# Patient Record
Sex: Female | Born: 1945 | Race: White | Hispanic: No | Smoking: Former smoker
Health system: Southern US, Community
[De-identification: ages and names within clinical notes are randomized; demographics above are authoritative.]

## PROBLEM LIST (undated history)

## (undated) DIAGNOSIS — G4730 Sleep apnea, unspecified: Secondary | ICD-10-CM

## (undated) DIAGNOSIS — M8580 Other specified disorders of bone density and structure, unspecified site: Secondary | ICD-10-CM

## (undated) DIAGNOSIS — K219 Gastro-esophageal reflux disease without esophagitis: Secondary | ICD-10-CM

## (undated) DIAGNOSIS — E785 Hyperlipidemia, unspecified: Secondary | ICD-10-CM

## (undated) DIAGNOSIS — C3491 Malignant neoplasm of unspecified part of right bronchus or lung: Secondary | ICD-10-CM

## (undated) DIAGNOSIS — R7301 Impaired fasting glucose: Secondary | ICD-10-CM

## (undated) DIAGNOSIS — R599 Enlarged lymph nodes, unspecified: Secondary | ICD-10-CM

## (undated) DIAGNOSIS — F339 Major depressive disorder, recurrent, unspecified: Secondary | ICD-10-CM

## (undated) DIAGNOSIS — Z8719 Personal history of other diseases of the digestive system: Secondary | ICD-10-CM

## (undated) DIAGNOSIS — J309 Allergic rhinitis, unspecified: Secondary | ICD-10-CM

## (undated) DIAGNOSIS — F419 Anxiety disorder, unspecified: Secondary | ICD-10-CM

## (undated) DIAGNOSIS — F17200 Nicotine dependence, unspecified, uncomplicated: Secondary | ICD-10-CM

## (undated) DIAGNOSIS — R7989 Other specified abnormal findings of blood chemistry: Secondary | ICD-10-CM

## (undated) DIAGNOSIS — Z7189 Other specified counseling: Secondary | ICD-10-CM

## (undated) DIAGNOSIS — E559 Vitamin D deficiency, unspecified: Secondary | ICD-10-CM

## (undated) DIAGNOSIS — J439 Emphysema, unspecified: Secondary | ICD-10-CM

## (undated) DIAGNOSIS — Z923 Personal history of irradiation: Secondary | ICD-10-CM

## (undated) DIAGNOSIS — I8393 Asymptomatic varicose veins of bilateral lower extremities: Secondary | ICD-10-CM

## (undated) HISTORY — DX: Nicotine dependence, unspecified, uncomplicated: F17.200

## (undated) HISTORY — PX: ABDOMINAL HYSTERECTOMY: SHX81

## (undated) HISTORY — DX: Allergic rhinitis, unspecified: J30.9

## (undated) HISTORY — PX: INCONTINENCE SURGERY: SHX676

## (undated) HISTORY — DX: Impaired fasting glucose: R73.01

## (undated) HISTORY — DX: Other specified counseling: Z71.89

## (undated) HISTORY — DX: Major depressive disorder, recurrent, unspecified: F33.9

## (undated) HISTORY — DX: Emphysema, unspecified: J43.9

## (undated) HISTORY — DX: Hyperlipidemia, unspecified: E78.5

## (undated) HISTORY — DX: Other specified disorders of bone density and structure, unspecified site: M85.80

## (undated) HISTORY — DX: Asymptomatic varicose veins of bilateral lower extremities: I83.93

## (undated) HISTORY — DX: Gastro-esophageal reflux disease without esophagitis: K21.9

## (undated) HISTORY — DX: Vitamin D deficiency, unspecified: E55.9

## (undated) HISTORY — DX: Other specified abnormal findings of blood chemistry: R79.89

---

## 2001-03-20 ENCOUNTER — Ambulatory Visit (HOSPITAL_COMMUNITY): Admission: RE | Admit: 2001-03-20 | Discharge: 2001-03-20 | Payer: Self-pay | Admitting: Internal Medicine

## 2001-12-02 ENCOUNTER — Ambulatory Visit (HOSPITAL_COMMUNITY): Admission: RE | Admit: 2001-12-02 | Discharge: 2001-12-02 | Payer: Self-pay | Admitting: Gastroenterology

## 2016-01-15 DIAGNOSIS — C3491 Malignant neoplasm of unspecified part of right bronchus or lung: Secondary | ICD-10-CM

## 2016-01-15 HISTORY — DX: Malignant neoplasm of unspecified part of right bronchus or lung: C34.91

## 2016-06-26 HISTORY — PX: OTHER SURGICAL HISTORY: SHX169

## 2016-08-20 ENCOUNTER — Encounter: Payer: Self-pay | Admitting: *Deleted

## 2016-08-20 ENCOUNTER — Ambulatory Visit (INDEPENDENT_AMBULATORY_CARE_PROVIDER_SITE_OTHER): Payer: Medicare Other | Admitting: Emergency Medicine

## 2016-08-20 ENCOUNTER — Ambulatory Visit (INDEPENDENT_AMBULATORY_CARE_PROVIDER_SITE_OTHER)
Admission: RE | Admit: 2016-08-20 | Discharge: 2016-08-20 | Disposition: A | Discharge status: Home / Self Care | Payer: Medicare Other | Source: Ambulatory Visit | Attending: Emergency Medicine | Admitting: Emergency Medicine

## 2016-08-20 ENCOUNTER — Other Ambulatory Visit: Payer: Self-pay | Admitting: *Deleted

## 2016-08-20 VITALS — BP 120/72 | HR 88 | Ht 65.0 in | Wt 131.0 lb

## 2016-08-20 DIAGNOSIS — C801 Malignant (primary) neoplasm, unspecified: Secondary | ICD-10-CM

## 2016-08-20 DIAGNOSIS — J80 Acute respiratory distress syndrome: Secondary | ICD-10-CM

## 2016-08-20 DIAGNOSIS — J439 Emphysema, unspecified: Secondary | ICD-10-CM | POA: Diagnosis not present

## 2016-08-20 DIAGNOSIS — J441 Chronic obstructive pulmonary disease with (acute) exacerbation: Secondary | ICD-10-CM | POA: Insufficient documentation

## 2016-08-20 DIAGNOSIS — C3411 Malignant neoplasm of upper lobe, right bronchus or lung: Secondary | ICD-10-CM | POA: Insufficient documentation

## 2016-08-20 DIAGNOSIS — J449 Chronic obstructive pulmonary disease, unspecified: Secondary | ICD-10-CM | POA: Insufficient documentation

## 2016-08-20 NOTE — Patient Instructions (Addendum)
We will perform a CXR today We will plan to repeat your CT scan of the chest in December 2018 We will refer you for pulmonary rehab  We will perform a walking oximetry today on RA Please continue your Spiriva daily Take albuterol 2 puffs up to every 4 hours if needed for shortness of breath.  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  Follow with Dr Lamonte Sakai in December after your CT chest to review together.

## 2016-08-20 NOTE — Assessment & Plan Note (Signed)
Diagnosis made with a 1.2 cm isolated right middle lobe nodule. The pathology clearly states that this was small cell, negative nodes and negative margins without any evidence of microscopic lymphatic or vascular invasion. Her notes from [facility] state that she had non-small cell lung cancer. I believe we need to have the slides sent for over read at [facility] to ensure that I am clear about the true diagnosis. I will refer her to see Dr. [name] in oncology given the results above I believe she likely only need surveillance but I would like his opinion regarding any role for adjuvant chemotherapy for small cell. I have ordered a repeat CT scan of the chest for December.

## 2016-08-20 NOTE — Addendum Note (Signed)
Addended by: Desmond Dike C on: 08/20/2016 02:21 PM   Modules accepted: Orders

## 2016-08-20 NOTE — Progress Notes (Signed)
Subjective:    Patient ID: Robyn Barton, female    DOB: March 28, 1945, 71 y.o.   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 She underwent a right middle lobectomy on 06/26/16 by Dr. Lianne Moris at [facility]. Negative station 7 and 11 R nodes, negative surgical margin. Also with a history of mild obstructive sleep apnea not on CPAP. Post-op she had to be admitted to [facility] for progressive dyspnea, treated for possible HCAP vs ARDS, mechanically ventilated, admitted for several weeks.   She is currently managed on Spiriva. Uses albuterol prn >> about 2x a day. She has had more dyspnea since the surgery and the rehospitailzation.  She was d/c on O2 1L/min, has not been using it.   FEV1 from a review of her notes for/20/18 FVC 2.35 L (80% predicted) FEV1 1.77 L (80% predicted), FEV1 to FVC ratio 76%  She is transferring her care to [facility].    Review of Systems  Constitutional: Negative for fever and unexpected weight change.  HENT: Negative for congestion, dental problem, ear pain, nosebleeds, postnasal drip, rhinorrhea, sinus pressure, sneezing, sore throat and trouble swallowing.   Eyes: Negative for redness and itching.  Respiratory: Positive for shortness of breath. Negative for cough, chest tightness and wheezing.   Cardiovascular: Negative for palpitations and leg swelling.  Gastrointestinal: Negative for nausea and vomiting.  Genitourinary: Negative for dysuria.  Musculoskeletal: Negative for joint swelling.  Skin: Negative for rash.  Neurological: Negative for headaches.  Hematological: Does not bruise/bleed easily.  Psychiatric/Behavioral: Negative for dysphoric mood. The patient is not nervous/anxious.     Past Medical History:  Diagnosis Date  . Allergic rhinitis   . Chest pain   . Elevated TSH   . Emphysema lung ([place])   . GERD  (gastroesophageal reflux disease)   . Hyperlipidemia   . Hypoxemia   . IFG (impaired fasting glucose)   . Non-small cell cancer of right lung ([place])   . OSA (obstructive sleep apnea)   . Osteopenia   . Pulmonary nodule   . Recurrent major depressive disorder ([place])   . Tobacco dependence   . Varicose veins of both lower extremities   . Vitamin D deficiency      No family history on file.   Social History   Social History  . Marital status: Divorced    Spouse name: N/A  . Number of children: N/A  . Years of education: N/A   Occupational History  . Not on file.   Social History Main Topics  . Smoking status: Former Smoker    Packs/day: 1.50    Years: 50.00    Types: Cigarettes    Quit date: 06/14/2016  . Smokeless tobacco: Never Used  . Alcohol use Not on file  . Drug use: Unknown  . Sexual activity: Not on file   Other Topics Concern  . Not on file   Social History Narrative  . No narrative on file     No Known Allergies   Outpatient Medications Prior to Visit  Medication Sig Dispense Refill  . albuterol (PROVENTIL HFA;VENTOLIN HFA) 108 (90 Base) MCG/ACT inhaler Inhale 2 puffs into the lungs every 6 (six) hours as needed for wheezing or shortness of breath.    Marland Kitchen albuterol (PROVENTIL) (2.5 MG/3ML) 0.083% nebulizer solution Inhale 2.5 mg into the lungs every 6 (six) hours  as needed.  11  . alendronate (FOSAMAX) 70 MG tablet Take 70 mg by mouth once a week. Take with a full glass of water on an empty stomach.    . Cholecalciferol (VITAMIN D3) 2000 units TABS Take 1 tablet by mouth daily.    . furosemide (LASIX) 20 MG tablet Take 20 mg by mouth daily.    Marland Kitchen omeprazole (PRILOSEC) 40 MG capsule Take 1 capsule by mouth daily.  3  . sertraline (ZOLOFT) 50 MG tablet Take 50 mg by mouth daily.    Marland Kitchen tiotropium (SPIRIVA) 18 MCG inhalation capsule Place 18 mcg into inhaler and inhale daily.     No facility-administered medications prior to visit.         Objective:    Physical Exam Vitals:   08/20/16 1337  BP: 120/72  Pulse: 88  SpO2: 97%  Weight: 131 lb (59.4 kg)  Height: 5\' 5"  (1.651 m)   Gen: Pleasant, well-nourished, in no distress,  normal affect  ENT: No lesions,  mouth clear,  oropharynx clear, no postnasal drip  Neck: No JVD, no stridor  Lungs: No use of accessory muscles, clear without rales or rhonchi  Cardiovascular: RRR, heart sounds normal, no murmur or gallops, no peripheral edema  Musculoskeletal: No deformities, no cyanosis or clubbing  Neuro: alert, non focal  Skin: Warm, no lesions or rashes       Assessment & Plan:  Small cell lung cancer in adult [facility]) Diagnosis made with a 1.2 cm isolated right middle lobe nodule. The pathology clearly states that this was small cell, negative nodes and negative margins without any evidence of microscopic lymphatic or vascular invasion. Her notes from [facility] state that she had non-small cell lung cancer. I believe we need to have the slides sent for over read at [facility] to ensure that I am clear about the true diagnosis. I will refer her to see Dr. [name] in oncology given the results above I believe she likely only need surveillance but I would like his opinion regarding any role for adjuvant chemotherapy for small cell. I have ordered a repeat CT scan of the chest for December.  COPD (chronic obstructive pulmonary disease) (HCC) FEV1 from a review of her notes for/20/18 FVC 2.35 L (80% predicted) FEV1 1.77 L (80% predicted), FEV1 to FVC ratio 76%. We will continue Spiriva and albuterol at this time. Walking oximetry today to confirm that she does not still need submental oxygen. I believe she would benefit from a referral for pulmonary rehabilitation  ARDS (adult respiratory distress syndrome) ([place]) She was admitted to [facility] for proximally one month after her hospitalization for right middle lobeectomy. I don't have these records available at this time but  it sounds like this was for ARDS. Certainly if that is the case then it's not surprising that she hasn't returned to her previous baseline. I will check a chest x-ray today. Get the records from [facility]. Refer her for pulmonary rehabilitation  Baltazar Apo, MD, PhD 08/20/2016, 2:18 PM [place] [facility] [phone] or if no answer [phone]

## 2016-08-20 NOTE — Assessment & Plan Note (Signed)
She was admitted to [facility] for proximally one month after her hospitalization for right middle lobeectomy. I don't have these records available at this time but it sounds like this was for ARDS. Certainly if that is the case then it's not surprising that she hasn't returned to her previous baseline. I will check a chest x-ray today. Get the records from [facility]. Refer her for pulmonary rehabilitation

## 2016-08-20 NOTE — Assessment & Plan Note (Signed)
FEV1 from a review of her notes for/20/18 FVC 2.35 L (80% predicted) FEV1 1.77 L (80% predicted), FEV1 to FVC ratio 76%. We will continue Spiriva and albuterol at this time. Walking oximetry today to confirm that she does not still need submental oxygen. I believe she would benefit from a referral for pulmonary rehabilitation

## 2016-08-22 ENCOUNTER — Telehealth: Payer: Self-pay | Admitting: Emergency Medicine

## 2016-08-22 DIAGNOSIS — J449 Chronic obstructive pulmonary disease, unspecified: Secondary | ICD-10-CM

## 2016-08-22 NOTE — Telephone Encounter (Signed)
Pt is requesting order for O2 to be signed ASAP- her billing cycle starts again on 8/13 (monday) and needs this order signed today so her O2 can be picked up tomorrow.  RB is out of town so he cannot sign his order.   Sending to [place] please advise if we can place order under your name to sign asap.  O2 was d/c'ed in yesterday's office visit.  Thanks!

## 2016-08-22 NOTE — Telephone Encounter (Signed)
Per CY- ok to order under his name. Order placed with urgent status to expedite this process for pt.  Routing back to CY to sign order.

## 2016-08-22 NOTE — Telephone Encounter (Signed)
done

## 2016-08-22 NOTE — Telephone Encounter (Signed)
Called office at number provided and was told no [place] works at there office. Verified with the front staff that this was the name given and they stated that this was correct. Called office back and they do not have any phone records of anyone requesting records or notes that someone was trying to call us. Will close this message as nothing further can be done

## 2016-08-23 ENCOUNTER — Telehealth: Payer: Self-pay | Admitting: *Deleted

## 2016-08-23 ENCOUNTER — Encounter: Payer: Self-pay | Admitting: Internal Medicine

## 2016-08-30 ENCOUNTER — Other Ambulatory Visit: Payer: Self-pay | Admitting: *Deleted

## 2016-08-30 DIAGNOSIS — C3490 Malignant neoplasm of unspecified part of unspecified bronchus or lung: Secondary | ICD-10-CM

## 2016-09-02 ENCOUNTER — Encounter: Payer: Self-pay | Admitting: Internal Medicine

## 2016-09-02 ENCOUNTER — Other Ambulatory Visit (HOSPITAL_BASED_OUTPATIENT_CLINIC_OR_DEPARTMENT_OTHER): Payer: Medicare Other

## 2016-09-02 ENCOUNTER — Telehealth: Payer: Self-pay | Admitting: Internal Medicine

## 2016-09-02 ENCOUNTER — Ambulatory Visit (HOSPITAL_BASED_OUTPATIENT_CLINIC_OR_DEPARTMENT_OTHER): Payer: Medicare Other | Admitting: Internal Medicine

## 2016-09-02 VITALS — BP 159/74 | HR 85 | Temp 97.7°F | Resp 19 | Ht 65.0 in | Wt 134.8 lb

## 2016-09-02 DIAGNOSIS — C3411 Malignant neoplasm of upper lobe, right bronchus or lung: Secondary | ICD-10-CM

## 2016-09-02 DIAGNOSIS — C3490 Malignant neoplasm of unspecified part of unspecified bronchus or lung: Secondary | ICD-10-CM

## 2016-09-02 DIAGNOSIS — J449 Chronic obstructive pulmonary disease, unspecified: Secondary | ICD-10-CM

## 2016-09-02 LAB — COMPREHENSIVE METABOLIC PANEL
ALBUMIN: 3.6 g/dL (ref 3.5–5.0)
ALK PHOS: 70 U/L (ref 40–150)
ALT: 15 U/L (ref 0–55)
ANION GAP: 5 meq/L (ref 3–11)
AST: 15 U/L (ref 5–34)
BILIRUBIN TOTAL: 0.27 mg/dL (ref 0.20–1.20)
BUN: 16.8 mg/dL (ref 7.0–26.0)
CO2: 27 meq/L (ref 22–29)
Calcium: 9.8 mg/dL (ref 8.4–10.4)
Chloride: 104 mEq/L (ref 98–109)
Creatinine: 0.9 mg/dL (ref 0.6–1.1)
EGFR: 60 mL/min/{1.73_m2} — AB (ref >90)
GLUCOSE: 98 mg/dL (ref 70–140)
POTASSIUM: 4.5 meq/L (ref 3.5–5.1)
SODIUM: 137 meq/L (ref 136–145)
Total Protein: 7.4 g/dL (ref 6.4–8.3)

## 2016-09-02 LAB — CBC WITH DIFFERENTIAL/PLATELET
BASO%: 0.1 % (ref 0.0–2.0)
BASOS ABS: 0 10*3/uL (ref 0.0–0.1)
EOS%: 1.9 % (ref 0.0–7.0)
Eosinophils Absolute: 0.1 10*3/uL (ref 0.0–0.5)
HCT: 37.5 % (ref 34.8–46.6)
HGB: 12.3 g/dL (ref 11.6–15.9)
LYMPH%: 30.4 % (ref 14.0–49.7)
MCH: 29.6 pg (ref 25.1–34.0)
MCHC: 32.8 g/dL (ref 31.5–36.0)
MCV: 90.1 fL (ref 79.5–101.0)
MONO#: 0.8 10*3/uL (ref 0.1–0.9)
MONO%: 10.9 % (ref 0.0–14.0)
NEUT#: 4.2 10*3/uL (ref 1.5–6.5)
NEUT%: 56.7 % (ref 38.4–76.8)
PLATELETS: 249 10*3/uL (ref 145–400)
RBC: 4.16 10*6/uL (ref 3.70–5.45)
RDW: 14.6 % — ABNORMAL HIGH (ref 11.2–14.5)
WBC: 7.4 10*3/uL (ref 3.9–10.3)
lymph#: 2.3 10*3/uL (ref 0.9–3.3)

## 2016-09-02 NOTE — Telephone Encounter (Signed)
Gave pt avs and calendar with upcoming appts.

## 2016-09-02 NOTE — Progress Notes (Signed)
[place] [phone]   [phone]  CONSULT NOTE  REFERRING PHYSICIAN: Dr. Baltazar Apo  REASON FOR CONSULTATION:  71 years old white female recently diagnosed with lung cancer.  HPI Robyn Barton is a 71 y.o. female was past medical history significant for COPD, depression, dyslipidemia, bladder surgery as well as recent history of lung cancer status post right upper lobectomy. The patient mentioned that she had a pulmonary nodule in the right lung that has been followed by CT scan at annual basis. She had repeat CT scan of the chest on 04/05/2016 and it showed increase in the size of the right middle lobe pulmonary nodule which measured 1.2 cm at that time suspicious for primary bronchogenic carcinoma. Her initial evaluation was at the [facility]. She had a PET scan on 05/14/2016 and it showed hypermetabolic right middle lobe nodule consistent with primary lung neoplasm and measuring 1.3 cm. The patient was referred to Dr. Janey Genta at [facility] and she underwent right upper lobectomy on 06/26/2016 and the final pathology was consistent with small cell lung cancer measuring 1.6 cm in size. There was evidence for visceral pleural invasion but the dissected lymph nodes were negative for malignancy.  According to the patient she was not seen by medical oncology to discuss her future treatment options and management.  She was seen recently by her pulmonologist Dr. Lamonte Sakai and he kindly referred the patient to me today for reevaluation and discussion of her management for the recently diagnosed small cell lung cancer.  When seen today the patient is feeling fine except for shortness breath with exertion as well as intermittent sharp pain on the right side of the chest close to the breast area. This is started 2 weeks ago. She denied having any cough or hemoptysis. She denied having any weight loss or night sweats. She has no nausea,  vomiting, diarrhea or constipation. She denied having any headache or visual changes.  Family history significant for a father died from old age at age 26, mother died at age 45.  The patient is divorced and has 2 children. 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 She has a history of smoking 1.5 pack per day for around 50 years and quit in May 2018. She drinks alcohol occasionally and no history of drug abuse.  HPI  Past Medical History:  Diagnosis Date  . Allergic rhinitis   . Chest pain   . Elevated TSH   . Emphysema lung ([place])   . GERD (gastroesophageal reflux disease)   . Hyperlipidemia   . Hypoxemia   . IFG (impaired fasting glucose)   . Non-small cell cancer of right lung ([place])   . OSA (obstructive sleep apnea)   . Osteopenia   . Pulmonary nodule   . Recurrent major depressive disorder ([place])   . Tobacco dependence   . Varicose veins of both lower extremities   . Vitamin D deficiency     History reviewed. No pertinent surgical history.  History reviewed. No pertinent family history.  Social History Social History  Substance Use Topics  . Smoking status: Former Smoker    Packs/day: 1.50    Years: 50.00    Types: Cigarettes    Quit date: 06/14/2016  . Smokeless tobacco: Never Used  . Alcohol use Not on file    No Known Allergies  Current Outpatient Prescriptions  Medication Sig Dispense Refill  . albuterol (PROVENTIL HFA;VENTOLIN  HFA) 108 (90 Base) MCG/ACT inhaler Inhale 2 puffs into the lungs every 6 (six) hours as needed for wheezing or shortness of breath.    Marland Kitchen alendronate (FOSAMAX) 70 MG tablet Take 70 mg by mouth once a week. Take with a full glass of water on an empty stomach.    Marland Kitchen atorvastatin (LIPITOR) 40 MG tablet Take 40 mg by mouth daily.    . Cholecalciferol (VITAMIN D) 2000 units tablet Take 2,000 Units by mouth daily.    . furosemide (LASIX) 20 MG tablet Take 20 mg by mouth daily.    Marland Kitchen omeprazole (PRILOSEC) 40 MG capsule Take  1 capsule by mouth daily.  3  . sertraline (ZOLOFT) 50 MG tablet Take 50 mg by mouth daily.    Marland Kitchen tiotropium (SPIRIVA) 18 MCG inhalation capsule Place 18 mcg into inhaler and inhale daily.    Marland Kitchen albuterol (PROVENTIL) (2.5 MG/3ML) 0.083% nebulizer solution Inhale 2.5 mg into the lungs every 6 (six) hours as needed.  11   No current facility-administered medications for this visit.     Review of Systems  Constitutional: negative Eyes: negative Ears, nose, mouth, throat, and face: negative Respiratory: positive for dyspnea on exertion and pleurisy/chest pain Cardiovascular: negative Gastrointestinal: negative Genitourinary:negative Integument/breast: negative Hematologic/lymphatic: negative Musculoskeletal:negative Neurological: negative Behavioral/Psych: negative Endocrine: negative Allergic/Immunologic: negative  Physical Exam  GYI:RSWNI, healthy, no distress, well nourished, well developed and anxious SKIN: skin color, texture, turgor are normal, no rashes or significant lesions HEAD: Normocephalic, No masses, lesions, tenderness or abnormalities EYES: normal, PERRLA, Conjunctiva are pink and non-injected EARS: External ears normal, Canals clear OROPHARYNX:no exudate, no erythema and lips, buccal mucosa, and tongue normal  NECK: supple, no adenopathy, no JVD LYMPH:  no palpable lymphadenopathy, no hepatosplenomegaly BREAST:not examined LUNGS: clear to auscultation , and palpation HEART: regular rate & rhythm, no murmurs and no gallops ABDOMEN:abdomen soft, non-tender, normal bowel sounds and no masses or organomegaly BACK: Back symmetric, no curvature., No CVA tenderness EXTREMITIES:no joint deformities, effusion, or inflammation, no edema, no skin discoloration  NEURO: alert & oriented x 3 with fluent speech, no focal motor/sensory deficits  PERFORMANCE STATUS: ECOG 1  LABORATORY DATA: Lab Results  Component Value Date   WBC 7.4 09/02/2016   HGB 12.3 09/02/2016   HCT  37.5 09/02/2016   MCV 90.1 09/02/2016   PLT 249 09/02/2016      Chemistry      Component Value Date/Time   NA 137 09/02/2016 1327   K 4.5 09/02/2016 1327   CO2 27 09/02/2016 1327   BUN 16.8 09/02/2016 1327   CREATININE 0.9 09/02/2016 1327      Component Value Date/Time   CALCIUM 9.8 09/02/2016 1327   ALKPHOS 70 09/02/2016 1327   AST 15 09/02/2016 1327   ALT 15 09/02/2016 1327   BILITOT 0.27 09/02/2016 1327       RADIOGRAPHIC STUDIES: Dg Chest 2 View  Result Date: 08/20/2016 CLINICAL DATA:  Shortness of breath EXAM: CHEST  2 VIEW COMPARISON:  Chest radiograph 07/18/2016 FINDINGS: Postsurgical changes of right middle lobectomy. No focal airspace disease. Normal cardiomediastinal contours. Aeration is greatly improved compared to 07/18/2016. IMPRESSION: No active cardiopulmonary disease. Findings of prior right middle lobectomy. Electronically Signed   By: Ulyses Jarred M.D.   On: 08/20/2016 18:07    ASSESSMENT: This is a very pleasant 72 years old white female recently diagnosed with a stage IB (T2, N0, M0) small cell lung cancer presented with right upper lobe pulmonary nodule is status post  right upper lobectomy with lymph node dissection and the tumor measured 1.6 cm but it involved the visceral pleura. This was diagnosed in June 2018.   PLAN: I had a lengthy discussion with the patient today about her current disease stage, prognosis and treatment options. I explained to the patient that she has very aggressive disease compared to the other types of lung cancer. Also explained to the patient that the risk of recurrence is high with a small cell lung cancer. She received a very appropriate treatment with surgical resection. This was performed more than 2 months ago. I'm not sure if the patient would benefit from adjuvant systemic chemotherapy at this point taken into consideration the small size of her tumor and also the long period of time since her surgical resection. The patient  may not also handle aggressive systemic chemotherapy well with her current age and comorbidities. I recommended for her to continue on observation for now with close monitoring. I will arrange for the patient to have repeat CT scan of the chest as well as MRI of the brain performed next week to rule out any disease recurrence or metastatic disease to the brain since she did not have any imaging studies of the brain before her surgery. If her scans are negative, I would see her back for follow-up visit in December after repeating CT scan of the chest which was already scheduled by Dr. Lamonte Sakai. The patient agreed to the current plan. I strongly encouraged the patient to continue quitting smoking at this point. She was advised to call immediately if she has any concerning symptoms in the interval. The patient voices understanding of current disease status and treatment options and is in agreement with the current care plan.  All questions were answered. The patient knows to call the clinic with any problems, questions or concerns. We can certainly see the patient much sooner if necessary.  Thank you so much for allowing me to participate in the care of Robyn Barton. I will continue to follow up the patient with you and assist in her care.  I spent 40 minutes counseling the patient face to face. The total time spent in the appointment was 60 minutes.  Disclaimer: This note was dictated with voice recognition software. Similar sounding words can inadvertently be transcribed and may not be corrected upon review.   Robyn Barton September 02, 2016, 2:50 PM

## 2016-09-09 ENCOUNTER — Other Ambulatory Visit: Payer: Self-pay | Admitting: Internal Medicine

## 2016-09-09 DIAGNOSIS — C3490 Malignant neoplasm of unspecified part of unspecified bronchus or lung: Secondary | ICD-10-CM

## 2016-09-09 DIAGNOSIS — J449 Chronic obstructive pulmonary disease, unspecified: Secondary | ICD-10-CM

## 2016-09-12 ENCOUNTER — Ambulatory Visit (HOSPITAL_COMMUNITY): Payer: Medicare Other

## 2016-09-14 ENCOUNTER — Ambulatory Visit (HOSPITAL_BASED_OUTPATIENT_CLINIC_OR_DEPARTMENT_OTHER)
Admission: RE | Admit: 2016-09-14 | Discharge: 2016-09-14 | Disposition: A | Discharge status: Home / Self Care | Payer: Medicare Other | Source: Ambulatory Visit | Attending: Internal Medicine | Admitting: Internal Medicine

## 2016-09-14 ENCOUNTER — Encounter (HOSPITAL_BASED_OUTPATIENT_CLINIC_OR_DEPARTMENT_OTHER): Payer: Self-pay

## 2016-09-14 DIAGNOSIS — G319 Degenerative disease of nervous system, unspecified: Secondary | ICD-10-CM | POA: Diagnosis not present

## 2016-09-14 DIAGNOSIS — C3490 Malignant neoplasm of unspecified part of unspecified bronchus or lung: Secondary | ICD-10-CM

## 2016-09-14 DIAGNOSIS — R911 Solitary pulmonary nodule: Secondary | ICD-10-CM | POA: Insufficient documentation

## 2016-09-14 DIAGNOSIS — R590 Localized enlarged lymph nodes: Secondary | ICD-10-CM | POA: Insufficient documentation

## 2016-09-14 DIAGNOSIS — R9082 White matter disease, unspecified: Secondary | ICD-10-CM | POA: Insufficient documentation

## 2016-09-14 DIAGNOSIS — J449 Chronic obstructive pulmonary disease, unspecified: Secondary | ICD-10-CM | POA: Diagnosis not present

## 2016-09-14 DIAGNOSIS — I2510 Atherosclerotic heart disease of native coronary artery without angina pectoris: Secondary | ICD-10-CM | POA: Insufficient documentation

## 2016-09-14 DIAGNOSIS — I700 Atherosclerosis of aorta: Secondary | ICD-10-CM | POA: Insufficient documentation

## 2016-09-14 MED ORDER — GADOBENATE DIMEGLUMINE 529 MG/ML IV SOLN
10.0000 mL | Freq: Once | INTRAVENOUS | Status: AC | PRN
  Administered 2016-09-14: 10 mL via INTRAVENOUS

## 2016-09-14 MED ORDER — IOPAMIDOL (ISOVUE-300) INJECTION 61%
100.0000 mL | Freq: Once | INTRAVENOUS | Status: AC | PRN
Start: 2016-09-14 — End: 2016-09-14
  Administered 2016-09-14: 80 mL via INTRAVENOUS

## 2016-09-15 ENCOUNTER — Other Ambulatory Visit: Payer: Self-pay | Admitting: Internal Medicine

## 2016-09-15 DIAGNOSIS — C3490 Malignant neoplasm of unspecified part of unspecified bronchus or lung: Secondary | ICD-10-CM

## 2016-09-17 ENCOUNTER — Telehealth: Payer: Self-pay | Admitting: Internal Medicine

## 2016-09-17 NOTE — Telephone Encounter (Signed)
Scheduled appt per 9/2 sch message - left message with appt date and time and sent reminder letter in the mail [facility] to contact patient with PET schedule.

## 2016-09-19 ENCOUNTER — Telehealth: Payer: Self-pay | Admitting: Medical Oncology

## 2016-09-19 NOTE — Telephone Encounter (Signed)
Per [name] I told pt that the reason for the PET scan is  "New subcarinal mediastinal lymphadenopathy, worrisome for metastatic nodal recurrence. ".

## 2016-09-26 ENCOUNTER — Ambulatory Visit (HOSPITAL_COMMUNITY)
Admission: RE | Admit: 2016-09-26 | Discharge: 2016-09-26 | Disposition: A | Discharge status: Home / Self Care | Payer: Medicare Other | Source: Ambulatory Visit | Attending: Internal Medicine | Admitting: Internal Medicine

## 2016-09-26 ENCOUNTER — Encounter (HOSPITAL_COMMUNITY): Payer: Self-pay | Admitting: Radiology

## 2016-09-26 DIAGNOSIS — I700 Atherosclerosis of aorta: Secondary | ICD-10-CM | POA: Diagnosis not present

## 2016-09-26 DIAGNOSIS — R590 Localized enlarged lymph nodes: Secondary | ICD-10-CM | POA: Insufficient documentation

## 2016-09-26 DIAGNOSIS — C3490 Malignant neoplasm of unspecified part of unspecified bronchus or lung: Secondary | ICD-10-CM | POA: Diagnosis not present

## 2016-09-26 LAB — GLUCOSE, CAPILLARY: GLUCOSE-CAPILLARY: 132 mg/dL — AB (ref 65–99)

## 2016-09-26 MED ORDER — FLUDEOXYGLUCOSE F - 18 (FDG) INJECTION
7.7000 | Freq: Once | INTRAVENOUS | Status: AC | PRN
  Administered 2016-09-26: 7.7 via INTRAVENOUS

## 2016-10-01 ENCOUNTER — Encounter: Payer: Self-pay | Admitting: Internal Medicine

## 2016-10-01 ENCOUNTER — Telehealth: Payer: Self-pay | Admitting: Internal Medicine

## 2016-10-01 ENCOUNTER — Other Ambulatory Visit: Payer: Self-pay | Admitting: Medical Oncology

## 2016-10-01 ENCOUNTER — Ambulatory Visit (HOSPITAL_BASED_OUTPATIENT_CLINIC_OR_DEPARTMENT_OTHER): Payer: Medicare Other | Admitting: Internal Medicine

## 2016-10-01 VITALS — BP 161/73 | HR 83 | Temp 98.2°F | Resp 18 | Ht 65.0 in | Wt 142.8 lb

## 2016-10-01 DIAGNOSIS — C3490 Malignant neoplasm of unspecified part of unspecified bronchus or lung: Secondary | ICD-10-CM

## 2016-10-01 DIAGNOSIS — C3411 Malignant neoplasm of upper lobe, right bronchus or lung: Secondary | ICD-10-CM

## 2016-10-01 DIAGNOSIS — J449 Chronic obstructive pulmonary disease, unspecified: Secondary | ICD-10-CM

## 2016-10-01 NOTE — Progress Notes (Signed)
[place] [phone]   [phone]  OFFICE PROGRESS NOTE  [name], MD [address] 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  PRIOR THERAPY: right upper lobectomy on 06/26/2016 and the final pathology was consistent with small cell lung cancer measuring 1.6 cm in size.  CURRENT THERAPY: Observation.  INTERVAL HISTORY: Robyn Barton 71 y.o. female returns to the clinic today for follow-up visit. The patient is feeling fine today. She was complaining of increasing fatigue and weakness recently secondary to urinary tract infection. She is finishing a course of antibiotics for her infection. She denied having any chest pain, shortness of breath, cough or hemoptysis. She denied having any fever or chills. She has no nausea, vomiting, diarrhea or constipation. The patient denied having any significant weight loss or night sweats. She had repeat imaging studies recently including CT scan of the chest that showed questionable disease recurrence in the subcarinal area. I ordered a PET scan which was performed recently and the patient is here today for evaluation and discussion of her PET scan results and treatment options.  MEDICAL HISTORY: Past Medical History:  Diagnosis Date  . Allergic rhinitis   . Chest pain   . Elevated TSH   . Emphysema lung ([place])   . GERD (gastroesophageal reflux disease)   . Hyperlipidemia   . Hypoxemia   . IFG (impaired fasting glucose)   . Non-small cell cancer of right lung ([place])   . OSA (obstructive sleep apnea)   . Osteopenia   . Pulmonary nodule   . Recurrent major depressive disorder ([place])   . Tobacco dependence   . Varicose veins of both lower extremities   . Vitamin D deficiency     ALLERGIES:  has No Known Allergies.  MEDICATIONS:  Current Outpatient  Prescriptions  Medication Sig Dispense Refill  . albuterol (PROVENTIL HFA;VENTOLIN HFA) 108 (90 Base) MCG/ACT inhaler Inhale 2 puffs into the lungs every 6 (six) hours as needed for wheezing or shortness of breath.    Marland Kitchen albuterol (PROVENTIL) (2.5 MG/3ML) 0.083% nebulizer solution Inhale 2.5 mg into the lungs every 6 (six) hours as needed.  11  . alendronate (FOSAMAX) 70 MG tablet Take 70 mg by mouth once a week. Take with a full glass of water on an empty stomach.    Marland Kitchen atorvastatin (LIPITOR) 40 MG tablet Take 40 mg by mouth daily.    . cefUROXime (CEFTIN) 250 MG tablet Take 250 mg by mouth.    . Cholecalciferol (VITAMIN D) 2000 units tablet Take 2,000 Units by mouth daily.    Marland Kitchen omeprazole (PRILOSEC) 40 MG capsule Take 1 capsule by mouth daily.  3  . sertraline (ZOLOFT) 50 MG tablet Take 100 mg by mouth daily.     Marland Kitchen tiotropium (SPIRIVA) 18 MCG inhalation capsule Place 18 mcg into inhaler and inhale daily.     No current facility-administered medications for this visit.     SURGICAL HISTORY: History reviewed. No pertinent surgical history.  REVIEW OF SYSTEMS:  Constitutional: negative Eyes: negative Ears, nose, mouth, throat, and face: negative Respiratory: negative Cardiovascular: negative Gastrointestinal: negative Genitourinary:negative Integument/breast: negative Hematologic/lymphatic: negative Musculoskeletal:negative Neurological: negative Behavioral/Psych: negative Endocrine: negative Allergic/Immunologic: negative   PHYSICAL EXAMINATION: General appearance: alert, cooperative, fatigued and no distress Head: Normocephalic, without obvious abnormality, atraumatic Neck: no adenopathy, no JVD, supple, symmetrical,  trachea midline and thyroid not enlarged, symmetric, no tenderness/mass/nodules Lymph nodes: Cervical, supraclavicular, and axillary nodes normal. Resp: clear to auscultation bilaterally Back: symmetric, no curvature. ROM normal. No CVA tenderness. Cardio: regular  rate and rhythm, S1, S2 normal, no murmur, click, rub or gallop GI: soft, non-tender; bowel sounds normal; no masses,  no organomegaly Extremities: extremities normal, atraumatic, no cyanosis or edema Neurologic: Alert and oriented X 3, normal strength and tone. Normal symmetric reflexes. Normal coordination and gait  ECOG PERFORMANCE STATUS: 1 - Symptomatic but completely ambulatory  Blood pressure (!) 161/73, pulse 83, temperature 98.2 F (36.8 C), temperature source Oral, resp. rate 18, height 5\' 5"  (1.651 m), weight 142 lb 12.8 oz (64.8 kg), SpO2 98 %.  LABORATORY DATA: Lab Results  Component Value Date   WBC 7.4 09/02/2016   HGB 12.3 09/02/2016   HCT 37.5 09/02/2016   MCV 90.1 09/02/2016   PLT 249 09/02/2016      Chemistry      Component Value Date/Time   NA 137 09/02/2016 1327   K 4.5 09/02/2016 1327   CO2 27 09/02/2016 1327   BUN 16.8 09/02/2016 1327   CREATININE 0.9 09/02/2016 1327      Component Value Date/Time   CALCIUM 9.8 09/02/2016 1327   ALKPHOS 70 09/02/2016 1327   AST 15 09/02/2016 1327   ALT 15 09/02/2016 1327   BILITOT 0.27 09/02/2016 1327       RADIOGRAPHIC STUDIES: Ct Chest W Contrast  Result Date: 09/14/2016 CLINICAL DATA:  Status post right middle lobectomy 06/26/2016 for stage IB small cell lung carcinoma. Restaging. EXAM: CT CHEST WITH CONTRAST TECHNIQUE: Multidetector CT imaging of the chest was performed during intravenous contrast administration. CONTRAST:  50mL ISOVUE-300 IOPAMIDOL (ISOVUE-300) INJECTION 61% COMPARISON:  07/05/2016 chest CT.  08/20/2016 chest radiograph. FINDINGS: Cardiovascular: Normal heart size. No significant pericardial fluid/thickening. Left anterior descending coronary atherosclerosis. Atherosclerotic nonaneurysmal thoracic aorta. Normal caliber pulmonary arteries. No central pulmonary emboli. Mediastinum/Nodes: No discrete thyroid nodules. Unremarkable esophagus. No axillary adenopathy. New enlarged 1.9 cm subcarinal node  (series 2/ image 66). Mildly enlarged 1.3 cm AP window node (series 2/ image 52), stable. No additional pathologically enlarged mediastinal or hilar nodes. Lungs/Pleura: Status post right middle lobectomy. No pneumothorax. No pleural effusion. Mild centrilobular and paraseptal emphysema with mild diffuse bronchial wall thickening. Posterior right upper lobe sub solid subpleural 1.1 x 0.4 cm pulmonary nodule (series 3/ image 52), obscured by consolidation on 07/05/2016 chest CT. Relatively symmetric subpleural biapical reticulonodular opacities most compatible with pleural-parenchymal scarring. No acute consolidative airspace disease, lung masses or additional significant pulmonary nodules. Previously visualized extensive consolidation, ground-glass attenuation and interlobular septal thickening on 07/05/2016 chest CT has resolved. Upper abdomen: No acute abnormality.  No discrete adrenal nodules. Musculoskeletal: No aggressive appearing focal osseous lesions. Moderate thoracic spondylosis. IMPRESSION: 1. New subcarinal mediastinal lymphadenopathy, worrisome for metastatic nodal recurrence. Consider further characterization with PET-CT and/or tissue sampling. 2. Right middle lobectomy. Subsolid subpleural posterior right upper lobe 1.1 x 0.4 cm pulmonary nodule, obscured by consolidation on prior chest CT, recurrent malignancy not excluded. Attention recommended on short-term follow-up chest CT in 3 months for this finding. 3. One vessel coronary atherosclerosis. Aortic Atherosclerosis (ICD10-I70.0) and Emphysema (ICD10-J43.9). Electronically Signed   By: Ilona Sorrel M.D.   On: 09/14/2016 14:50   Mr Jeri Cos OZ Contrast  Result Date: 09/14/2016 CLINICAL DATA:  Small cell lung cancer diagnosed 6 months ago. COPD. EXAM: MRI HEAD WITHOUT AND WITH CONTRAST TECHNIQUE: Multiplanar, multiecho pulse sequences  of the brain and surrounding structures were obtained without and with intravenous contrast. CONTRAST:  38mL  MULTIHANCE GADOBENATE DIMEGLUMINE 529 MG/ML IV SOLN COMPARISON:  None. FINDINGS: Brain: The diffusion-weighted images demonstrate no acute or subacute infarction. Periventricular and subcortical T2 changes bilaterally are mildly advanced for age. Mild generalized atrophy is present. The postcontrast images demonstrate no pathologic enhancement to suggest metastatic disease of the brain or spine. Vascular: Flow is present in the major intracranial arteries. Skull and upper cervical spine: The skullbase is within normal limits. The craniocervical junction is normal. Midline sagittal structures are unremarkable. Marrow signal is normal. Sinuses/Orbits: A polyp or mucous retention cyst is present along the inferior aspect of the right maxillary sinus. The paranasal sinuses and mastoid air cells are otherwise clear. Bilateral lens replacements are noted. The globes and orbits are otherwise within normal limits. IMPRESSION: 1. No evidence for metastatic disease to the brain. 2. Atrophy and white matter changes are mildly advanced for age. This likely reflects the sequela of chronic microvascular ischemia. Electronically Signed   By: San Morelle M.D.   On: 09/14/2016 15:14   Nm Pet Image Restag (ps) Skull Base To Thigh  Result Date: 09/26/2016 CLINICAL DATA:  Subsequent treatment strategy for small cell lung cancer relapse. Assess treatment response. EXAM: NUCLEAR MEDICINE PET SKULL BASE TO THIGH TECHNIQUE: 7.7 mCi F-18 FDG was injected intravenously. Full-ring PET imaging was performed from the skull base to thigh after the radiotracer. CT data was obtained and used for attenuation correction and anatomic localization. FASTING BLOOD GLUCOSE:  Value: 132 mg/dl COMPARISON:  Chest CT 09/14/2016. PET-CT 05/14/2016. Multiple other priors. FINDINGS: NECK: No hypermetabolic lymph nodes in the neck. CHEST: Previously noted enlarged subcarinal lymph nodes remain enlarged (20 mm in short axis) and are hypermetabolic  (SUVmax = 4.9). No other mediastinal or hilar lymphadenopathy is noted. No suspicious appearing pulmonary nodules or masses are identified. Postoperative changes of right middle lobectomy and wedge resection in the inferior aspect of the right upper lobe are again noted. No acute consolidative airspace disease. No pleural effusions. Atherosclerotic calcifications in the thoracic aorta. No definite coronary artery calcifications. Small hiatal hernia. ABDOMEN/PELVIS: No abnormal hypermetabolic activity within the liver, pancreas, adrenal glands, or spleen. No hypermetabolic lymph nodes in the abdomen or pelvis. Aortic atherosclerosis. SKELETON: No focal hypermetabolic activity to suggest skeletal metastasis. IMPRESSION: 1. Hypermetabolic subcarinal lymphadenopathy concerning for recurrent disease in this patient with history of small cell carcinoma of the lung. 2. No other sites of metastatic disease identified elsewhere in the neck, chest, abdomen or pelvis. 3. Aortic atherosclerosis. 4. Additional incidental findings, as above. Aortic Atherosclerosis (ICD10-I70.0). Electronically Signed   By: Vinnie Langton M.D.   On: 09/26/2016 15:38    ASSESSMENT AND PLAN: This is a very pleasant 71 years old white female with history of limited stage small cell lung cancer status post left upper lobectomy in June 2018. The patient had recent imaging studies including CT scan of the chest as well as a PET scan that showed concerning findings for disease recurrence with hypermetabolic subcarinal lymphadenopathy. I personally and independently reviewed the scan images and discuss the results with the patient today. I recommended for the patient to see Dr. Lamonte Sakai for reevaluation and consideration of bronchoscopy with endobronchial ultrasound and biopsy if feasible. If the subcarinal lymph node cannot be approached with this procedure, I would consider referring the patient to Dr. Ardis Hughs with gastroenterology for consideration  of endoscopic ultrasound and biopsy. I will arrange for the  patient to come back for follow-up visit in 3 weeks for evaluation and discussion of her treatment options based on the final biopsy results. For hypertension, the patient will continue with her current blood pressure medication and they recommended for her to monitor her blood pressure closely at home. The patient was advised to call immediately if she has any concerning symptoms in the interval. The patient voices understanding of current disease status and treatment options and is in agreement with the current care plan.  All questions were answered. The patient knows to call the clinic with any problems, questions or concerns. We can certainly see the patient much sooner if necessary.  Disclaimer: This note was dictated with voice recognition software. Similar sounding words can inadvertently be transcribed and may not be corrected upon review.

## 2016-10-01 NOTE — Telephone Encounter (Signed)
Scheduled appt per 9/18 los - Gave patient AVS and calender per los.

## 2016-10-02 ENCOUNTER — Telehealth: Payer: Self-pay | Admitting: Emergency Medicine

## 2016-10-02 DIAGNOSIS — R911 Solitary pulmonary nodule: Secondary | ICD-10-CM

## 2016-10-02 NOTE — Telephone Encounter (Signed)
Spoke with pt. She is aware of RB's response. Order has been placed for EBUS. Nothing further was needed.

## 2016-10-02 NOTE — Telephone Encounter (Signed)
Please let her know that I have reviewed the CT scan and the PET scan, and that I agree that she should undergo a bronchoscopy with EBUS to biopsy her enlarged lymph node.   Need to place order / arrange through [facility] for an EBUS. I could do this Monday 10/14/16 at [place] if they can arrange. Because I have a meeting, 8:30 am would be best time for me, but I could also do 7:30 if necessary.

## 2016-10-02 NOTE — Telephone Encounter (Signed)
RB please advise if we need to schedule the pt for a broch---the referral is from Dr. Mckinley Jewel and it states for recurrent small cell lung cancer for repeat biopsy.  Please advise so we can schedule if needed.

## 2016-10-03 ENCOUNTER — Telehealth: Payer: Self-pay | Admitting: Emergency Medicine

## 2016-10-03 NOTE — Telephone Encounter (Signed)
It typically takes a couple of weeks to get EBUS results back Called spoke with patient and informed her of this.  She is scheduled to see Dr [name] on 10.8.18 but he told her that if her results came back earlier than that, he would see her sooner.  She was calling to see if this would be possible.  Pt is aware that we share the same computer system with Dr [name] so when the results are back, he will have full access to those records.  Pt is also aware that she is welcome to call the office to check to see if her results are back early prior to her appt (but there is only a 1 week difference).  Pt voiced her understanding.  Nothing further needed; will sign off.

## 2016-10-08 ENCOUNTER — Ambulatory Visit: Payer: Medicare Other | Admitting: Internal Medicine

## 2016-10-14 ENCOUNTER — Encounter (HOSPITAL_COMMUNITY): Payer: Medicare Other

## 2016-10-14 ENCOUNTER — Telehealth: Payer: Self-pay | Admitting: Emergency Medicine

## 2016-10-14 ENCOUNTER — Encounter (HOSPITAL_COMMUNITY): Payer: Self-pay | Admitting: *Deleted

## 2016-10-14 NOTE — Telephone Encounter (Signed)
Spoke with pt. She has some questions about her EBUS. Pt wants to know if she have her flu shot today at her PCP and she would like to know how soon after the procedure can she drive. Per RB >> pt is fine to have her flu shot. She is aware of this information. Nothing further was needed.

## 2016-10-15 ENCOUNTER — Ambulatory Visit (HOSPITAL_COMMUNITY): Payer: Medicare Other | Admitting: Certified Registered Nurse Anesthetist

## 2016-10-15 ENCOUNTER — Ambulatory Visit (HOSPITAL_COMMUNITY)
Admission: RE | Admit: 2016-10-15 | Discharge: 2016-10-15 | Disposition: A | Discharge status: Home / Self Care | Payer: Medicare Other | Source: Ambulatory Visit | Attending: Emergency Medicine | Admitting: Emergency Medicine

## 2016-10-15 ENCOUNTER — Encounter (HOSPITAL_COMMUNITY)
Admission: RE | Disposition: A | Discharge status: Home / Self Care | Payer: Self-pay | Source: Ambulatory Visit | Attending: Emergency Medicine

## 2016-10-15 ENCOUNTER — Encounter (HOSPITAL_COMMUNITY): Payer: Self-pay | Admitting: *Deleted

## 2016-10-15 DIAGNOSIS — E785 Hyperlipidemia, unspecified: Secondary | ICD-10-CM | POA: Insufficient documentation

## 2016-10-15 DIAGNOSIS — C801 Malignant (primary) neoplasm, unspecified: Secondary | ICD-10-CM | POA: Insufficient documentation

## 2016-10-15 DIAGNOSIS — R590 Localized enlarged lymph nodes: Secondary | ICD-10-CM | POA: Diagnosis not present

## 2016-10-15 DIAGNOSIS — Z79899 Other long term (current) drug therapy: Secondary | ICD-10-CM | POA: Diagnosis not present

## 2016-10-15 DIAGNOSIS — F329 Major depressive disorder, single episode, unspecified: Secondary | ICD-10-CM | POA: Insufficient documentation

## 2016-10-15 DIAGNOSIS — K219 Gastro-esophageal reflux disease without esophagitis: Secondary | ICD-10-CM | POA: Insufficient documentation

## 2016-10-15 DIAGNOSIS — G4730 Sleep apnea, unspecified: Secondary | ICD-10-CM | POA: Insufficient documentation

## 2016-10-15 DIAGNOSIS — Z87891 Personal history of nicotine dependence: Secondary | ICD-10-CM | POA: Diagnosis not present

## 2016-10-15 DIAGNOSIS — E559 Vitamin D deficiency, unspecified: Secondary | ICD-10-CM | POA: Insufficient documentation

## 2016-10-15 DIAGNOSIS — C3490 Malignant neoplasm of unspecified part of unspecified bronchus or lung: Secondary | ICD-10-CM | POA: Diagnosis not present

## 2016-10-15 DIAGNOSIS — F419 Anxiety disorder, unspecified: Secondary | ICD-10-CM | POA: Insufficient documentation

## 2016-10-15 DIAGNOSIS — M8580 Other specified disorders of bone density and structure, unspecified site: Secondary | ICD-10-CM | POA: Diagnosis not present

## 2016-10-15 DIAGNOSIS — Z85118 Personal history of other malignant neoplasm of bronchus and lung: Secondary | ICD-10-CM | POA: Diagnosis not present

## 2016-10-15 DIAGNOSIS — C3411 Malignant neoplasm of upper lobe, right bronchus or lung: Secondary | ICD-10-CM | POA: Diagnosis present

## 2016-10-15 DIAGNOSIS — C771 Secondary and unspecified malignant neoplasm of intrathoracic lymph nodes: Secondary | ICD-10-CM | POA: Diagnosis not present

## 2016-10-15 HISTORY — DX: Enlarged lymph nodes, unspecified: R59.9

## 2016-10-15 HISTORY — DX: Personal history of other diseases of the digestive system: Z87.19

## 2016-10-15 HISTORY — DX: Anxiety disorder, unspecified: F41.9

## 2016-10-15 HISTORY — PX: ENDOBRONCHIAL ULTRASOUND: SHX5096

## 2016-10-15 HISTORY — DX: Malignant neoplasm of unspecified part of right bronchus or lung: C34.91

## 2016-10-15 HISTORY — DX: Sleep apnea, unspecified: G47.30

## 2016-10-15 SURGERY — ENDOBRONCHIAL ULTRASOUND (EBUS)
Anesthesia: General | Laterality: Bilateral

## 2016-10-15 MED ORDER — DEXAMETHASONE SODIUM PHOSPHATE 10 MG/ML IJ SOLN
INTRAMUSCULAR | Status: DC | PRN
  Administered 2016-10-15: 10 mg via INTRAVENOUS

## 2016-10-15 MED ORDER — ONDANSETRON HCL 4 MG/2ML IJ SOLN
INTRAMUSCULAR | Status: DC | PRN
  Administered 2016-10-15: 4 mg via INTRAVENOUS

## 2016-10-15 MED ORDER — FENTANYL CITRATE (PF) 100 MCG/2ML IJ SOLN
INTRAMUSCULAR | Status: AC
  Filled 2016-10-15: qty 2

## 2016-10-15 MED ORDER — ROCURONIUM BROMIDE 50 MG/5ML IV SOSY
PREFILLED_SYRINGE | INTRAVENOUS | Status: DC | PRN
  Administered 2016-10-15: 40 mg via INTRAVENOUS

## 2016-10-15 MED ORDER — LIDOCAINE 2% (20 MG/ML) 5 ML SYRINGE
INTRAMUSCULAR | Status: AC
  Filled 2016-10-15: qty 10

## 2016-10-15 MED ORDER — SUGAMMADEX SODIUM 200 MG/2ML IV SOLN
INTRAVENOUS | Status: AC
  Filled 2016-10-15: qty 2

## 2016-10-15 MED ORDER — LIDOCAINE 2% (20 MG/ML) 5 ML SYRINGE
INTRAMUSCULAR | Status: DC | PRN
  Administered 2016-10-15: 60 mg via INTRAVENOUS
  Administered 2016-10-15: 50 mg via INTRAVENOUS

## 2016-10-15 MED ORDER — ONDANSETRON HCL 4 MG/2ML IJ SOLN
INTRAMUSCULAR | Status: AC
  Filled 2016-10-15: qty 2

## 2016-10-15 MED ORDER — SUGAMMADEX SODIUM 200 MG/2ML IV SOLN
INTRAVENOUS | Status: DC | PRN
  Administered 2016-10-15: 200 mg via INTRAVENOUS

## 2016-10-15 MED ORDER — LACTATED RINGERS IV SOLN
INTRAVENOUS | Status: DC | PRN
  Administered 2016-10-15: 14:00:00 via INTRAVENOUS

## 2016-10-15 MED ORDER — LIDOCAINE 2% (20 MG/ML) 5 ML SYRINGE
INTRAMUSCULAR | Status: AC
  Filled 2016-10-15: qty 5

## 2016-10-15 MED ORDER — PHENYLEPHRINE 40 MCG/ML (10ML) SYRINGE FOR IV PUSH (FOR BLOOD PRESSURE SUPPORT)
PREFILLED_SYRINGE | INTRAVENOUS | Status: AC
  Filled 2016-10-15: qty 10

## 2016-10-15 MED ORDER — ARTIFICIAL TEARS OPHTHALMIC OINT
TOPICAL_OINTMENT | OPHTHALMIC | Status: AC
  Filled 2016-10-15: qty 3.5

## 2016-10-15 MED ORDER — ROCURONIUM BROMIDE 50 MG/5ML IV SOSY
PREFILLED_SYRINGE | INTRAVENOUS | Status: AC
  Filled 2016-10-15: qty 5

## 2016-10-15 MED ORDER — PROPOFOL 10 MG/ML IV BOLUS
INTRAVENOUS | Status: DC | PRN
Start: 2016-10-15 — End: 2016-10-15
  Administered 2016-10-15: 50 mg via INTRAVENOUS
  Administered 2016-10-15: 130 mg via INTRAVENOUS
  Administered 2016-10-15: 20 mg via INTRAVENOUS

## 2016-10-15 MED ORDER — PROPOFOL 10 MG/ML IV BOLUS
INTRAVENOUS | Status: AC
  Filled 2016-10-15: qty 20

## 2016-10-15 MED ORDER — FENTANYL CITRATE (PF) 100 MCG/2ML IJ SOLN
INTRAMUSCULAR | Status: DC | PRN
  Administered 2016-10-15 (×2): 50 ug via INTRAVENOUS

## 2016-10-15 MED ORDER — PHENYLEPHRINE 40 MCG/ML (10ML) SYRINGE FOR IV PUSH (FOR BLOOD PRESSURE SUPPORT)
PREFILLED_SYRINGE | INTRAVENOUS | Status: DC | PRN
  Administered 2016-10-15: 80 ug via INTRAVENOUS

## 2016-10-15 NOTE — H&P (Signed)
Robyn Barton is an 71 y.o. female.   Chief Complaint: mediastinal LAD HPI: 71 yo woman with hx tobacco, COPD, SCLCA dx on RML lobectomy 06/2016 at [facility]. She is followed by Dr [name] in oncology. CT chest 9/1 showed evolving mediastinal LAD. PET scan 9/13 showed that station 7 node was large and hypermetabolic. She presents today for nodal bx via EBUS.   Has some baseline cough w scan mucous, never hemoptysis. She describe some fleeting stabbing pain beneath her R breast. She understands the indications for nodal bx, risks and benefits of EBUS  Past Medical History:  Diagnosis Date  . Allergic rhinitis   . Anxiety   . Chest pain   . Elevated TSH    pt denies  . Emphysema lung ([place])   . Enlarged lymph nodes    in chest  . GERD (gastroesophageal reflux disease)   . History of hiatal hernia   . Hyperlipidemia   . Hypoxemia   . IFG (impaired fasting glucose)   . Osteopenia   . Pneumonia    several  times last 2009  . Pulmonary nodule   . Recurrent major depressive disorder ([place])   . Sleep apnea    mild no cpap needed  . Small cell carcinoma of right lung ([place]) 2018   surgery right middle lobe removed   . Tobacco dependence   . Varicose veins of both lower extremities   . Vitamin D deficiency     Past Surgical History:  Procedure Laterality Date  . ABDOMINAL HYSTERECTOMY     1 ovary left  . INCONTINENCE SURGERY    . right middle lobe lung surgery  06/26/2016   [facility]    History reviewed. No pertinent family history. Social History:  reports that she quit smoking about 4 months ago. Her smoking use included Cigarettes. She has a 75.00 pack-year smoking history. She has never used smokeless tobacco. She reports that she drinks alcohol. She reports that she does not use drugs.  Allergies: No Known Allergies  Medications Prior to Admission  Medication Sig Dispense Refill  . albuterol (PROVENTIL HFA;VENTOLIN HFA) 108 (90 Base) MCG/ACT inhaler Inhale 2 puffs into the lungs  every 6 (six) hours as needed for wheezing or shortness of breath.    Marland Kitchen alendronate (FOSAMAX) 70 MG tablet Take 70 mg by mouth every Monday. Take with a full glass of water on an empty stomach.     Marland Kitchen atorvastatin (LIPITOR) 40 MG tablet Take 40 mg by mouth at bedtime.     Marland Kitchen omeprazole (PRILOSEC) 40 MG capsule Take 40 mg by mouth daily.   3  . sertraline (ZOLOFT) 100 MG tablet Take 100 mg by mouth daily.     Marland Kitchen tiotropium (SPIRIVA) 18 MCG inhalation capsule Place 18 mcg into inhaler and inhale daily.    Marland Kitchen albuterol (PROVENTIL) (2.5 MG/3ML) 0.083% nebulizer solution Inhale 2.5 mg into the lungs every 6 (six) hours as needed for wheezing or shortness of breath.   11  . Cholecalciferol (VITAMIN D) 2000 units tablet Take 2,000 Units by mouth daily.    Marland Kitchen loratadine (CLARITIN) 10 MG tablet Take 10 mg by mouth daily as needed (hay fever).      No results found for this or any previous visit (from the past 48 hour(s)). No results found.  ROS As per HPI  Blood pressure (!) 168/68, pulse 74, temperature 98 F (36.7 C), temperature source Oral, resp. rate 13, height 5\' 5"  (1.651 m), weight 142 lb (  64.4 kg), SpO2 100 %. Physical Exam  Gen: Pleasant, well-nourished, in no distress,  normal affect  ENT: No lesions,  mouth clear,  oropharynx clear, no postnasal drip  Neck: No JVD, no stridor  Lungs: No use of accessory muscles, clear bilaterally  Cardiovascular: RRR, heart sounds normal, no murmur or gallops, no peripheral edema  Musculoskeletal: No deformities, no cyanosis or clubbing  Neuro: alert, non focal  Skin: Warm, no lesions or rashes  Assessment/Plan Hx SCLCA with new hypermetabolic mediastinal LAD. Probably metastatic disease. Planning for EBUS and nodal bx's today. Pt understands and agrees with the plan.   Collene Gobble., MD 10/15/2016, 1:28 PM

## 2016-10-15 NOTE — Discharge Instructions (Signed)
Flexible Bronchoscopy, Care After These instructions give you information on caring for yourself after your procedure. Your doctor may also give you more specific instructions. Call your doctor if you have any problems or questions after your procedure. Follow these instructions at home:  Do not eat or drink anything for 2 hours after your procedure. If you try to eat or drink before the medicine wears off, food or drink could go into your lungs. You could also burn yourself.  After 2 hours have passed and when you can cough and gag normally, you may eat soft food and drink liquids slowly.  The day after the test, you may eat your normal diet.  You may do your normal activities.  Keep all doctor visits. Get help right away if:  You get more and more short of breath.  You get light-headed.  You feel like you are going to pass out (faint).  You have chest pain.  You have new problems that worry you.  You cough up more than a little blood.  You cough up more blood than before. This information is not intended to replace advice given to you by your health care provider. Make sure you discuss any questions you have with your health care provider. Document Released: 10/28/2008 Document Revised: 06/08/2015 Document Reviewed: 09/04/2012 Elsevier Interactive Patient Education  2017 [place] FOR ANY QUESTIONS OR CONCERNS [phone]

## 2016-10-15 NOTE — Transfer of Care (Signed)
Immediate Anesthesia Transfer of Care Note  Patient: Robyn Barton  Procedure(s) Performed: Procedure(s): ENDOBRONCHIAL ULTRASOUND (Bilateral)  Patient Location: PACU  Anesthesia Type:General  Level of Consciousness:  sedated, patient cooperative and responds to stimulation  Airway & Oxygen Therapy:Patient Spontanous Breathing and Patient connected to face mask oxgen  Post-op Assessment:  Report given to PACU RN and Post -op Vital signs reviewed and stable  Post vital signs:  Reviewed and stable  Last Vitals:  Vitals:   10/15/16 1252 10/15/16 1455  BP: (!) 168/68 (!) 185/69  Pulse: 74 81  Resp: 13 12  Temp: 36.7 C 37.2 C  SpO2: 532% 023%    Complications: No apparent anesthesia complications

## 2016-10-15 NOTE — Anesthesia Procedure Notes (Signed)
Procedure Name: Intubation Date/Time: 10/15/2016 2:13 PM Performed by: Montel Clock Pre-anesthesia Checklist: Patient identified, Emergency Drugs available, Suction available, Patient being monitored and Timeout performed Patient Re-evaluated:Patient Re-evaluated prior to induction Oxygen Delivery Method: Circle system utilized Preoxygenation: Pre-oxygenation with 100% oxygen Induction Type: IV induction Ventilation: Mask ventilation without difficulty Laryngoscope Size: Mac and 3 Grade View: Grade II Tube type: Oral Tube size: 9.0 mm Number of attempts: 1 Airway Equipment and Method: Bougie stylet Placement Confirmation: ETT inserted through vocal cords under direct vision,  positive ETCO2 and breath sounds checked- equal and bilateral Secured at: 22 cm Tube secured with: Tape Dental Injury: Injury to lip  Comments: Grade 2/3 view. Lower cords visible with cricoid pressure. Unable to pass ETT easily. Bougie passed easily through cords and ETT advanced over bougie. L upper lip laceration noted and ointment applied.

## 2016-10-15 NOTE — Anesthesia Postprocedure Evaluation (Signed)
Anesthesia Post Note  Patient: Robyn Barton  Procedure(s) Performed: ENDOBRONCHIAL ULTRASOUND (Bilateral )     Patient location during evaluation: PACU Anesthesia Type: General Level of consciousness: awake and alert Pain management: pain level controlled Vital Signs Assessment: post-procedure vital signs reviewed and stable Respiratory status: spontaneous breathing, nonlabored ventilation, respiratory function stable and patient connected to nasal cannula oxygen Cardiovascular status: blood pressure returned to baseline and stable Postop Assessment: no apparent nausea or vomiting Anesthetic complications: no    Last Vitals:  Vitals:   10/15/16 1455 10/15/16 1500  BP: (!) 185/69 (!) 175/77  Pulse: 81 81  Resp: 12 10  Temp: 37.2 C   SpO2: 100% 100%    Last Pain:  Vitals:   10/15/16 1455  TempSrc: Oral                 [name] S

## 2016-10-15 NOTE — Anesthesia Preprocedure Evaluation (Addendum)
Anesthesia Evaluation  Patient identified by MRN, date of birth, ID band Patient awake    Reviewed: Allergy & Precautions, NPO status , Patient's Chart, lab work & pertinent test results  Airway Mallampati: II  TM Distance: >3 FB Neck ROM: Full    Dental  (+) Dental Advisory Given   Pulmonary sleep apnea , COPD,  COPD inhaler, former smoker,  S/p VATS and right middle lobectomy for Lung CA   breath sounds clear to auscultation       Cardiovascular negative cardio ROS   Rhythm:Regular Rate:Normal     Neuro/Psych    GI/Hepatic Neg liver ROS, hiatal hernia, GERD  ,  Endo/Other  negative endocrine ROS  Renal/GU negative Renal ROS     Musculoskeletal   Abdominal   Peds  Hematology negative hematology ROS (+)   Anesthesia Other Findings   Reproductive/Obstetrics                            Anesthesia Physical Anesthesia Plan  ASA: III  Anesthesia Plan: General   Post-op Pain Management:    Induction: Intravenous  PONV Risk Score and Plan: 3 and Ondansetron, Dexamethasone, Midazolam and Treatment may vary due to age or medical condition  Airway Management Planned: Oral ETT  Additional Equipment:   Intra-op Plan:   Post-operative Plan: Extubation in OR  Informed Consent: I have reviewed the patients History and Physical, chart, labs and discussed the procedure including the risks, benefits and alternatives for the proposed anesthesia with the patient or authorized representative who has indicated his/her understanding and acceptance.   Dental advisory given  Plan Discussed with: CRNA  Anesthesia Plan Comments:         Anesthesia Quick Evaluation

## 2016-10-15 NOTE — Op Note (Signed)
[facility] Cardiopulmonary Patient Name: Robyn Barton Procedure Date: 10/15/2016 MRN: [id number] Attending MD: Collene Gobble , MD Date of Birth: 19-Jul-1945 CSN: [id number] Age: 71 Admit Type: Inpatient Ethnicity: Not Hispanic or Latino Procedure:            Bronchoscopy Indications:          Sub-carina mass, Mediastinal adenopathy Providers:            Collene Gobble, MD, Andre Lefort RRT,RCP Referring MD:         Fanny Bien. [name] (Referring MD) Medicines:             Complications:        No immediate complications Estimated Blood Loss: Estimated blood loss: none. Procedure:      Pre-Anesthesia Assessment:      - A History and Physical has been performed. Patient meds and allergies       have been reviewed. The risks and benefits of the procedure and the       sedation options and risks were discussed with the patient. All       questions were answered and informed consent was obtained. Patient       identification and proposed procedure were verified prior to the       procedure by the physician in the pre-procedure area. Mental Status       Examination: alert and oriented. Airway Examination: small/crowded       oropharyngeal airway. Respiratory Examination: clear to auscultation. CV       Examination: RRR, no murmurs, no S3 or S4. ASA Grade Assessment: II - A       patient with mild systemic disease. After reviewing the risks and       benefits, the patient was deemed in satisfactory condition to undergo       the procedure. The anesthesia plan was to use general anesthesia.       Immediately prior to administration of medications, the patient was       re-assessed for adequacy to receive sedatives. The heart rate,       respiratory rate, oxygen saturations, blood pressure, adequacy of       pulmonary ventilation, and response to care were monitored throughout       the procedure. The physical status of the patient was re-assessed after       the  procedure.      After obtaining informed consent, the bronchoscope was passed under       direct vision. Throughout the procedure, the patient's blood pressure,       pulse, and oxygen saturations were monitored continuously. the [id number]       ( [id number]) scope was introduced through the mouth, via the endotracheal       tube and advanced to the tracheobronchial tree. The procedure was       accomplished without difficulty. The patient tolerated the procedure       well. Findings:      The endotracheal tube is in good position. The visualized portion of the       trachea is of normal caliber. The carina is sharp. The tracheobronchial       tree was examined to at least the first subsegmental level. Bronchial       mucosa and anatomy are normal; there are no endobronchial lesions, and       no secretions. The only abnormality was absence of the RML s/p prior  lobectomy. Stump looked normal.      Endobronchial ultrasound was used assist with guiding the biopsy needle       in the subcarinal area.      Transbronchial needle aspirations of a lesion were performed at the       carina (Station 7) using a Wang needle and sent for routine cytology.       Five samples were obtained. Transbronchial needle biopsies were then       performed at 10L node for cytology.      Rapid On-Site Evaluation (ROSE): Preliminary cytology of the lesion in       the subcarinal area was suggestive of small cell carcinoma (final       results are pending).      Estimated blood loss: none. Impression:      - Sub-carina mass      - Mediastinal adenopathy      - The examination was normal.      - A transbronchial needle aspiration was performed.      - Rapid On-Site Evaluation (ROSE): Preliminary cytology of the lesion in       the subcarinal area was suggestive of small cell carcinoma (final       results are pending).      - Endobronchial ultrasound was performed. Moderate Sedation:      Performed under  general anesthesia Recommendation:      - Await cytology results. Procedure Code(s):      --- Professional ---      [phone], Bronchoscopy, rigid or flexible, including fluoroscopic guidance,       when performed; with transbronchial needle aspiration biopsy(s),       trachea, main stem and/or lobar bronchus(i)      32919, Bronchoscopy, rigid or flexible, including fluoroscopic guidance,       when performed; with transendoscopic endobronchial ultrasound (EBUS)       during bronchoscopic diagnostic or therapeutic intervention(s) for       peripheral lesion(s) (List separately in addition to code for primary       procedure[s]) Diagnosis Code(s):      --- Professional ---      R91.8, Other nonspecific abnormal finding of lung field      R59.9, Enlarged lymph nodes, unspecified CPT copyright 2016 American Medical Association. All rights reserved. The codes documented in this report are preliminary and upon coder review may  be revised to meet current compliance requirements. Collene Gobble, MD Collene Gobble, MD 10/15/2016 2:56:15 PM Number of Addenda: 0 Scope In: Scope Out:

## 2016-10-16 ENCOUNTER — Telehealth: Payer: Self-pay | Admitting: Emergency Medicine

## 2016-10-16 ENCOUNTER — Encounter (HOSPITAL_COMMUNITY): Payer: Self-pay | Admitting: Emergency Medicine

## 2016-10-16 NOTE — Telephone Encounter (Signed)
Pt requesting yesterday's biopsy results, and requesting that Dr. Inda Merlin has access to the results.  I advised that the results are still processing and that we would call her as these results are completed. I also advised that we share a chart with Dr. Inda Merlin so he will have access to these results as well.   Pt expressed understanding.    RB please advise on results.  Thanks.

## 2016-10-17 ENCOUNTER — Ambulatory Visit: Payer: Medicare Other | Admitting: Emergency Medicine

## 2016-10-17 NOTE — Telephone Encounter (Signed)
RB please advise. Thanks.  

## 2016-10-17 NOTE — Telephone Encounter (Signed)
Reviewed the biopsy results with the patient. Her station 7 node was positive for small cell lung cancer. The 10 L node was normal lymph node tissue. She has a follow-up visit with Dr. [name] on 10/21/16 to discuss plans for treatment. I will forward this message to him FYI.

## 2016-10-18 NOTE — Telephone Encounter (Signed)
Returned call to pt confirmed appt on Monday 10/8 to review results. No further concerns.

## 2016-10-18 NOTE — Telephone Encounter (Signed)
Thank you for confirming the diagnosis. We will see her early next week.

## 2016-10-21 ENCOUNTER — Encounter: Payer: Self-pay | Admitting: Radiation Oncology

## 2016-10-21 ENCOUNTER — Other Ambulatory Visit (HOSPITAL_BASED_OUTPATIENT_CLINIC_OR_DEPARTMENT_OTHER): Payer: Medicare Other

## 2016-10-21 ENCOUNTER — Ambulatory Visit (HOSPITAL_BASED_OUTPATIENT_CLINIC_OR_DEPARTMENT_OTHER): Payer: Medicare Other | Admitting: Internal Medicine

## 2016-10-21 ENCOUNTER — Encounter: Payer: Self-pay | Admitting: *Deleted

## 2016-10-21 ENCOUNTER — Encounter: Payer: Self-pay | Admitting: Internal Medicine

## 2016-10-21 VITALS — BP 151/91 | HR 79 | Temp 98.3°F | Resp 18 | Ht 65.0 in | Wt 140.7 lb

## 2016-10-21 DIAGNOSIS — C3411 Malignant neoplasm of upper lobe, right bronchus or lung: Secondary | ICD-10-CM | POA: Diagnosis not present

## 2016-10-21 DIAGNOSIS — J449 Chronic obstructive pulmonary disease, unspecified: Secondary | ICD-10-CM

## 2016-10-21 DIAGNOSIS — Z5111 Encounter for antineoplastic chemotherapy: Secondary | ICD-10-CM | POA: Insufficient documentation

## 2016-10-21 DIAGNOSIS — C3490 Malignant neoplasm of unspecified part of unspecified bronchus or lung: Secondary | ICD-10-CM

## 2016-10-21 DIAGNOSIS — Z7189 Other specified counseling: Secondary | ICD-10-CM

## 2016-10-21 HISTORY — DX: Other specified counseling: Z71.89

## 2016-10-21 LAB — CBC WITH DIFFERENTIAL/PLATELET
BASO%: 0.3 % (ref 0.0–2.0)
Basophils Absolute: 0 10*3/uL (ref 0.0–0.1)
EOS%: 4.2 % (ref 0.0–7.0)
Eosinophils Absolute: 0.3 10*3/uL (ref 0.0–0.5)
HEMATOCRIT: 39.8 % (ref 34.8–46.6)
HEMOGLOBIN: 12.9 g/dL (ref 11.6–15.9)
LYMPH#: 1.6 10*3/uL (ref 0.9–3.3)
LYMPH%: 23.8 % (ref 14.0–49.7)
MCH: 29.3 pg (ref 25.1–34.0)
MCHC: 32.4 g/dL (ref 31.5–36.0)
MCV: 90.5 fL (ref 79.5–101.0)
MONO#: 0.9 10*3/uL (ref 0.1–0.9)
MONO%: 13.6 % (ref 0.0–14.0)
NEUT%: 58.1 % (ref 38.4–76.8)
NEUTROS ABS: 4 10*3/uL (ref 1.5–6.5)
Platelets: 254 10*3/uL (ref 145–400)
RBC: 4.4 10*6/uL (ref 3.70–5.45)
RDW: 14.3 % (ref 11.2–14.5)
WBC: 6.9 10*3/uL (ref 3.9–10.3)

## 2016-10-21 LAB — COMPREHENSIVE METABOLIC PANEL
ALBUMIN: 3.8 g/dL (ref 3.5–5.0)
ALK PHOS: 70 U/L (ref 40–150)
ALT: 21 U/L (ref 0–55)
ANION GAP: 7 meq/L (ref 3–11)
AST: 20 U/L (ref 5–34)
BUN: 19.2 mg/dL (ref 7.0–26.0)
CO2: 24 meq/L (ref 22–29)
Calcium: 9.2 mg/dL (ref 8.4–10.4)
Chloride: 108 mEq/L (ref 98–109)
Creatinine: 1.5 mg/dL — ABNORMAL HIGH (ref 0.6–1.1)
EGFR: 36 mL/min/{1.73_m2} — AB (ref >90)
GLUCOSE: 81 mg/dL (ref 70–140)
POTASSIUM: 4.6 meq/L (ref 3.5–5.1)
Sodium: 139 mEq/L (ref 136–145)
Total Bilirubin: 0.32 mg/dL (ref 0.20–1.20)
Total Protein: 7.4 g/dL (ref 6.4–8.3)

## 2016-10-21 MED ORDER — PROCHLORPERAZINE MALEATE 10 MG PO TABS: 10.0000 mg | ORAL_TABLET | Freq: Four times a day (QID) | ORAL | 0 refills | Status: DC | PRN

## 2016-10-21 NOTE — Progress Notes (Signed)
[facility] [phone]   [phone]  OFFICE PROGRESS NOTE  [name], MD [address] 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  PRIOR THERAPY: right upper lobectomy on 06/26/2016 and the final pathology was consistent with small cell lung cancer measuring 1.6 cm in size.  CURRENT THERAPY: systemic chemotherapy with cisplatin 60 MG/M2 on day 1 and etoposide 120 MG/M2 on days 1, 2 and 3 every 3 weeks.  INTERVAL HISTORY: Robyn Barton 71 y.o. female returns to the clinic today for follow-up visit. The patient is feeling fine today with no specific complaints exce for intermittent pain on the right side of the chest with radiation to the Center. She denied having any shortness of breath, cough or hemoptysis. She denied having any fever or chills. She has no nausea, vomiting, diarrhea or constipation. She denied having any recent weight loss or night sweats. The patient has no headache or visual changes. She was seen recently by Dr. Lamonte Sakai and underwent repeat bronchoscopy with endobronchial ultrasound and biopsy of level 7 lymph node showed malignant cells consistent with metastatic small cell undifferentiated carcinoma. The patient is here today for evaluation and discussion of her treatment options.   MEDICAL HISTORY: Past Medical History:  Diagnosis Date  . Allergic rhinitis   . Anxiety   . Chest pain   . Elevated TSH    pt denies  . Emphysema lung ([place])   . Enlarged lymph nodes    in chest  . GERD (gastroesophageal reflux disease)   . History of hiatal hernia   . Hyperlipidemia   . Hypoxemia   . IFG (impaired fasting glucose)   . Osteopenia   . Pneumonia    several  times last 2009  . Pulmonary nodule   . Recurrent major depressive disorder ([place])   . Sleep apnea    mild no cpap needed  . Small cell  carcinoma of right lung ([place]) 2018   surgery right middle lobe removed   . Tobacco dependence   . Varicose veins of both lower extremities   . Vitamin D deficiency     ALLERGIES:  has No Known Allergies.  MEDICATIONS:  Current Outpatient Prescriptions  Medication Sig Dispense Refill  . albuterol (PROVENTIL HFA;VENTOLIN HFA) 108 (90 Base) MCG/ACT inhaler Inhale 2 puffs into the lungs every 6 (six) hours as needed for wheezing or shortness of breath.    Marland Kitchen albuterol (PROVENTIL) (2.5 MG/3ML) 0.083% nebulizer solution Inhale 2.5 mg into the lungs every 6 (six) hours as needed for wheezing or shortness of breath.   11  . alendronate (FOSAMAX) 70 MG tablet Take 70 mg by mouth every Monday. Take with a full glass of water on an empty stomach.     Marland Kitchen atorvastatin (LIPITOR) 40 MG tablet Take 40 mg by mouth at bedtime.     . Cholecalciferol (VITAMIN D) 2000 units tablet Take 2,000 Units by mouth daily.    Marland Kitchen loratadine (CLARITIN) 10 MG tablet Take 10 mg by mouth daily as needed (hay fever).    Marland Kitchen omeprazole (PRILOSEC) 40 MG capsule Take 40 mg by mouth daily.   3  . sertraline (ZOLOFT) 100 MG tablet Take 100 mg by mouth daily.     Marland Kitchen tiotropium (SPIRIVA) 18 MCG inhalation capsule Place 18 mcg into inhaler and inhale daily.  No current facility-administered medications for this visit.     SURGICAL HISTORY:  Past Surgical History:  Procedure Laterality Date  . ABDOMINAL HYSTERECTOMY     1 ovary left  . ENDOBRONCHIAL ULTRASOUND Bilateral 10/15/2016   Procedure: ENDOBRONCHIAL ULTRASOUND;  Surgeon: Collene Gobble, MD;  Location: [facility] ENDOSCOPY;  Service: Cardiopulmonary;  Laterality: Bilateral;  . INCONTINENCE SURGERY    . right middle lobe lung surgery  06/26/2016   [facility]    REVIEW OF SYSTEMS:  Constitutional: negative Eyes: negative Ears, nose, mouth, throat, and face: negative Respiratory: positive for pleurisy/chest pain Cardiovascular: negative Gastrointestinal:  negative Genitourinary:negative Integument/breast: negative Hematologic/lymphatic: negative Musculoskeletal:negative Neurological: negative Behavioral/Psych: negative Endocrine: negative Allergic/Immunologic: negative   PHYSICAL EXAMINATION: General appearance: alert, cooperative, fatigued and no distress Head: Normocephalic, without obvious abnormality, atraumatic Neck: no adenopathy, no JVD, supple, symmetrical, trachea midline and thyroid not enlarged, symmetric, no tenderness/mass/nodules Lymph nodes: Cervical, supraclavicular, and axillary nodes normal. Resp: clear to auscultation bilaterally Back: symmetric, no curvature. ROM normal. No CVA tenderness. Cardio: regular rate and rhythm, S1, S2 normal, no murmur, click, rub or gallop GI: soft, non-tender; bowel sounds normal; no masses,  no organomegaly Extremities: extremities normal, atraumatic, no cyanosis or edema Neurologic: Alert and oriented X 3, normal strength and tone. Normal symmetric reflexes. Normal coordination and gait  ECOG PERFORMANCE STATUS: 1 - Symptomatic but completely ambulatory  Blood pressure (!) 151/91, pulse 79, temperature 98.3 F (36.8 C), temperature source Oral, resp. rate 18, height 5\' 5"  (1.651 m), weight 140 lb 11.2 oz (63.8 kg), SpO2 99 %.  LABORATORY DATA: Lab Results  Component Value Date   WBC 6.9 10/21/2016   HGB 12.9 10/21/2016   HCT 39.8 10/21/2016   MCV 90.5 10/21/2016   PLT 254 10/21/2016      Chemistry      Component Value Date/Time   NA 137 09/02/2016 1327   K 4.5 09/02/2016 1327   CO2 27 09/02/2016 1327   BUN 16.8 09/02/2016 1327   CREATININE 0.9 09/02/2016 1327      Component Value Date/Time   CALCIUM 9.8 09/02/2016 1327   ALKPHOS 70 09/02/2016 1327   AST 15 09/02/2016 1327   ALT 15 09/02/2016 1327   BILITOT 0.27 09/02/2016 1327       RADIOGRAPHIC STUDIES: Nm Pet Image Restag (ps) Skull Base To Thigh  Result Date: 09/26/2016 CLINICAL DATA:  Subsequent treatment  strategy for small cell lung cancer relapse. Assess treatment response. EXAM: NUCLEAR MEDICINE PET SKULL BASE TO THIGH TECHNIQUE: 7.7 mCi F-18 FDG was injected intravenously. Full-ring PET imaging was performed from the skull base to thigh after the radiotracer. CT data was obtained and used for attenuation correction and anatomic localization. FASTING BLOOD GLUCOSE:  Value: 132 mg/dl COMPARISON:  Chest CT 09/14/2016. PET-CT 05/14/2016. Multiple other priors. FINDINGS: NECK: No hypermetabolic lymph nodes in the neck. CHEST: Previously noted enlarged subcarinal lymph nodes remain enlarged (20 mm in short axis) and are hypermetabolic (SUVmax = 4.9). No other mediastinal or hilar lymphadenopathy is noted. No suspicious appearing pulmonary nodules or masses are identified. Postoperative changes of right middle lobectomy and wedge resection in the inferior aspect of the right upper lobe are again noted. No acute consolidative airspace disease. No pleural effusions. Atherosclerotic calcifications in the thoracic aorta. No definite coronary artery calcifications. Small hiatal hernia. ABDOMEN/PELVIS: No abnormal hypermetabolic activity within the liver, pancreas, adrenal glands, or spleen. No hypermetabolic lymph nodes in the abdomen or pelvis. Aortic atherosclerosis. SKELETON: No focal hypermetabolic activity to suggest  skeletal metastasis. IMPRESSION: 1. Hypermetabolic subcarinal lymphadenopathy concerning for recurrent disease in this patient with history of small cell carcinoma of the lung. 2. No other sites of metastatic disease identified elsewhere in the neck, chest, abdomen or pelvis. 3. Aortic atherosclerosis. 4. Additional incidental findings, as above. Aortic Atherosclerosis (ICD10-I70.0). Electronically Signed   By: Vinnie Langton M.D.   On: 09/26/2016 15:38    ASSESSMENT AND PLAN: This is a very pleasant 71 years old white female with history of limited stage small cell lung cancer status post left upper  lobectomy in June 2018. The patient had recent imaging studies including CT scan of the chest as well as a PET scan that showed concerning findings for disease recurrence with hypermetabolic subcarinal lymphadenopathy. She underwent repeat biopsy of the subcarinal lymph node and the final pathology was consistent with recurrent small cell carcinoma. I had a lengthy discussion with the patient today about her current disease is stage, prognosis and treatment options. I recommended for the patient a course of concurrent chemotherapy and radiation. Her systemic chemotherapy would be in the form of cisplatin 60 MG/M2 on day 1 and etoposide 120 MG/M2 on days 1, 2 and 3 very 3 weeks. I discussed with the patient adverse effect of this treatment including but not limited to alopecia, myelosuppression, nausea and vomiting, peripheral neuropathy, liver or renal dysfunction. I referred the patient to radiation oncology for consideration of concurrent radiotherapy. She is expected to start the first dose of this treatment on 10/29/2016. She will have a chemotherapy education class before the first dose of her treatment. The patient will come back for follow-up visit one week after the start of her treatment for reevaluation and management of any adverse effect of her treatment. She was advised to call immediately if she has any concerning symptoms in the interval. The patient voices understanding of current disease status and treatment options and is in agreement with the current care plan.  All questions were answered. The patient knows to call the clinic with any problems, questions or concerns. We can certainly see the patient much sooner if necessary.  Disclaimer: This note was dictated with voice recognition software. Similar sounding words can inadvertently be transcribed and may not be corrected upon review.

## 2016-10-21 NOTE — Progress Notes (Signed)
Oncology Nurse Navigator Documentation  Oncology Nurse Navigator Flowsheets 10/21/2016  Navigator Location [facility]  Navigator Encounter Type Clinic/MDC/spoke with patient today. She has disease progression and will be getting chemo and radiation. I gave and explained information on chemo.  Dr. [name] made referral to [place] [place]. I will updated Rad Onc scheduling team on referral.   Patient Visit Type MedOnc  Treatment Phase Pre-Tx/Tx Discussion  Barriers/Navigation Needs Coordination of Care;Education  Education Understanding Cancer/ Treatment Options  Interventions Coordination of Care;Education  Coordination of Care Other  Education Method Verbal;Written  Acuity Level 2  Acuity Level 2 Educational needs;Assistance expediting appointments  Time Spent with Patient 30

## 2016-10-21 NOTE — Progress Notes (Signed)
START ON PATHWAY REGIMEN - Small Cell Lung     A cycle is every 21 days:     Etoposide      Cisplatin   **Always confirm dose/schedule in your pharmacy ordering system**    Patient Characteristics: Limited Stage, First Line Stage Classification: Limited AJCC T Category: T1b AJCC N Category: N2 AJCC M Category: M0 AJCC 8 Stage Grouping: IIIA Line of therapy: First Line Would you be surprised if this patient died  in the next year<= I would be surprised if this patient died in the next year Intent of Therapy: Curative Intent, Discussed with Patient

## 2016-10-22 ENCOUNTER — Telehealth: Payer: Self-pay | Admitting: Internal Medicine

## 2016-10-22 ENCOUNTER — Encounter: Payer: Self-pay | Admitting: *Deleted

## 2016-10-22 NOTE — Telephone Encounter (Signed)
Called patient regarding October - December

## 2016-10-23 ENCOUNTER — Other Ambulatory Visit: Payer: Medicare Other

## 2016-10-23 ENCOUNTER — Encounter: Payer: Self-pay | Admitting: *Deleted

## 2016-10-23 ENCOUNTER — Encounter: Payer: Self-pay | Admitting: Internal Medicine

## 2016-10-28 ENCOUNTER — Telehealth: Payer: Self-pay | Admitting: Medical Oncology

## 2016-10-28 NOTE — Telephone Encounter (Signed)
Rx for wig. She will pick up on day of tx. Rx up front for pick up and pt notified.

## 2016-10-29 ENCOUNTER — Ambulatory Visit (HOSPITAL_BASED_OUTPATIENT_CLINIC_OR_DEPARTMENT_OTHER): Payer: Medicare Other

## 2016-10-29 ENCOUNTER — Other Ambulatory Visit (HOSPITAL_BASED_OUTPATIENT_CLINIC_OR_DEPARTMENT_OTHER): Payer: Medicare Other

## 2016-10-29 VITALS — BP 132/77 | HR 70 | Temp 98.6°F | Resp 18

## 2016-10-29 DIAGNOSIS — C3490 Malignant neoplasm of unspecified part of unspecified bronchus or lung: Secondary | ICD-10-CM

## 2016-10-29 DIAGNOSIS — Z5111 Encounter for antineoplastic chemotherapy: Secondary | ICD-10-CM

## 2016-10-29 DIAGNOSIS — J449 Chronic obstructive pulmonary disease, unspecified: Secondary | ICD-10-CM

## 2016-10-29 DIAGNOSIS — C3411 Malignant neoplasm of upper lobe, right bronchus or lung: Secondary | ICD-10-CM

## 2016-10-29 LAB — COMPREHENSIVE METABOLIC PANEL
ALBUMIN: 3.7 g/dL (ref 3.5–5.0)
ALK PHOS: 64 U/L (ref 40–150)
ALT: 17 U/L (ref 0–55)
AST: 16 U/L (ref 5–34)
Anion Gap: 10 mEq/L (ref 3–11)
BUN: 23.7 mg/dL (ref 7.0–26.0)
CALCIUM: 9.1 mg/dL (ref 8.4–10.4)
CHLORIDE: 107 meq/L (ref 98–109)
CO2: 24 mEq/L (ref 22–29)
CREATININE: 0.9 mg/dL (ref 0.6–1.1)
EGFR: >60 mL/min/{1.73_m2} (ref >60)
GLUCOSE: 103 mg/dL (ref 70–140)
Potassium: 4 mEq/L (ref 3.5–5.1)
SODIUM: 141 meq/L (ref 136–145)
Total Bilirubin: 0.22 mg/dL (ref 0.20–1.20)
Total Protein: 7.3 g/dL (ref 6.4–8.3)

## 2016-10-29 LAB — CBC WITH DIFFERENTIAL/PLATELET
BASO%: 0.2 % (ref 0.0–2.0)
BASOS ABS: 0 10*3/uL (ref 0.0–0.1)
EOS ABS: 0.2 10*3/uL (ref 0.0–0.5)
EOS%: 2 % (ref 0.0–7.0)
HEMATOCRIT: 38 % (ref 34.8–46.6)
HEMOGLOBIN: 12.5 g/dL (ref 11.6–15.9)
LYMPH%: 23.4 % (ref 14.0–49.7)
MCH: 29.1 pg (ref 25.1–34.0)
MCHC: 32.9 g/dL (ref 31.5–36.0)
MCV: 88.6 fL (ref 79.5–101.0)
MONO#: 0.8 10*3/uL (ref 0.1–0.9)
MONO%: 8.9 % (ref 0.0–14.0)
NEUT#: 5.8 10*3/uL (ref 1.5–6.5)
NEUT%: 65.5 % (ref 38.4–76.8)
Platelets: 249 10*3/uL (ref 145–400)
RBC: 4.29 10*6/uL (ref 3.70–5.45)
RDW: 14.1 % (ref 11.2–14.5)
WBC: 8.9 10*3/uL (ref 3.9–10.3)
lymph#: 2.1 10*3/uL (ref 0.9–3.3)

## 2016-10-29 MED ORDER — SODIUM CHLORIDE 0.9 % IV SOLN
Freq: Once | INTRAVENOUS | Status: AC
  Administered 2016-10-29: 09:00:00 via INTRAVENOUS

## 2016-10-29 MED ORDER — SODIUM CHLORIDE 0.9 % IV SOLN
100.0000 mg/m2 | Freq: Once | INTRAVENOUS | Status: AC
  Administered 2016-10-29: 170 mg via INTRAVENOUS
  Filled 2016-10-29: qty 8.5

## 2016-10-29 MED ORDER — PALONOSETRON HCL INJECTION 0.25 MG/5ML
0.2500 mg | Freq: Once | INTRAVENOUS | Status: AC
  Administered 2016-10-29: 0.25 mg via INTRAVENOUS

## 2016-10-29 MED ORDER — MANNITOL 25 % IV SOLN
Freq: Once | INTRAVENOUS | Status: AC
  Administered 2016-10-29: 09:00:00 via INTRAVENOUS
  Filled 2016-10-29: qty 10

## 2016-10-29 MED ORDER — SODIUM CHLORIDE 0.9 % IV SOLN
80.0000 mg/m2 | Freq: Once | INTRAVENOUS | Status: AC
  Administered 2016-10-29: 137 mg via INTRAVENOUS
  Filled 2016-10-29: qty 137

## 2016-10-29 MED ORDER — SODIUM CHLORIDE 0.9 % IV SOLN
Freq: Once | INTRAVENOUS | Status: AC
  Administered 2016-10-29: 11:00:00 via INTRAVENOUS
  Filled 2016-10-29: qty 5

## 2016-10-29 MED ORDER — PALONOSETRON HCL INJECTION 0.25 MG/5ML
INTRAVENOUS | Status: AC
  Filled 2016-10-29: qty 5

## 2016-10-29 NOTE — Progress Notes (Signed)
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 She underwent repeat biopsy of the subcarinal lymph node and the final pathology was consistent with recurrent small cell carcinoma.     Tobacco/Marijuana/Snuff/ETOH use: hx of tobacco use  Past/Anticipated interventions by cardiothoracic surgery, if any: right upper lobectomy on 06/26/16  Past/Anticipated interventions by medical oncology, if any: PRIOR THERAPY: right upper lobectomy on 06/26/2016 and the final pathology was consistent with small cell lung cancer measuring 1.6 cm in size.  CURRENT THERAPY: systemic chemotherapy with cisplatin 60 MG/M2 on day 1 and etoposide 120 MG/M2 on days 1, 2 and 3 every 3 weeks. She is expected to start the first dose of this treatment on 10/29/2016. Patient confirms she received her first chemotherapy yesterday and another today.    Signs/Symptoms  Weight changes, if any: No  Respiratory complaints, if any: denies cough or hemoptysis. Reports shortness of breath with exertion. Reports following lung sx her SOB improved but, within recent weeks has gotten worse.  Pain issues, if any:  intermittent pain on the right side of the chest with radiation to the center. Patient describes "soreness inner right breast since lobectomy."  SAFETY ISSUES:  Prior radiation? no  Pacemaker/ICD? no   Possible current pregnancy?no  Is the patient on methotrexate? no  Current Complaints / other details:  71 year old female. Accompanied by her sister. Lives in [place].

## 2016-10-29 NOTE — Patient Instructions (Signed)
[facility] Discharge Instructions for Patients Receiving Chemotherapy  Today you received the following chemotherapy agents CISPLATIN,ETOPOSIDE  To help prevent nausea and vomiting after your treatment, we encourage you to take your nausea medication : prochlorperazine (COMPAZINE) 10 MG tablet 30 tablet 0 10/21/2016    Sig - Route: Take 1 tablet (10 mg total) by mouth every 6 (six) hours as needed for nausea or vomiting. - Oral   Sent to pharmacy as: prochlorperazine (COMPAZINE) 10 MG tablet      If you develop nausea and vomiting that is not controlled by your nausea medication, call the clinic.   BELOW ARE SYMPTOMS THAT SHOULD BE REPORTED IMMEDIATELY:  *FEVER GREATER THAN 100.5 F  *CHILLS WITH OR WITHOUT FEVER  NAUSEA AND VOMITING THAT IS NOT CONTROLLED WITH YOUR NAUSEA MEDICATION  *UNUSUAL SHORTNESS OF BREATH  *UNUSUAL BRUISING OR BLEEDING  TENDERNESS IN MOUTH AND THROAT WITH OR WITHOUT PRESENCE OF ULCERS  *URINARY PROBLEMS  *BOWEL PROBLEMS  UNUSUAL RASH Items with * indicate a potential emergency and should be followed up as soon as possible.  Feel free to call the clinic should you have any questions or concerns. The clinic phone number is [phone].  Please show the [facility] at check-in to the Emergency Department and triage nurse.

## 2016-10-30 ENCOUNTER — Ambulatory Visit (HOSPITAL_BASED_OUTPATIENT_CLINIC_OR_DEPARTMENT_OTHER): Payer: Medicare Other

## 2016-10-30 ENCOUNTER — Ambulatory Visit
Admission: RE | Admit: 2016-10-30 | Discharge: 2016-10-30 | Disposition: A | Discharge status: Home / Self Care | Payer: Medicare Other | Source: Ambulatory Visit | Attending: Radiation Oncology | Admitting: Radiation Oncology

## 2016-10-30 ENCOUNTER — Encounter: Payer: Self-pay | Admitting: Radiation Oncology

## 2016-10-30 ENCOUNTER — Encounter: Payer: Self-pay | Admitting: Internal Medicine

## 2016-10-30 VITALS — BP 141/61 | HR 77 | Temp 98.4°F | Resp 19

## 2016-10-30 VITALS — BP 156/64 | HR 85 | Resp 18 | Ht 65.0 in | Wt 144.8 lb

## 2016-10-30 DIAGNOSIS — Z510 Encounter for antineoplastic radiation therapy: Secondary | ICD-10-CM | POA: Diagnosis present

## 2016-10-30 DIAGNOSIS — Z87891 Personal history of nicotine dependence: Secondary | ICD-10-CM | POA: Diagnosis not present

## 2016-10-30 DIAGNOSIS — K219 Gastro-esophageal reflux disease without esophagitis: Secondary | ICD-10-CM | POA: Diagnosis not present

## 2016-10-30 DIAGNOSIS — E559 Vitamin D deficiency, unspecified: Secondary | ICD-10-CM | POA: Diagnosis not present

## 2016-10-30 DIAGNOSIS — C3490 Malignant neoplasm of unspecified part of unspecified bronchus or lung: Secondary | ICD-10-CM

## 2016-10-30 DIAGNOSIS — F339 Major depressive disorder, recurrent, unspecified: Secondary | ICD-10-CM | POA: Diagnosis not present

## 2016-10-30 DIAGNOSIS — E785 Hyperlipidemia, unspecified: Secondary | ICD-10-CM | POA: Insufficient documentation

## 2016-10-30 DIAGNOSIS — Z5111 Encounter for antineoplastic chemotherapy: Secondary | ICD-10-CM

## 2016-10-30 DIAGNOSIS — C3411 Malignant neoplasm of upper lobe, right bronchus or lung: Secondary | ICD-10-CM | POA: Insufficient documentation

## 2016-10-30 DIAGNOSIS — J439 Emphysema, unspecified: Secondary | ICD-10-CM | POA: Diagnosis not present

## 2016-10-30 MED ORDER — SODIUM CHLORIDE 0.9 % IV SOLN: 10.0000 mg | Freq: Once | INTRAVENOUS | Status: DC

## 2016-10-30 MED ORDER — SODIUM CHLORIDE 0.9 % IV SOLN
100.0000 mg/m2 | Freq: Once | INTRAVENOUS | Status: AC
  Administered 2016-10-30: 170 mg via INTRAVENOUS
  Filled 2016-10-30: qty 8.5

## 2016-10-30 MED ORDER — SODIUM CHLORIDE 0.9 % IV SOLN
Freq: Once | INTRAVENOUS | Status: AC
  Administered 2016-10-30: 10:00:00 via INTRAVENOUS

## 2016-10-30 MED ORDER — SODIUM CHLORIDE 0.9 % IV SOLN
100.0000 mg/m2 | Freq: Once | INTRAVENOUS | Status: DC
  Filled 2016-10-30: qty 8.5

## 2016-10-30 MED ORDER — DEXAMETHASONE SODIUM PHOSPHATE 10 MG/ML IJ SOLN
INTRAMUSCULAR | Status: AC
  Filled 2016-10-30: qty 1

## 2016-10-30 MED ORDER — DEXAMETHASONE SODIUM PHOSPHATE 10 MG/ML IJ SOLN
10.0000 mg | Freq: Once | INTRAMUSCULAR | Status: AC
  Administered 2016-10-30: 10 mg via INTRAVENOUS

## 2016-10-30 NOTE — Progress Notes (Signed)
[facility]         [phone] ________________________________  Initial Outpatient Consultation  Name: Robyn Barton MRN: [id number]  Date: 10/30/2016  DOB: 12/27/1945  REFERRING PHYSICIAN: [name], [name], MD  DIAGNOSIS: The encounter diagnosis was Small cell lung cancer, right upper lobe (HCC).    ICD-10-CM   1. Small cell lung cancer, right upper lobe (HCC) C34.11     HISTORY OF PRESENT ILLNESS::Robyn Barton is a 71 y.o. female who is being seen today at the request of Dr. [name] for recurrent lung cancer initially diagnosed as limited stage (T1a, N0, M0) small cell lung cancer diagnosed in June 2018. She underwent right upper lobectomy with Dr. Wuddle at [facility] on 06/26/2016.  Final pathology was consistent with small cell lung cancer without evidence of visceral pleural invasion without nodal involvement.  She was not seen by medical oncology at that time. She had an eventful postoperative course requiring readmission for an additional 3 weeks on a ventilator from suspected ARDS at [facility]. She has since recovered fairly well and was seen in follow-up with Dr. Robert Byrum ave [facility] on 08/20/2016. She reports intermittent soreness and numbness in and around her right breast for the past 4 months since surgery. At that time, she was referred for evaluation with Dr. [name].  She met with Dr. [name] on 09/02/2016 recommended proceeding with repeat imaging for disease restaging at that time.  A CT scan of the chest was performed on 09/14/2016 and showed evidence of new subcarinal mediastinal lymphadenopathy as well as a sub-solid, subpleural posterior right upper lobe nodule measuring 1.1 x 0.4 cm.  A PET scan was performed on 09/26/2016 for further evaluation and demonstrated a 2 cm hypermetabolic subcarinal node with SUV max of 4.9. Here was no additional lymphadenopathy or suspicious pulmonary nodules noted.  She underwent  bronchoscopy with EBUS for biopsy of the subcarinal lymph node on 10/15/2016, and the final pathology was consistent with recurrent small cell carcinoma.  She had a follow-up appointment with Dr. [name] on 10/21/2016 and was recommended a course of concurrent chemoradiation. She recently started systemic chemotherapy with cisplatin 60 MG/M2 on day 1 and etoposide 120 MG/M2 on days 1, 2 and 3 every 3 weeks beginning 10/29/2016.  Her second cycle is anticipated to begin on 11/19/2016.  The patient has kindly been referred today for discussion of potential radiation treatment options. She is accompanied by her sister.  PREVIOUS RADIATION THERAPY: No  Past Medical History:  Diagnosis Date  . Allergic rhinitis   . Anxiety   . Chest pain   . Elevated TSH    pt denies  . Emphysema lung (HCC)   . Enlarged lymph nodes    in chest  . GERD (gastroesophageal reflux disease)   . Goals of care, counseling/discussion 10/21/2016  . History of hiatal hernia   . Hyperlipidemia   . Hypoxemia   . IFG (impaired fasting glucose)   . Osteopenia   . Pneumonia    several  times last 2009  . Pulmonary nodule   . Recurrent major depressive disorder (HCC)   . Sleep apnea    mild no cpap needed  . Small cell carcinoma of right lung (HCC) 2018   surgery right middle lobe removed   . Tobacco dependence   . Varicose veins of both lower extremities   . Vitamin D deficiency   :   Past Surgical History:  Procedure Laterality Date  .   ABDOMINAL HYSTERECTOMY     1 ovary left  . ENDOBRONCHIAL ULTRASOUND Bilateral 10/15/2016   Procedure: ENDOBRONCHIAL ULTRASOUND;  Surgeon: Collene Gobble, MD;  Location: [facility];  Service: Cardiopulmonary;  Laterality: Bilateral;  . INCONTINENCE SURGERY    . right middle lobe lung surgery  06/26/2016   [facility]  :   Current Outpatient Prescriptions:  .  albuterol (PROVENTIL HFA;VENTOLIN HFA) 108 (90 Base) MCG/ACT inhaler, Inhale 2 puffs into the lungs every 6 (six)  hours as needed for wheezing or shortness of breath., Disp: , Rfl:  .  albuterol (PROVENTIL) (2.5 MG/3ML) 0.083% nebulizer solution, Inhale 2.5 mg into the lungs every 6 (six) hours as needed for wheezing or shortness of breath. , Disp: , Rfl: 11 .  alendronate (FOSAMAX) 70 MG tablet, Take 70 mg by mouth every Monday. Take with a full glass of water on an empty stomach. , Disp: , Rfl:  .  atorvastatin (LIPITOR) 40 MG tablet, Take 40 mg by mouth at bedtime. , Disp: , Rfl:  .  Cholecalciferol (VITAMIN D) 2000 units tablet, Take 2,000 Units by mouth daily., Disp: , Rfl:  .  loratadine (CLARITIN) 10 MG tablet, Take 10 mg by mouth daily as needed (hay fever)., Disp: , Rfl:  .  omeprazole (PRILOSEC) 40 MG capsule, Take 40 mg by mouth daily. , Disp: , Rfl: 3 .  sertraline (ZOLOFT) 100 MG tablet, Take 100 mg by mouth daily. , Disp: , Rfl:  .  tiotropium (SPIRIVA) 18 MCG inhalation capsule, Place 18 mcg into inhaler and inhale daily., Disp: , Rfl:  .  prochlorperazine (COMPAZINE) 10 MG tablet, Take 1 tablet (10 mg total) by mouth every 6 (six) hours as needed for nausea or vomiting. (Patient not taking: Reported on 10/30/2016), Disp: 30 tablet, Rfl: 0:  No Known Allergies:   Family History  Problem Relation Age of Onset  . Cancer Neg Hx   :  Patient lives in [place], [place]. Social History   Social History  . Marital status: Divorced    Spouse name: N/A  . Number of children: N/A  . Years of education: N/A   Occupational History  . Not on file.   Social History Main Topics  . Smoking status: Former Smoker    Packs/day: 1.50    Years: 50.00    Types: Cigarettes    Quit date: 06/14/2016  . Smokeless tobacco: Never Used  . Alcohol use Yes     Comment: rare  . Drug use: No  . Sexual activity: No   Other Topics Concern  . Not on file   Social History Narrative  . No narrative on file  :  REVIEW OF SYSTEMS:  On review of systems, the patient reports that she is doing well  overall. She denies any chest pain, cough, hemoptysis, fevers, chills, night sweats, or unintended weight changes. She reports shortness of breath with exertion that improved following lobectomy but has worsened in recent weeks. She denies any bowel or bladder disturbances, and denies abdominal pain, nausea or vomiting. She denies any new skin lesions or concerns. She reports intermittent pain on the right side of her chest that radiates to the center of her chest. She describes soreness and numbness in and around her right breast since lobectomy. A complete review of systems is obtained and is otherwise negative.   PHYSICAL EXAM:  Blood pressure (!) 156/64, pulse 85, resp. rate 18, height 5' 5" (1.651 m), weight 144 lb 12.8 oz (65.7  kg), SpO2 99 %. In general this is a well appearing Caucasian female in no acute distress. She is alert and oriented x4 and appropriate throughout the examination. HEENT reveals that the patient is normocephalic, atraumatic. Cardiovascular exam reveals a regular rate and rhythm, no clicks rubs or murmurs are auscultated. Chest is clear to auscultation bilaterally. Abdomen has active bowel sounds in all quadrants and is intact. The abdomen is soft, non tender, non distended. Lower extremities are negative for pretibial pitting edema, deep calf tenderness, cyanosis or clubbing.   KPS = 100  100 - Normal; no complaints; no evidence of disease. 90   - Able to carry on normal activity; minor signs or symptoms of disease. 80   - Normal activity with effort; some signs or symptoms of disease. 70   - Cares for self; unable to carry on normal activity or to do active work. 60   - Requires occasional assistance, but is able to care for most of his personal needs. 50   - Requires considerable assistance and frequent medical care. 40   - Disabled; requires special care and assistance. 30   - Severely disabled; hospital admission is indicated although death not imminent. 20   - Very  sick; hospital admission necessary; active supportive treatment necessary. 10   - Moribund; fatal processes progressing rapidly. 0     - Dead  Karnofsky DA, Abelmann WH, Craver LS and Burchenal JH (1948) The use of the nitrogen mustards in the palliative treatment of carcinoma: with particular reference to bronchogenic carcinoma Cancer 1 634-56  LABORATORY DATA:  Lab Results  Component Value Date   WBC 8.9 10/29/2016   HGB 12.5 10/29/2016   HCT 38.0 10/29/2016   MCV 88.6 10/29/2016   PLT 249 10/29/2016   Lab Results  Component Value Date   NA 141 10/29/2016   K 4.0 10/29/2016   CO2 24 10/29/2016   Lab Results  Component Value Date   ALT 17 10/29/2016   AST 16 10/29/2016   ALKPHOS 64 10/29/2016   BILITOT 0.22 10/29/2016     RADIOGRAPHY: No results found.    IMPRESSION: Robyn Barton is a 71 y.o. woman with recurrent small cell lung cancer initially diagnosed as Limited stage (T1a, N0, M0) small cell lung cancer in June 2018.  PLAN: Today, we talked to the patient and family about the findings and work-up thus far.  We discussed the natural history of recurrent small cell lung cancer and general treatment, highlighting the role of radiotherapy in the management.  We discussed the available radiation techniques, and focused on the details of logistics and delivery.  Dr. Laban Orourke agrees with the recommendation to proceed with concurrent chemoradiotherapy and anticipates a 6 week course of daily radiotherapy. We reviewed the anticipated acute and late sequelae associated with radiation in this setting.  The patient was encouraged to ask questions that we answered to the best of our ability. The patient would like to proceed with concurrent chemoradiation and is scheduled for CT simulation on 11/11/2016 at 10:00 AM in anticipation of beginning treatments on 11/19/2016.   We spent 75 minutes face to face with the patient and more than 50% of that time was spent in counseling and/or  coordination of care.   ------------------------------------------------   Ashlyn W. Bruning, PA-C    Camdon Saetern, MD  [facility] Direct Dial: [phone]  Fax: [phone] [email or web address]  Skype  [email or web address]    Page Me   This document serves   as a record of services personally performed by  , MD and Ashlyn Bruning, PA-C. It was created on their behalf by Haley Woodruff, a trained medical scribe. The creation of this record is based on the scribe's personal observations and the providers' statements to them. This document has been checked and approved by the attending providers.  

## 2016-10-30 NOTE — Progress Notes (Signed)
See progress note under physician encounter. 

## 2016-10-30 NOTE — Patient Instructions (Signed)
Etoposide, VP-16 capsules What is this medicine? ETOPOSIDE, VP-16 (e toe POE side) is a chemotherapy drug. It is used to treat small cell lung cancer and other cancers. This medicine may be used for other purposes; ask your health care provider or pharmacist if you have questions. COMMON BRAND NAME(S): VePesid What should I tell my health care provider before I take this medicine? They need to know if you have any of these conditions: -infection -kidney disease -liver disease -low blood counts, like low white cell, platelet, or red cell counts -an unusual or allergic reaction to etoposide, other medicines, foods, dyes, or preservatives -pregnant or trying to get pregnant -breast-feeding How should I use this medicine? Take this medicine by mouth with a glass of water. Follow the directions on the prescription label. Do not open, crush, or chew the capsules. It is advisable to wear gloves when handling this medicine. Take your medicine at regular intervals. Do not take it more often than directed. Do not stop taking except on your doctor's advice. Talk to your pediatrician regarding the use of this medicine in children. Special care may be needed. Overdosage: If you think you have taken too much of this medicine contact a poison control center or emergency room at once. NOTE: This medicine is only for you. Do not share this medicine with others. What if I miss a dose? If you miss a dose, take it as soon as you can. If it is almost time for your next dose, take only that dose. Do not take double or extra doses. What may interact with this medicine? -aspirin -certain medications for seizures like carbamazepine, phenobarbital, phenytoin, valproic acid -cyclosporine -levamisole -valproic acid -warfarin This list may not describe all possible interactions. Give your health care provider a list of all the medicines, herbs, non-prescription drugs, or dietary supplements you use. Also tell them if  you smoke, drink alcohol, or use illegal drugs. Some items may interact with your medicine. What should I watch for while using this medicine? Visit your doctor for checks on your progress. This drug may make you feel generally unwell. This is not uncommon, as chemotherapy can affect healthy cells as well as cancer cells. Report any side effects. Continue your course of treatment even though you feel ill unless your doctor tells you to stop. In some cases, you may be given additional medicines to help with side effects. Follow all directions for their use. Call your doctor or health care professional for advice if you get a fever, chills or sore throat, or other symptoms of a cold or flu. Do not treat yourself. This drug decreases your body's ability to fight infections. Try to avoid being around people who are sick. This medicine may increase your risk to bruise or bleed. Call your doctor or health care professional if you notice any unusual bleeding. Talk to your doctor about your risk of cancer. You may be more at risk for certain types of cancers if you take this medicine. Do not become pregnant while taking this medicine or for at least 6 months after stopping it. Women should inform their doctor if they wish to become pregnant or think they might be pregnant. Women of child-bearing potential will need to have a negative pregnancy test before starting this medicine. There is a potential for serious side effects to an unborn child. Talk to your health care professional or pharmacist for more information. Do not breast-feed an infant while taking this medicine. Men must use a   latex condom during sexual contact with a woman while taking this medicine and for at least 4 months after stopping it. A latex condom is needed even if you have had a vasectomy. Contact your doctor right away if your partner becomes pregnant. Do not donate sperm while taking this medicine and for 4 months after you stop taking this  medicine. Men should inform their doctors if they wish to father a child. This medicine may lower sperm counts. What side effects may I notice from receiving this medicine? Side effects that you should report to your doctor or health care professional as soon as possible: -allergic reactions like skin rash, itching or hives, swelling of the face, lips, or tongue -low blood counts - this medicine may decrease the number of white blood cells, red blood cells and platelets. You may be at increased risk for infections and bleeding. -signs of infection - fever or chills, cough, sore throat, pain or difficulty passing urine -signs of decreased platelets or bleeding - bruising, pinpoint red spots on the skin, black, tarry stools, blood in the urine -signs of decreased red blood cells - unusually weak or tired, fainting spells, lightheadedness -breathing problems -changes in vision -mouth or throat sores or ulcers -pain, tingling, numbness in the hands or feet -redness, blistering, peeling or loosening of the skin, including inside the mouth -seizures -vomiting Side effects that usually do not require medical attention (report to your doctor or health care professional if they continue or are bothersome): -change in taste -diarrhea -hair loss -nausea -stomach pain This list may not describe all possible side effects. Call your doctor for medical advice about side effects. You may report side effects to FDA at 1-800-FDA-1088. Where should I keep my medicine? Keep out of the reach of children. Store in a refrigerator between 2 and 8 degrees C (36 and 46 degrees F). Do not freeze. Throw away any unused medicine after the expiration date. NOTE: This sheet is a summary. It may not cover all possible information. If you have questions about this medicine, talk to your doctor, pharmacist, or health care provider.  2018 Elsevier/Gold Standard (2014-12-23 11:49:52)  

## 2016-10-30 NOTE — Progress Notes (Signed)
Chemo follow up - pt states she did fine last night after chemo yesterday . She is voiding well and drinking bottled water 4-6 , 12 oz bottles.

## 2016-10-30 NOTE — Progress Notes (Signed)
Met w/ pt to introduce myself as her Arboriculturist.  Unfortunately there aren't any foundations offering copay assistance for her Dx but I informed her of the [employer], went over what it covers and gave her an expense sheet.  She would like to apply so she will provide her proof of income on 10/31/16.  She has my card for any questions or concerns she may have in the future.

## 2016-10-31 ENCOUNTER — Ambulatory Visit (HOSPITAL_BASED_OUTPATIENT_CLINIC_OR_DEPARTMENT_OTHER): Payer: Medicare Other

## 2016-10-31 VITALS — BP 159/87 | HR 73 | Temp 97.9°F | Resp 17

## 2016-10-31 DIAGNOSIS — Z5111 Encounter for antineoplastic chemotherapy: Secondary | ICD-10-CM | POA: Diagnosis not present

## 2016-10-31 DIAGNOSIS — C3490 Malignant neoplasm of unspecified part of unspecified bronchus or lung: Secondary | ICD-10-CM

## 2016-10-31 DIAGNOSIS — C3411 Malignant neoplasm of upper lobe, right bronchus or lung: Secondary | ICD-10-CM | POA: Diagnosis not present

## 2016-10-31 MED ORDER — ETOPOSIDE CHEMO INJECTION 1 GM/50ML
100.0000 mg/m2 | Freq: Once | INTRAVENOUS | Status: AC
  Administered 2016-10-31: 170 mg via INTRAVENOUS
  Filled 2016-10-31: qty 8.5

## 2016-10-31 MED ORDER — SODIUM CHLORIDE 0.9 % IV SOLN
Freq: Once | INTRAVENOUS | Status: AC
  Administered 2016-10-31: 15:00:00 via INTRAVENOUS

## 2016-10-31 MED ORDER — DEXAMETHASONE SODIUM PHOSPHATE 10 MG/ML IJ SOLN
INTRAMUSCULAR | Status: AC
  Filled 2016-10-31: qty 1

## 2016-10-31 MED ORDER — HEPARIN SOD (PORK) LOCK FLUSH 100 UNIT/ML IV SOLN
500.0000 [IU] | Freq: Once | INTRAVENOUS | Status: DC | PRN
  Filled 2016-10-31: qty 5

## 2016-10-31 MED ORDER — DEXAMETHASONE SODIUM PHOSPHATE 10 MG/ML IJ SOLN
10.0000 mg | Freq: Once | INTRAMUSCULAR | Status: AC
  Administered 2016-10-31: 10 mg via INTRAVENOUS

## 2016-10-31 MED ORDER — SODIUM CHLORIDE 0.9% FLUSH
10.0000 mL | INTRAVENOUS | Status: DC | PRN
  Filled 2016-10-31: qty 10

## 2016-10-31 NOTE — Patient Instructions (Signed)
[facility] Discharge Instructions for Patients Receiving Chemotherapy  Today you received the following chemotherapy agents Etoposide.    To help prevent nausea and vomiting after your treatment, we encourage you to take your nausea medication as prescribed.   If you develop nausea and vomiting that is not controlled by your nausea medication, call the clinic.   BELOW ARE SYMPTOMS THAT SHOULD BE REPORTED IMMEDIATELY:  *FEVER GREATER THAN 100.5 F  *CHILLS WITH OR WITHOUT FEVER  NAUSEA AND VOMITING THAT IS NOT CONTROLLED WITH YOUR NAUSEA MEDICATION  *UNUSUAL SHORTNESS OF BREATH  *UNUSUAL BRUISING OR BLEEDING  TENDERNESS IN MOUTH AND THROAT WITH OR WITHOUT PRESENCE OF ULCERS  *URINARY PROBLEMS  *BOWEL PROBLEMS  UNUSUAL RASH Items with * indicate a potential emergency and should be followed up as soon as possible.  Feel free to call the clinic should you have any questions or concerns. The clinic phone number is [phone].  Please show the [facility] [place] at check-in to the Emergency Department and triage nurse.

## 2016-11-02 ENCOUNTER — Ambulatory Visit (HOSPITAL_BASED_OUTPATIENT_CLINIC_OR_DEPARTMENT_OTHER): Payer: Medicare Other

## 2016-11-02 VITALS — BP 154/80 | HR 80 | Temp 98.1°F | Resp 16

## 2016-11-02 DIAGNOSIS — Z5189 Encounter for other specified aftercare: Secondary | ICD-10-CM | POA: Diagnosis not present

## 2016-11-02 DIAGNOSIS — C3411 Malignant neoplasm of upper lobe, right bronchus or lung: Secondary | ICD-10-CM | POA: Diagnosis not present

## 2016-11-02 DIAGNOSIS — C3490 Malignant neoplasm of unspecified part of unspecified bronchus or lung: Secondary | ICD-10-CM

## 2016-11-02 MED ORDER — PEGFILGRASTIM INJECTION 6 MG/0.6ML ~~LOC~~
6.0000 mg | PREFILLED_SYRINGE | Freq: Once | SUBCUTANEOUS | Status: AC
  Administered 2016-11-02: 6 mg via SUBCUTANEOUS

## 2016-11-02 NOTE — Patient Instructions (Signed)
Pegfilgrastim injection What is this medicine? PEGFILGRASTIM (PEG fil gra stim) is a long-acting granulocyte colony-stimulating factor that stimulates the growth of neutrophils, a type of white blood cell important in the body's fight against infection. It is used to reduce the incidence of fever and infection in patients with certain types of cancer who are receiving chemotherapy that affects the bone marrow, and to increase survival after being exposed to high doses of radiation. This medicine may be used for other purposes; ask your health care provider or pharmacist if you have questions. COMMON BRAND NAME(S): Neulasta What should I tell my health care provider before I take this medicine? They need to know if you have any of these conditions: -kidney disease -latex allergy -ongoing radiation therapy -sickle cell disease -skin reactions to acrylic adhesives (On-Body Injector only) -an unusual or allergic reaction to pegfilgrastim, filgrastim, other medicines, foods, dyes, or preservatives -pregnant or trying to get pregnant -breast-feeding How should I use this medicine? This medicine is for injection under the skin. If you get this medicine at home, you will be taught how to prepare and give the pre-filled syringe or how to use the On-body Injector. Refer to the patient Instructions for Use for detailed instructions. Use exactly as directed. Tell your healthcare provider immediately if you suspect that the On-body Injector may not have performed as intended or if you suspect the use of the On-body Injector resulted in a missed or partial dose. It is important that you put your used needles and syringes in a special sharps container. Do not put them in a trash can. If you do not have a sharps container, call your pharmacist or healthcare provider to get one. Talk to your pediatrician regarding the use of this medicine in children. While this drug may be prescribed for selected conditions,  precautions do apply. Overdosage: If you think you have taken too much of this medicine contact a poison control center or emergency room at once. NOTE: This medicine is only for you. Do not share this medicine with others. What if I miss a dose? It is important not to miss your dose. Call your doctor or health care professional if you miss your dose. If you miss a dose due to an On-body Injector failure or leakage, a new dose should be administered as soon as possible using a single prefilled syringe for manual use. What may interact with this medicine? Interactions have not been studied. Give your health care provider a list of all the medicines, herbs, non-prescription drugs, or dietary supplements you use. Also tell them if you smoke, drink alcohol, or use illegal drugs. Some items may interact with your medicine. This list may not describe all possible interactions. Give your health care provider a list of all the medicines, herbs, non-prescription drugs, or dietary supplements you use. Also tell them if you smoke, drink alcohol, or use illegal drugs. Some items may interact with your medicine. What should I watch for while using this medicine? You may need blood work done while you are taking this medicine. If you are going to need a MRI, CT scan, or other procedure, tell your doctor that you are using this medicine (On-Body Injector only). What side effects may I notice from receiving this medicine? Side effects that you should report to your doctor or health care professional as soon as possible: -allergic reactions like skin rash, itching or hives, swelling of the face, lips, or tongue -dizziness -fever -pain, redness, or irritation at site   where injected -pinpoint red spots on the skin -red or dark-brown urine -shortness of breath or breathing problems -stomach or side pain, or pain at the shoulder -swelling -tiredness -trouble passing urine or change in the amount of urine Side  effects that usually do not require medical attention (report to your doctor or health care professional if they continue or are bothersome): -bone pain -muscle pain This list may not describe all possible side effects. Call your doctor for medical advice about side effects. You may report side effects to FDA at 1-800-FDA-1088. Where should I keep my medicine? Keep out of the reach of children. Store pre-filled syringes in a refrigerator between 2 and 8 degrees C (36 and 46 degrees F). Do not freeze. Keep in carton to protect from light. Throw away this medicine if it is left out of the refrigerator for more than 48 hours. Throw away any unused medicine after the expiration date. NOTE: This sheet is a summary. It may not cover all possible information. If you have questions about this medicine, talk to your doctor, pharmacist, or health care provider.  2018 Elsevier/Gold Standard (2015-12-28 12:58:03)  

## 2016-11-05 ENCOUNTER — Ambulatory Visit (HOSPITAL_BASED_OUTPATIENT_CLINIC_OR_DEPARTMENT_OTHER): Payer: Medicare Other | Admitting: Oncology

## 2016-11-05 ENCOUNTER — Encounter: Payer: Self-pay | Admitting: Oncology

## 2016-11-05 ENCOUNTER — Ambulatory Visit (HOSPITAL_BASED_OUTPATIENT_CLINIC_OR_DEPARTMENT_OTHER): Payer: Medicare Other

## 2016-11-05 VITALS — BP 169/70 | HR 94 | Temp 98.1°F | Resp 17 | Ht 65.0 in | Wt 139.5 lb

## 2016-11-05 DIAGNOSIS — C3411 Malignant neoplasm of upper lobe, right bronchus or lung: Secondary | ICD-10-CM | POA: Diagnosis not present

## 2016-11-05 LAB — CBC WITH DIFFERENTIAL/PLATELET
BASO%: 0.3 % (ref 0.0–2.0)
Basophils Absolute: 0 10*3/uL (ref 0.0–0.1)
EOS ABS: 0.1 10*3/uL (ref 0.0–0.5)
EOS%: 2.6 % (ref 0.0–7.0)
HEMATOCRIT: 40.2 % (ref 34.8–46.6)
HGB: 13.3 g/dL (ref 11.6–15.9)
LYMPH#: 1 10*3/uL (ref 0.9–3.3)
LYMPH%: 19.9 % (ref 14.0–49.7)
MCH: 28.7 pg (ref 25.1–34.0)
MCHC: 33.1 g/dL (ref 31.5–36.0)
MCV: 86.6 fL (ref 79.5–101.0)
MONO#: 0.1 10*3/uL (ref 0.1–0.9)
MONO%: 1.4 % (ref 0.0–14.0)
NEUT#: 3.9 10*3/uL (ref 1.5–6.5)
NEUT%: 75.8 % (ref 38.4–76.8)
PLATELETS: 132 10*3/uL — AB (ref 145–400)
RBC: 4.65 10*6/uL (ref 3.70–5.45)
RDW: 14.4 % (ref 11.2–14.5)
WBC: 5.1 10*3/uL (ref 3.9–10.3)

## 2016-11-05 LAB — COMPREHENSIVE METABOLIC PANEL
ALK PHOS: 76 U/L (ref 40–150)
ALT: 18 U/L (ref 0–55)
AST: 14 U/L (ref 5–34)
Albumin: 3.6 g/dL (ref 3.5–5.0)
Anion Gap: 8 mEq/L (ref 3–11)
BILIRUBIN TOTAL: 0.67 mg/dL (ref 0.20–1.20)
BUN: 35.3 mg/dL — AB (ref 7.0–26.0)
CO2: 24 mEq/L (ref 22–29)
CREATININE: 1.1 mg/dL (ref 0.6–1.1)
Calcium: 9.1 mg/dL (ref 8.4–10.4)
Chloride: 102 mEq/L (ref 98–109)
EGFR: 49 mL/min/{1.73_m2} — ABNORMAL LOW (ref >60)
GLUCOSE: 95 mg/dL (ref 70–140)
Potassium: 4.2 mEq/L (ref 3.5–5.1)
SODIUM: 135 meq/L — AB (ref 136–145)
Total Protein: 7.1 g/dL (ref 6.4–8.3)

## 2016-11-05 NOTE — Progress Notes (Signed)
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  SUMMARY OF ONCOLOGIC HISTORY:  No history exists.   PRIOR THERAPY: right upper lobectomy on 06/26/2016 and the final pathology was consistent with small cell lung cancer measuring 1.6 cm in size.  CURRENT THERAPY: systemic chemotherapy with cisplatin 60 MG/M2 on day 1 and etoposide 120 MG/M2 on days 1, 2 and 3 every 3 weeks. Status post 1 cycle.  INTERVAL HISTORY: Robyn Barton 71 y.o. female returns for routine follow-up by herself. The patient is feeling fine today except for fatigue. She did fairly well with her first cycle of chemotherapy. She had a lot of fatigue for about 3-4 days. She had some mild nausea and vomiting on one occasion. She has not had any fevers or chills. Denies chest pain, shortness breath, cough, hemoptysis. Denies nausea, vomiting, diarrhea or constipation today. Appetite is fair, but is picking back up. She has lost some weight. Denies headaches or visual changes. The patient has been evaluated by radiation oncology and is due to begin concurrent radiation in the near future. The patient is upset today because her schedule for radiation oncology has not been posted to my chart. She also expressed some concern over being able to take care of her household and obtain groceries while she is having fatigue related to her chemotherapy. The patient is also asking if she can switch to OnPro instead of a Neulasta injection so that she does not need to travel here on another day for the injection. The patient is here for evaluation and repeat lab work.   Patient Active Problem List   Diagnosis Date Noted  . Goals of care, counseling/discussion 10/21/2016  . Encounter for antineoplastic chemotherapy 10/21/2016  . Mediastinal  lymphadenopathy   . Small cell lung cancer, right upper lobe ([place]) 08/20/2016  . COPD (chronic obstructive pulmonary disease) ([place]) 08/20/2016  . ARDS (adult respiratory distress syndrome) ([place]) 08/20/2016    has No Known Allergies.  MEDICAL HISTORY: Past Medical History:  Diagnosis Date  . Allergic rhinitis   . Anxiety   . Chest pain   . Elevated TSH    pt denies  . Emphysema lung ([place])   . Enlarged lymph nodes    in chest  . GERD (gastroesophageal reflux disease)   . Goals of care, counseling/discussion 10/21/2016  . History of hiatal hernia   . Hyperlipidemia   . Hypoxemia   . IFG (impaired fasting glucose)   . Osteopenia   . Pneumonia    several  times last 2009  . Pulmonary nodule   . Recurrent major depressive disorder ([place])   . Sleep apnea    mild no cpap needed  . Small cell carcinoma of right lung ([place]) 2018   surgery right middle lobe removed   . Tobacco dependence   . Varicose veins of both lower extremities   . Vitamin D deficiency     SURGICAL HISTORY: Past Surgical History:  Procedure Laterality Date  . ABDOMINAL HYSTERECTOMY     1 ovary left  . ENDOBRONCHIAL ULTRASOUND Bilateral 10/15/2016   Procedure: ENDOBRONCHIAL ULTRASOUND;  Surgeon: Collene Gobble, MD;  Location: [facility];  Service: Cardiopulmonary;  Laterality: Bilateral;  . INCONTINENCE SURGERY    . right middle [facility] lung [facility]  [facility]    SOCIAL HISTORY: Social History   Social History  . Marital status: Divorced    Spouse name: N/A  . Number of children: N/A  . Years of education: N/A   Occupational History  . Not on file.   Social History Main Topics  . Smoking status: Former Smoker    Packs/day: 1.50    Years: 50.00    Types: Cigarettes    Quit date: 06/14/2016  . Smokeless tobacco: Never Used  . Alcohol use Yes     Comment: rare  . Drug use: No  . Sexual activity: No   Other Topics Concern  . Not on file   Social History Narrative  . No narrative  on file    FAMILY HISTORY: Family History  Problem Relation Age of Onset  . Cancer Neg Hx     Review of Systems  Constitutional: Positive for appetite change and fatigue. Negative for chills and fever.  HENT:  Negative.   Eyes: Negative.   Respiratory: Negative.   Cardiovascular: Negative.   Gastrointestinal: Negative.   Genitourinary: Negative.    Musculoskeletal: Negative.   Skin: Negative.   Neurological: Negative.   Hematological: Negative.   Psychiatric/Behavioral: Negative.       PHYSICAL EXAMINATION  ECOG PERFORMANCE STATUS: 1 - Symptomatic but completely ambulatory  Vitals:   11/05/16 1005  BP: (!) 169/70  Pulse: 94  Resp: 17  Temp: 98.1 F (36.7 C)  SpO2: 100%    Physical Exam  Constitutional: She is oriented to person, place, and time and well-developed, well-nourished, and in no distress. No distress.  HENT:  Head: Normocephalic and atraumatic.  Mouth/Throat: Oropharynx is clear and moist. No oropharyngeal exudate.  Eyes: Conjunctivae are normal. Right eye exhibits no discharge. Left eye exhibits no discharge. No scleral icterus.  Neck: Normal range of motion. Neck supple.  Cardiovascular: Normal rate, regular rhythm and normal heart sounds.   Pulmonary/Chest: Effort normal and breath sounds normal. No respiratory distress. She has no wheezes. She has no rales.  Abdominal: Soft. Bowel sounds are normal. She exhibits no distension and no mass. There is no tenderness.  Musculoskeletal: Normal range of motion. She exhibits no edema.  Lymphadenopathy:    She has no cervical adenopathy.  Neurological: She is alert and oriented to person, place, and time. She exhibits normal muscle tone. Gait normal. Coordination normal.  Skin: Skin is warm and dry. No rash noted. She is not diaphoretic. No erythema. No pallor.  Psychiatric: Mood, memory, affect and judgment normal.  Vitals reviewed.   LABORATORY DATA:  CBC    Component Value Date/Time   WBC 5.1  11/05/2016 1048   RBC 4.65 11/05/2016 1048   HGB 13.3 11/05/2016 1048   HCT 40.2 11/05/2016 1048   PLT 132 (L) 11/05/2016 1048   MCV 86.6 11/05/2016 1048   MCH 28.7 11/05/2016 1048   MCHC 33.1 11/05/2016 1048   RDW 14.4 11/05/2016 1048   LYMPHSABS 1.0 11/05/2016 1048   MONOABS 0.1 11/05/2016 1048   EOSABS 0.1 11/05/2016 1048   BASOSABS 0.0 11/05/2016 1048    CMP     Component Value Date/Time   NA 135 (L) 11/05/2016 1048   K 4.2 11/05/2016 1048   CO2 24 11/05/2016 1048   GLUCOSE 95 11/05/2016 1048   BUN 35.3 (H) 11/05/2016 1048   CREATININE 1.1 11/05/2016 1048   CALCIUM 9.1 11/05/2016 1048   PROT 7.1 11/05/2016 1048   ALBUMIN 3.6 11/05/2016 1048   AST 14  11/05/2016 1048   ALT 18 11/05/2016 1048   ALKPHOS 76 11/05/2016 1048   BILITOT 0.67 11/05/2016 1048     RADIOGRAPHIC STUDIES:  No results found.  ASSESSMENT and THERAPY PLAN:   Small cell lung cancer, right upper lobe [facility]) This is a very pleasant 71 year old white female with history of limited stage small cell lung cancer status post left upper lobectomy in June 2018. The patient had recent imaging studies including CT scan of the chest as well as a PET scan that showed concerning findings for disease recurrence with hypermetabolic subcarinal lymphadenopathy. She underwent repeat biopsy of the subcarinal lymph node and the final pathology was consistent with recurrent small cell carcinoma.  The patient is now on concurrent chemotherapy and radiation. Her systemic chemotherapy is in be in the form of cisplatin 60 MG/M2 on day 1 and etoposide 120 MG/M2 on days 1, 2 and 3 very 3 weeks.   Labs were reviewed and are stable. We will continue to monitor her labs weekly. A message has been sent scheduling to schedule these appointments. We will plan to see her back in 2 weeks for evaluation prior to cycle 2 of her chemotherapy.  A message was left for radiation oncology to please call the patient with her appointment date and  time for CT simulation.  Referral has been made to social work for them to reach after her to see if there are any additional resources that we can offer her.  I spoken with pharmacy who has adjusted her care plan to change her over from Neulasta to [facility].  She was advised to call immediately if she has any concerning symptoms in the interval. The patient voices understanding of current disease status and treatment options and is in agreement with the current care plan.  All questions were answered. The patient knows to call the clinic with any problems, questions or concerns. We can certainly see the patient much sooner if necessary.   Orders Placed This Encounter  Procedures  . CBC with Differential    Standing Status:   Future    Number of Occurrences:   1    Standing Expiration Date:   11/05/2017  . Comprehensive metabolic panel    Standing Status:   Future    Number of Occurrences:   1    Standing Expiration Date:   11/05/2017  . CBC with Differential    Standing Status:   Standing    Number of Occurrences:   10    Standing Expiration Date:   11/05/2017  . Comprehensive metabolic panel    Standing Status:   Standing    Number of Occurrences:   10    Standing Expiration Date:   11/05/2017  . Magnesium    Standing Status:   Standing    Number of Occurrences:   10    Standing Expiration Date:   11/05/2017  . Ambulatory referral to Social Work    Referral Priority:   Routine    Referral Type:   Consultation    Referral Reason:   Specialty Services Required    Number of Visits Requested:   1    All questions were answered. The patient knows to call the clinic with any problems, questions or concerns. We can certainly see the patient much sooner if necessary.  Mikey Bussing, NP 11/05/2016

## 2016-11-05 NOTE — Assessment & Plan Note (Signed)
This is a very pleasant 71 year old white female with history of limited stage small cell lung cancer status post left upper lobectomy in June 2018. The patient had recent imaging studies including CT scan of the chest as well as a PET scan that showed concerning findings for disease recurrence with hypermetabolic subcarinal lymphadenopathy. She underwent repeat biopsy of the subcarinal lymph node and the final pathology was consistent with recurrent small cell carcinoma.  The patient is now on concurrent chemotherapy and radiation. Her systemic chemotherapy is in be in the form of cisplatin 60 MG/M2 on day 1 and etoposide 120 MG/M2 on days 1, 2 and 3 very 3 weeks.   Labs were reviewed and are stable. We will continue to monitor her labs weekly. A message has been sent scheduling to schedule these appointments. We will plan to see her back in 2 weeks for evaluation prior to cycle 2 of her chemotherapy.  A message was left for radiation oncology to please call the patient with her appointment date and time for CT simulation.  Referral has been made to social work for them to reach after her to see if there are any additional resources that we can offer her.  I spoken with pharmacy who has adjusted her care plan to change her over from Neulasta to [facility].  She was advised to call immediately if she has any concerning symptoms in the interval. The patient voices understanding of current disease status and treatment options and is in agreement with the current care plan.  All questions were answered. The patient knows to call the clinic with any problems, questions or concerns. We can certainly see the patient much sooner if necessary.

## 2016-11-07 ENCOUNTER — Encounter: Payer: Self-pay | Admitting: *Deleted

## 2016-11-07 NOTE — Progress Notes (Signed)
Oncology Nurse Navigator Documentation  Oncology Nurse Navigator Flowsheets 11/07/2016  Navigator Location [facility]  Navigator Encounter Type Other/I followed up on Ms. Headen schedule today. Per Mikey Bussing NP note, she is to see a med onc provider the week of Nov 5th.  I notified scheduling of appt needed.   Treatment Phase Treatment  Barriers/Navigation Needs Coordination of Care  Interventions Coordination of Care  Coordination of Care Other  Acuity Level 1  Time Spent with Patient 30

## 2016-11-08 ENCOUNTER — Telehealth: Payer: Self-pay | Admitting: *Deleted

## 2016-11-08 NOTE — Telephone Encounter (Signed)
"  I'm scheduled for pap smear 11-13-2016 and need a mammogram because I'm overdue.  I am on chemotherapy and not sure if I can have these done.  Call [phone] to let me know what I should I do about having these done."    Routing call information to collaborative nurse and providers for review.  Further patient communication through collaborative nurse.

## 2016-11-09 ENCOUNTER — Emergency Department (HOSPITAL_BASED_OUTPATIENT_CLINIC_OR_DEPARTMENT_OTHER)
Admission: EM | Admit: 2016-11-09 | Discharge: 2016-11-09 | Disposition: A | Discharge status: Home / Self Care | Payer: Medicare Other | Attending: Emergency Medicine | Admitting: Emergency Medicine

## 2016-11-09 ENCOUNTER — Encounter (HOSPITAL_BASED_OUTPATIENT_CLINIC_OR_DEPARTMENT_OTHER): Payer: Self-pay | Admitting: Emergency Medicine

## 2016-11-09 ENCOUNTER — Emergency Department (HOSPITAL_BASED_OUTPATIENT_CLINIC_OR_DEPARTMENT_OTHER): Payer: Medicare Other

## 2016-11-09 DIAGNOSIS — S39012A Strain of muscle, fascia and tendon of lower back, initial encounter: Secondary | ICD-10-CM | POA: Insufficient documentation

## 2016-11-09 DIAGNOSIS — Z79899 Other long term (current) drug therapy: Secondary | ICD-10-CM | POA: Insufficient documentation

## 2016-11-09 DIAGNOSIS — Y999 Unspecified external cause status: Secondary | ICD-10-CM | POA: Diagnosis not present

## 2016-11-09 DIAGNOSIS — N3000 Acute cystitis without hematuria: Secondary | ICD-10-CM | POA: Insufficient documentation

## 2016-11-09 DIAGNOSIS — J449 Chronic obstructive pulmonary disease, unspecified: Secondary | ICD-10-CM | POA: Diagnosis not present

## 2016-11-09 DIAGNOSIS — Y939 Activity, unspecified: Secondary | ICD-10-CM | POA: Insufficient documentation

## 2016-11-09 DIAGNOSIS — X501XXA Overexertion from prolonged static or awkward postures, initial encounter: Secondary | ICD-10-CM | POA: Diagnosis not present

## 2016-11-09 DIAGNOSIS — Z87891 Personal history of nicotine dependence: Secondary | ICD-10-CM | POA: Insufficient documentation

## 2016-11-09 DIAGNOSIS — C3490 Malignant neoplasm of unspecified part of unspecified bronchus or lung: Secondary | ICD-10-CM | POA: Diagnosis not present

## 2016-11-09 DIAGNOSIS — S3992XA Unspecified injury of lower back, initial encounter: Secondary | ICD-10-CM | POA: Diagnosis present

## 2016-11-09 DIAGNOSIS — Y929 Unspecified place or not applicable: Secondary | ICD-10-CM | POA: Diagnosis not present

## 2016-11-09 DIAGNOSIS — T148XXA Other injury of unspecified body region, initial encounter: Secondary | ICD-10-CM

## 2016-11-09 LAB — URINALYSIS, ROUTINE W REFLEX MICROSCOPIC
BILIRUBIN URINE: NEGATIVE
Glucose, UA: NEGATIVE mg/dL
Hgb urine dipstick: NEGATIVE
KETONES UR: NEGATIVE mg/dL
NITRITE: POSITIVE — AB
PH: 6 (ref 5.0–8.0)
Protein, ur: NEGATIVE mg/dL
Specific Gravity, Urine: <1.005 — ABNORMAL LOW (ref 1.005–1.030)

## 2016-11-09 LAB — URINALYSIS, MICROSCOPIC (REFLEX): RBC / HPF: NONE SEEN RBC/hpf (ref 0–5)

## 2016-11-09 MED ORDER — CEPHALEXIN 500 MG PO CAPS: 500.0000 mg | ORAL_CAPSULE | Freq: Four times a day (QID) | ORAL | 0 refills | Status: DC

## 2016-11-09 MED ORDER — CEPHALEXIN 250 MG PO CAPS: 500.0000 mg | ORAL_CAPSULE | Freq: Once | ORAL | Status: DC

## 2016-11-09 MED ORDER — KETOROLAC TROMETHAMINE 30 MG/ML IJ SOLN
30.0000 mg | Freq: Once | INTRAMUSCULAR | Status: AC
  Administered 2016-11-09: 30 mg via INTRAMUSCULAR
  Filled 2016-11-09: qty 1

## 2016-11-09 MED ORDER — HYDROCODONE-ACETAMINOPHEN 5-325 MG PO TABS: 1.0000 | ORAL_TABLET | ORAL | 0 refills | Status: DC | PRN

## 2016-11-09 NOTE — Discharge Instructions (Signed)
Continue to take probiotic pills and yogurt while on Keflex.

## 2016-11-09 NOTE — ED Triage Notes (Signed)
Patient states that she has had lower back pain since last night. Reports that she sat in a chair for a longer than normal time yesterday and the pain started soon after

## 2016-11-09 NOTE — ED Provider Notes (Signed)
[place] [facility] EMERGENCY DEPARTMENT Provider Note   CSN: [id number] Arrival date & time: 11/09/16  8115     History   Chief Complaint Chief Complaint  Patient presents with  . Back Pain    HPI Robyn Barton is a 71 y.o. female.  Pt presents to the ED today with low back pain.  Sx started yesterday after sitting and doing paperwork in 1 position for about 3 hours.  The pt said she took some tylenol and some ibuprofen, but could not find a comfortable position to sleep.  Pt also c/o some pain to both hips.  She denies any radiation.  No dysuria.  No bowel or bladder problems. The pt does also have recurrent small cell lung cancer and is getting chemo.  Last chemo on 10/18.      Past Medical History:  Diagnosis Date  . Allergic rhinitis   . Anxiety   . Chest pain   . Elevated TSH    pt denies  . Emphysema lung ([place])   . Enlarged lymph nodes    in chest  . GERD (gastroesophageal reflux disease)   . Goals of care, counseling/discussion 10/21/2016  . History of hiatal hernia   . Hyperlipidemia   . Hypoxemia   . IFG (impaired fasting glucose)   . Osteopenia   . Pneumonia    several  times last 2009  . Pulmonary nodule   . Recurrent major depressive disorder ([place])   . Sleep apnea    mild no cpap needed  . Small cell carcinoma of right lung ([place]) 2018   surgery right middle lobe removed   . Tobacco dependence   . Varicose veins of both lower extremities   . Vitamin D deficiency     Patient Active Problem List   Diagnosis Date Noted  . Goals of care, counseling/discussion 10/21/2016  . Encounter for antineoplastic chemotherapy 10/21/2016  . Mediastinal lymphadenopathy   . Small cell lung cancer, right upper lobe ([place]) 08/20/2016  . COPD (chronic obstructive pulmonary disease) ([place]) 08/20/2016  . ARDS (adult respiratory distress syndrome) ([place]) 08/20/2016    Past Surgical History:  Procedure Laterality Date  . ABDOMINAL HYSTERECTOMY     1 ovary left  .  ENDOBRONCHIAL ULTRASOUND Bilateral 10/15/2016   Procedure: ENDOBRONCHIAL ULTRASOUND;  Surgeon: Collene Gobble, MD;  Location: [facility];  Service: Cardiopulmonary;  Laterality: Bilateral;  . INCONTINENCE SURGERY    . right middle lobe lung surgery  06/26/2016   [facility]    OB History    No data available       Home Medications    Prior to Admission medications   Medication Sig Start Date End Date Taking? Authorizing Provider  albuterol (PROVENTIL HFA;VENTOLIN HFA) 108 (90 Base) MCG/ACT inhaler Inhale 2 puffs into the lungs every 6 (six) hours as needed for wheezing or shortness of breath.    [provider]  albuterol (PROVENTIL) (2.5 MG/3ML) 0.083% nebulizer solution Inhale 2.5 mg into the lungs every 6 (six) hours as needed for wheezing or shortness of breath.  08/02/16   [provider]  alendronate (FOSAMAX) 70 MG tablet Take 70 mg by mouth every Monday. Take with a full glass of water on an empty stomach.     [provider]  atorvastatin (LIPITOR) 40 MG tablet Take 40 mg by mouth at bedtime.     [provider]  cephALEXin (KEFLEX) 500 MG capsule Take 1 capsule (500 mg total) by mouth 4 (four) times  daily. 11/09/16   Isla Pence, MD  Cholecalciferol (VITAMIN D) 2000 units tablet Take 2,000 Units by mouth daily.    [provider]  HYDROcodone-acetaminophen (NORCO/VICODIN) 5-325 MG tablet Take 1 tablet by mouth every 4 (four) hours as needed. 11/09/16   Isla Pence, MD  loratadine (CLARITIN) 10 MG tablet Take 10 mg by mouth daily as needed (hay fever).    [provider]  omeprazole (PRILOSEC) 40 MG capsule Take 40 mg by mouth daily.  08/01/16   [provider]  prochlorperazine (COMPAZINE) 10 MG tablet Take 1 tablet (10 mg total) by mouth every 6 (six) hours as needed for nausea or vomiting. 10/21/16   [name], MD  sertraline (ZOLOFT) 100 MG tablet Take 100 mg by mouth daily.     [provider]    tiotropium (SPIRIVA) 18 MCG inhalation capsule Place 18 mcg into inhaler and inhale daily.    [provider]    Family History Family History  Problem Relation Age of Onset  . Cancer Neg Hx     Social History Social History  Substance Use Topics  . Smoking status: Former Smoker    Packs/day: 1.50    Years: 50.00    Types: Cigarettes    Quit date: 06/14/2016  . Smokeless tobacco: Never Used  . Alcohol use Yes     Comment: rare     Allergies   Patient has no known allergies.   Review of Systems Review of Systems  Musculoskeletal: Positive for back pain.       Bilateral hip pain  All other systems reviewed and are negative.    Physical Exam Updated Vital Signs BP (!) 150/64 (BP Location: Left Arm)   Pulse 84   Temp 97.6 F (36.4 C) (Oral)   Resp 16   Ht 5\' 5"  (1.651 m)   Wt 63 kg (139 lb)   SpO2 100%   BMI 23.13 kg/m   Physical Exam  Constitutional: She is oriented to person, place, and time. She appears well-developed and well-nourished.  HENT:  Head: Normocephalic and atraumatic.  Right Ear: External ear normal.  Left Ear: External ear normal.  Nose: Nose normal.  Mouth/Throat: Oropharynx is clear and moist.  Eyes: Pupils are equal, round, and reactive to light. Conjunctivae and EOM are normal.  Neck: Normal range of motion. Neck supple.  Cardiovascular: Normal rate, regular rhythm, normal heart sounds and intact distal pulses.   Pulmonary/Chest: Effort normal and breath sounds normal.  Abdominal: Soft. Bowel sounds are normal.  Musculoskeletal: Normal range of motion.       Right hip: She exhibits tenderness.       Left hip: She exhibits tenderness.       Lumbar back: She exhibits tenderness.  Neurological: She is alert and oriented to person, place, and time.  Skin: Skin is warm. Capillary refill takes less than 2 seconds.  Psychiatric: She has a normal mood and affect. Her behavior is normal. Judgment and thought content normal.  Nursing  note and vitals reviewed.    ED Treatments / Results  Labs (all labs ordered are listed, but only abnormal results are displayed) Labs Reviewed  URINALYSIS, ROUTINE W REFLEX MICROSCOPIC - Abnormal; Notable for the following:       Result Value   APPearance CLOUDY (*)    Specific Gravity, Urine <1.005 (*)    Nitrite POSITIVE (*)    Leukocytes, UA TRACE (*)    All other components within normal limits  URINALYSIS, MICROSCOPIC (REFLEX) - Abnormal; Notable for the following:    Bacteria, UA MANY (*)    Squamous Epithelial / LPF 6-30 (*)    All other components within normal limits  URINE CULTURE    EKG  EKG Interpretation None       Radiology Dg Lumbar Spine Complete  Result Date: 11/09/2016 CLINICAL DATA:  71 year-old female c/o lower back pain since last night. Reports that she sat in a chair for a longer than normal time yesterday and the pain started soon after EXAM: [place] 4+ VIEW COMPARISON:  PET-CT 09/26/2016 FINDINGS: There are degenerative changes in the lumbar spine, most notably at L1-2 and L2-3 where there is disc height loss and facet hypertrophy. Facet hypertrophy also noted at L5-S1. No suspicious lytic or blastic lesions are identified. No acute fracture or traumatic subluxation. IMPRESSION: 1. Moderate degenerative changes. 2.  No evidence for acute  abnormality. 3. No evidence for metastatic disease. Electronically Signed   By: Nolon Nations M.D.   On: 11/09/2016 09:09   Dg Pelvis 1-2 Views  Result Date: 11/09/2016 CLINICAL DATA:  71 year-old female c/o lower back pain since last night. Reports that she sat in a chair for a longer than normal time yesterday and the pain started soon after the patient is undergoing chemotherapy for lung cancer. EXAM: PELVIS - 1-2 VIEW COMPARISON:  PET-CT on 05/14/2016 FINDINGS: There are degenerative changes in the lower lumbar spine. No acute fracture or subluxation. Bowel gas pattern nonobstructed. IMPRESSION:  No evidence for acute  abnormality. Electronically Signed   By: Nolon Nations M.D.   On: 11/09/2016 09:04    Procedures Procedures (including critical care time)  Medications Ordered in ED Medications  cephALEXin (KEFLEX) capsule 500 mg (not administered)  ketorolac (TORADOL) 30 MG/ML injection 30 mg (30 mg Intramuscular Given 11/09/16 0856)     Initial Impression / Assessment and Plan / ED Course  I have reviewed the triage vital signs and the nursing notes.  Pertinent labs & imaging results that were available during my care of the patient were reviewed by me and considered in my medical decision making (see chart for details).    Pt's sx likely muscular.  Xrays show no evidence of mets.  The UA did show +nitrite and many bacteria, so I will treat with keflex and send ua for culture.  Pt to return if worse and f/u with pcp.  Final Clinical Impressions(s) / ED Diagnoses   Final diagnoses:  Muscle strain  Acute cystitis without hematuria    New Prescriptions New Prescriptions   CEPHALEXIN (KEFLEX) 500 MG CAPSULE    Take 1 capsule (500 mg total) by mouth 4 (four) times daily.   HYDROCODONE-ACETAMINOPHEN (NORCO/VICODIN) 5-325 MG TABLET    Take 1 tablet by mouth every 4 (four) hours as needed.     Isla Pence, MD 11/09/16 1023

## 2016-11-10 NOTE — Telephone Encounter (Signed)
She may need to delay these tests until after completion of chemotherapy.

## 2016-11-11 ENCOUNTER — Other Ambulatory Visit (HOSPITAL_BASED_OUTPATIENT_CLINIC_OR_DEPARTMENT_OTHER): Payer: Medicare Other

## 2016-11-11 ENCOUNTER — Ambulatory Visit
Admission: RE | Admit: 2016-11-11 | Discharge: 2016-11-11 | Disposition: A | Discharge status: Home / Self Care | Payer: Medicare Other | Source: Ambulatory Visit | Attending: Radiation Oncology | Admitting: Radiation Oncology

## 2016-11-11 ENCOUNTER — Encounter: Payer: Self-pay | Admitting: Internal Medicine

## 2016-11-11 DIAGNOSIS — C3411 Malignant neoplasm of upper lobe, right bronchus or lung: Secondary | ICD-10-CM

## 2016-11-11 DIAGNOSIS — Z510 Encounter for antineoplastic radiation therapy: Secondary | ICD-10-CM | POA: Diagnosis not present

## 2016-11-11 LAB — COMPREHENSIVE METABOLIC PANEL
ALT: 15 U/L (ref 0–55)
ANION GAP: 8 meq/L (ref 3–11)
AST: 34 U/L (ref 5–34)
Albumin: 3.2 g/dL — ABNORMAL LOW (ref 3.5–5.0)
Alkaline Phosphatase: 110 U/L (ref 40–150)
BUN: 12.5 mg/dL (ref 7.0–26.0)
CHLORIDE: 105 meq/L (ref 98–109)
CO2: 24 meq/L (ref 22–29)
CREATININE: 1 mg/dL (ref 0.6–1.1)
Calcium: 8.7 mg/dL (ref 8.4–10.4)
EGFR: 54 mL/min/{1.73_m2} — ABNORMAL LOW (ref >60)
Glucose: 124 mg/dl (ref 70–140)
POTASSIUM: 3.8 meq/L (ref 3.5–5.1)
Sodium: 137 mEq/L (ref 136–145)
Total Bilirubin: <0.22 mg/dL (ref 0.20–1.20)
Total Protein: 6.4 g/dL (ref 6.4–8.3)

## 2016-11-11 LAB — CBC WITH DIFFERENTIAL/PLATELET
BASO%: 0.2 % (ref 0.0–2.0)
Basophils Absolute: 0.1 10*3/uL (ref 0.0–0.1)
EOS%: 0.9 % (ref 0.0–7.0)
Eosinophils Absolute: 0.4 10*3/uL (ref 0.0–0.5)
HCT: 33 % — ABNORMAL LOW (ref 34.8–46.6)
HGB: 10.8 g/dL — ABNORMAL LOW (ref 11.6–15.9)
LYMPH%: 6.5 % — AB (ref 14.0–49.7)
MCH: 28.2 pg (ref 25.1–34.0)
MCHC: 32.7 g/dL (ref 31.5–36.0)
MCV: 86.3 fL (ref 79.5–101.0)
MONO#: 0.5 10*3/uL (ref 0.1–0.9)
MONO%: 1.1 % (ref 0.0–14.0)
NEUT#: 40.9 10*3/uL — ABNORMAL HIGH (ref 1.5–6.5)
NEUT%: 91.3 % — AB (ref 38.4–76.8)
PLATELETS: 107 10*3/uL — AB (ref 145–400)
RBC: 3.82 10*6/uL (ref 3.70–5.45)
RDW: 14.4 % (ref 11.2–14.5)
WBC: 44.8 10*3/uL — ABNORMAL HIGH (ref 3.9–10.3)
lymph#: 2.9 10*3/uL (ref 0.9–3.3)

## 2016-11-11 LAB — URINE CULTURE

## 2016-11-11 LAB — MAGNESIUM: Magnesium: 1.2 mg/dl — CL (ref 1.5–2.5)

## 2016-11-11 NOTE — Progress Notes (Signed)
Patient came in to bring proof of income for [place].  Patient approved for one-time $400 grant. Patient has a copy of the approval letter as well as the expense sheet along with our Outpatient pharmacy information. Explained to patient how grant works and what is covered. She verbalized understanding. Patient received a gas card today from her grant.

## 2016-11-11 NOTE — Progress Notes (Signed)
  Radiation Oncology         [phone]) [phone] ________________________________  Name: Robyn Barton MRN: [id number]  Date: 11/11/2016  DOB: 10-10-1945  SIMULATION AND TREATMENT PLANNING NOTE    ICD-10-CM   1. 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  Identity was confirmed.  All relevant records and images related to the planned course of therapy were reviewed.  The patient freely provided informed written consent to proceed with treatment after reviewing the details related to the planned course of therapy. The consent form was witnessed and verified by the simulation staff.  Then, the patient was set-up in a stable reproducible  supine position for radiation therapy.  CT images were obtained.  Surface markings were placed.  The CT images were loaded into the planning software.  Then the target and avoidance structures were contoured.  Treatment planning then occurred.  The radiation prescription was entered and confirmed.  Then, I designed and supervised the construction of a total of 6 medically necessary complex treatment devices, including a BodyFix immobilization mold custom fitted to the patient along with 5 multileaf collimators conformally shaped radiation around the treatment target while shielding critical structures such as the heart and spinal cord maximally.  I have requested : 3D Simulation  I have requested a DVH of the following structures: Left lung, right lung, spinal cord, heart, esophagus, and target.  I have ordered:Nutrition Consult  SPECIAL TREATMENT PROCEDURE:  The planned course of therapy using radiation constitutes a special treatment procedure. Special care is required in the management of this patient for the following reasons.  The patient will be receiving concurrent chemotherapy  requiring careful monitoring for increased toxicities of treatment including periodic laboratory values.  The special nature of the planned course of radiotherapy will require increased physician supervision and oversight to ensure patient's safety with optimal treatment outcomes.  PLAN:  The patient will receive 59.4 Gy in 33 fractions.  ________________________________  Sheral Apley Tammi Klippel, M.D.

## 2016-11-11 NOTE — Telephone Encounter (Signed)
Pt notified.  She is on Keflex for UTI. This am she is nauseated and has diarrhea. She thinks it is from antibiotic . She is on her way in for xrt.

## 2016-11-12 ENCOUNTER — Telehealth: Payer: Self-pay | Admitting: Oncology

## 2016-11-12 ENCOUNTER — Telehealth: Payer: Self-pay

## 2016-11-12 ENCOUNTER — Telehealth: Payer: Self-pay | Admitting: Emergency Medicine

## 2016-11-12 ENCOUNTER — Other Ambulatory Visit: Payer: Self-pay | Admitting: Medical Oncology

## 2016-11-12 MED ORDER — MAGNESIUM OXIDE 400 (241.3 MG) MG PO TABS: 400.0000 mg | ORAL_TABLET | Freq: Three times a day (TID) | ORAL | 1 refills | Status: DC

## 2016-11-12 NOTE — Plan of Care (Signed)
Pt notified that mag oxide called in . She called Dr Lamonte Sakai today due to increased sob, wheezing.Temp 99.1 to 99.44f. Has an appt with [facility] on Friday . She is on antibiotic for UTI from [place].

## 2016-11-12 NOTE — Telephone Encounter (Signed)
Pt called to let MD know that she got information on her urine culture and they are changing her antibiotic. She wanted MD to know b/c this may explain why she is still having fevers.

## 2016-11-12 NOTE — Telephone Encounter (Signed)
Spoke to patient regarding upcoming appointment updates. Patient scheduled per 10/25 sch message.

## 2016-11-12 NOTE — Telephone Encounter (Signed)
Spoke with pt, she hasn't had any trouble with her breathing and her sats run 91-92 and it is usually 97%. She states she can feel the difference. She is coughing but it is a dry cough. She stated she wanted to see Dr. Lamonte Sakai so he can evaluate this. I mad an appt for 11/15/2016 at 9:30. Nothing further is needed.

## 2016-11-12 NOTE — Telephone Encounter (Signed)
Post ED Visit - Positive Culture Follow-up: Successful Patient Follow-Up  Culture assessed and recommendations reviewed by: []  Elenor Quinones, Pharm.D. []  Heide Guile, Pharm.D., BCPS AQ-ID []  Parks Neptune, Pharm.D., BCPS []  Alycia Rossetti, Pharm.D., BCPS []  [place], Pharm.D., BCPS, AAHIVP []  Legrand Como, Pharm.D., BCPS, AAHIVP []  Salome Arnt, PharmD, BCPS []  Dimitri Ped, PharmD, BCPS []  Vincenza Hews, PharmD, BCPS Pearson Grippe PharmD  Positive urine culture  []  Patient discharged without antimicrobial prescription and treatment is now indicated [x]  Organism is resistant to prescribed ED discharge antimicrobial []  Patient with positive blood cultures  Changes discussed with ED provider: Benedetto Goad PA New antibiotic prescription stop Cephalexin, start Macrobid 100mg  po bid x 5 days Called to [name] [phone]  Contacted patient, 11/12/2016 1115   Robyn Barton 11/12/2016, 11:16 AM

## 2016-11-14 DIAGNOSIS — Z510 Encounter for antineoplastic radiation therapy: Secondary | ICD-10-CM | POA: Diagnosis not present

## 2016-11-15 ENCOUNTER — Encounter: Payer: Self-pay | Admitting: *Deleted

## 2016-11-15 ENCOUNTER — Other Ambulatory Visit (INDEPENDENT_AMBULATORY_CARE_PROVIDER_SITE_OTHER): Payer: Medicare Other

## 2016-11-15 ENCOUNTER — Telehealth: Payer: Self-pay | Admitting: Emergency Medicine

## 2016-11-15 ENCOUNTER — Encounter: Payer: Self-pay | Admitting: Emergency Medicine

## 2016-11-15 ENCOUNTER — Ambulatory Visit (INDEPENDENT_AMBULATORY_CARE_PROVIDER_SITE_OTHER): Payer: Medicare Other | Admitting: Emergency Medicine

## 2016-11-15 ENCOUNTER — Ambulatory Visit (HOSPITAL_BASED_OUTPATIENT_CLINIC_OR_DEPARTMENT_OTHER)
Admission: RE | Admit: 2016-11-15 | Discharge: 2016-11-15 | Disposition: A | Discharge status: Home / Self Care | Payer: Medicare Other | Source: Ambulatory Visit | Attending: Emergency Medicine | Admitting: Emergency Medicine

## 2016-11-15 ENCOUNTER — Encounter (HOSPITAL_BASED_OUTPATIENT_CLINIC_OR_DEPARTMENT_OTHER): Payer: Self-pay

## 2016-11-15 VITALS — BP 144/82 | HR 87 | Ht 65.0 in | Wt 142.0 lb

## 2016-11-15 DIAGNOSIS — R0989 Other specified symptoms and signs involving the circulatory and respiratory systems: Secondary | ICD-10-CM | POA: Diagnosis not present

## 2016-11-15 DIAGNOSIS — R600 Localized edema: Secondary | ICD-10-CM | POA: Diagnosis not present

## 2016-11-15 DIAGNOSIS — R0600 Dyspnea, unspecified: Secondary | ICD-10-CM | POA: Insufficient documentation

## 2016-11-15 DIAGNOSIS — C3411 Malignant neoplasm of upper lobe, right bronchus or lung: Secondary | ICD-10-CM

## 2016-11-15 DIAGNOSIS — J449 Chronic obstructive pulmonary disease, unspecified: Secondary | ICD-10-CM | POA: Diagnosis not present

## 2016-11-15 DIAGNOSIS — Z902 Acquired absence of lung [part of]: Secondary | ICD-10-CM | POA: Insufficient documentation

## 2016-11-15 DIAGNOSIS — D729 Disorder of white blood cells, unspecified: Secondary | ICD-10-CM | POA: Diagnosis not present

## 2016-11-15 LAB — CBC WITH DIFFERENTIAL/PLATELET
BASOS PCT: 0.4 % (ref 0.0–3.0)
Basophils Absolute: 0.1 10*3/uL (ref 0.0–0.1)
EOS PCT: 1.1 % (ref 0.0–5.0)
Eosinophils Absolute: 0.3 10*3/uL (ref 0.0–0.7)
HCT: 32.9 % — ABNORMAL LOW (ref 36.0–46.0)
HEMOGLOBIN: 10.9 g/dL — AB (ref 12.0–15.0)
LYMPHS ABS: 2.2 10*3/uL (ref 0.7–4.0)
Lymphocytes Relative: 7.7 % — ABNORMAL LOW (ref 12.0–46.0)
MCHC: 33.3 g/dL (ref 30.0–36.0)
MCV: 87.7 fl (ref 78.0–100.0)
MONO ABS: 3.4 10*3/uL — AB (ref 0.1–1.0)
Monocytes Relative: 11.9 % (ref 3.0–12.0)
Neutro Abs: 22.3 10*3/uL — ABNORMAL HIGH (ref 1.4–7.7)
Neutrophils Relative %: 78.9 % — ABNORMAL HIGH (ref 43.0–77.0)
Platelets: 246 10*3/uL (ref 150.0–400.0)
RBC: 3.75 Mil/uL — ABNORMAL LOW (ref 3.87–5.11)
RDW: 14.1 % (ref 11.5–15.5)

## 2016-11-15 MED ORDER — IOPAMIDOL (ISOVUE-370) INJECTION 76%
100.0000 mL | Freq: Once | INTRAVENOUS | Status: AC | PRN
  Administered 2016-11-15: 100 mL via INTRAVENOUS

## 2016-11-15 NOTE — Assessment & Plan Note (Signed)
undergoing chemotherapy and preparing for radiation therapy

## 2016-11-15 NOTE — Telephone Encounter (Signed)
Called and spoke with pt and she wanted to see if RB would be able to call her today with the results of her CT scan that was done this am.  RB please advise. Thanks

## 2016-11-15 NOTE — Assessment & Plan Note (Signed)
Spiriva. Albuterol as needed No evidence of current exacerbation

## 2016-11-15 NOTE — Telephone Encounter (Signed)
Please let her know that there is no evidence of blood clots.  Her lymph nodes in the middle of the chest are slightly smaller.  There are no new nodules or lung changes concerning for progression of her cancer.  This is all good news.

## 2016-11-15 NOTE — Assessment & Plan Note (Signed)
Acute dyspnea, etiology unclear.  She has describes some wheeze but not wheezing currently.  Also she notes that she has urinary tract infection.  I suppose that it is possible that her COPD may be more bothersome in the setting of acute infection, fatigue.  She has some bilateral lower extremity edema although none on exam today.  Need to evaluate for possible PE, evaluate for progression of her malignancy.  She has a CT scan chest arranged for next month but I will perform early as a PE study.   We will perform your CT scan of the chest early to evaluate your cancer and also to check for blood clots.  We will perform lab work today Please continue your Spiriva once a day Use albuterol 2 puffs as needed for shortness of breath or wheezing Follow with Dr Lamonte Sakai or APP in 1 week to review your testing

## 2016-11-15 NOTE — Progress Notes (Signed)
Subjective:    Patient ID: Robyn Barton, female    DOB: 04/19/1945, 71 y.o.   MRN: [id number]  HPI 71 yo former smoker (3 pk-yrs), hx Of COPD, recently diagnosed small cell lung cancer in the right middle lobe found by lung cancer screening CT scan. She underwent a right middle lobectomy on 06/26/16 by Dr. Lianne Moris at [facility] Negative station 7 and 11 R nodes, negative surgical margin. Also with a history of mild obstructive sleep apnea not on CPAP. Post-op she had to be admitted to [facility] for progressive dyspnea, treated for possible HCAP vs ARDS, mechanically ventilated, admitted for several weeks.   She is currently managed on Spiriva. Uses albuterol prn >> about 2x a day. She has had more dyspnea since the surgery and the rehospitailzation.  She was d/c on O2 1L/min, has not been using it.   FEV1 from a review of her notes for/20/18 FVC 2.35 L (80% predicted) FEV1 1.77 L (80% predicted), FEV1 to FVC ratio 76%  She is transferring her care to [facility].   Acute OV 11/15/16 --this is an acute visit for patient with a history of COPD, small cell lung cancer, limited stage status post resection.  I saw her for mediastinal nodal biopsies after she had increasing lymphadenopathy 10/2016.  This was consistent with small cell lung cancer. She is preparing for XRT to the mediastinal lesions. She is s/p first round chemo w Dr [name]. She has noticed in increase in dyspnea on exertion beginning 4-5 days ago. She is currently being treated for UTI. Minimal cough, non-productive. No sick contacts. Low grade fever. She had to use her albuterol a few times over the last few days as well, hearing some wheeze. She notes a mold exposure at home, just detected yesterday. Denies any CP. She has noticed some L > R LE edema about 3-4 days ago.    Review of Systems  Constitutional: Negative for fever and unexpected weight change.  HENT: Negative for congestion, dental problem, ear pain, nosebleeds,  postnasal drip, rhinorrhea, sinus pressure, sneezing, sore throat and trouble swallowing.   Eyes: Negative for redness and itching.  Respiratory: Positive for shortness of breath. Negative for cough, chest tightness and wheezing.   Cardiovascular: Negative for palpitations and leg swelling.  Gastrointestinal: Negative for nausea and vomiting.  Genitourinary: Negative for dysuria.  Musculoskeletal: Negative for joint swelling.  Skin: Negative for rash.  Neurological: Negative for headaches.  Hematological: Does not bruise/bleed easily.  Psychiatric/Behavioral: Negative for dysphoric mood. The patient is not nervous/anxious.     Past Medical History:  Diagnosis Date  . Allergic rhinitis   . Anxiety   . Chest pain   . Elevated TSH    pt denies  . Emphysema lung ([place])   . Enlarged lymph nodes    in chest  . GERD (gastroesophageal reflux disease)   . Goals of care, counseling/discussion 10/21/2016  . History of hiatal hernia   . Hyperlipidemia   . Hypoxemia   . IFG (impaired fasting glucose)   . Osteopenia   . Pneumonia    several  times last 2009  . Pulmonary nodule   . Recurrent major depressive disorder ([place])   . Sleep apnea    mild no cpap needed  . Small cell carcinoma of right lung ([place]) 2018   surgery right middle lobe removed   . Tobacco dependence   . Varicose veins of both lower extremities   . Vitamin D deficiency  Family History  Problem Relation Age of Onset  . Cancer Neg Hx      Social History   Social History  . Marital status: Divorced    Spouse name: N/A  . Number of children: N/A  . Years of education: N/A   Occupational History  . Not on file.   Social History Main Topics  . Smoking status: Former Smoker    Packs/day: 1.50    Years: 50.00    Types: Cigarettes    Quit date: 06/14/2016  . Smokeless tobacco: Never Used  . Alcohol use Yes     Comment: rare  . Drug use: No  . Sexual activity: No   Other Topics Concern  . Not on file    Social History Narrative  . No narrative on file     No Known Allergies   Outpatient Medications Prior to Visit  Medication Sig Dispense Refill  . albuterol (PROVENTIL HFA;VENTOLIN HFA) 108 (90 Base) MCG/ACT inhaler Inhale 2 puffs into the lungs every 6 (six) hours as needed for wheezing or shortness of breath.    Marland Kitchen albuterol (PROVENTIL) (2.5 MG/3ML) 0.083% nebulizer solution Inhale 2.5 mg into the lungs every 6 (six) hours as needed for wheezing or shortness of breath.   11  . alendronate (FOSAMAX) 70 MG tablet Take 70 mg by mouth every Monday. Take with a full glass of water on an empty stomach.     Marland Kitchen atorvastatin (LIPITOR) 40 MG tablet Take 40 mg by mouth at bedtime.     . cephALEXin (KEFLEX) 500 MG capsule Take 1 capsule (500 mg total) by mouth 4 (four) times daily. 28 capsule 0  . Cholecalciferol (VITAMIN D) 2000 units tablet Take 2,000 Units by mouth daily.    Marland Kitchen HYDROcodone-acetaminophen (NORCO/VICODIN) 5-325 MG tablet Take 1 tablet by mouth every 4 (four) hours as needed. 10 tablet 0  . loratadine (CLARITIN) 10 MG tablet Take 10 mg by mouth daily as needed (hay fever).    . magnesium oxide (MAG-OX) 400 (241.3 Mg) MG tablet Take 1 tablet (400 mg total) by mouth 3 (three) times daily. 90 tablet 1  . omeprazole (PRILOSEC) 40 MG capsule Take 40 mg by mouth daily.   3  . prochlorperazine (COMPAZINE) 10 MG tablet Take 1 tablet (10 mg total) by mouth every 6 (six) hours as needed for nausea or vomiting. 30 tablet 0  . sertraline (ZOLOFT) 100 MG tablet Take 100 mg by mouth daily.     Marland Kitchen tiotropium (SPIRIVA) 18 MCG inhalation capsule Place 18 mcg into inhaler and inhale daily.     No facility-administered medications prior to visit.         Objective:   Physical Exam Vitals:   11/15/16 0929  BP: (!) 144/82  Pulse: 87  SpO2: 97%  Weight: 142 lb (64.4 kg)  Height: 5\' 5"  (1.651 m)   Gen: Pleasant, well-nourished, in no distress,  normal affect  ENT: No lesions,  mouth clear,   oropharynx clear, no postnasal drip  Neck: No JVD, no stridor  Lungs: No use of accessory muscles, clear without rales or rhonchi  Cardiovascular: RRR, heart sounds normal, no murmur or gallops, no peripheral edema  Musculoskeletal: No deformities, no cyanosis or clubbing  Neuro: alert, non focal  Skin: Warm, no lesions or rashes       Assessment & Plan:  Dyspnea Acute dyspnea, etiology unclear.  She has describes some wheeze but not wheezing currently.  Also she notes that she has  urinary tract infection.  I suppose that it is possible that her COPD may be more bothersome in the setting of acute infection, fatigue.  She has some bilateral lower extremity edema although none on exam today.  Need to evaluate for possible PE, evaluate for progression of her malignancy.  She has a CT scan chest arranged for next month but I will perform early as a PE study.   We will perform your CT scan of the chest early to evaluate your cancer and also to check for blood clots.  We will perform lab work today Please continue your Spiriva once a day Use albuterol 2 puffs as needed for shortness of breath or wheezing Follow with Dr Lamonte Sakai or APP in 1 week to review your testing  COPD (chronic obstructive pulmonary disease) ([place]) Spiriva. Albuterol as needed No evidence of current exacerbation  Small cell lung cancer, right upper lobe ([place]) undergoing chemotherapy and preparing for radiation therapy  Baltazar Apo, MD, PhD 11/15/2016, 10:03 AM [facility] [phone] or if no answer [phone]

## 2016-11-15 NOTE — Patient Instructions (Addendum)
We will perform your CT scan of the chest early to evaluate your cancer and also to check for blood clots.  We will perform lab work today Please continue your Spiriva once a day Use albuterol 2 puffs as needed for shortness of breath or wheezing Follow with Dr Lamonte Sakai or APP in 1 week to review your testing

## 2016-11-15 NOTE — Telephone Encounter (Signed)
Called spoke with patient, advised of CT results as stated by RB below Pt voiced her understanding and denied any questions/concerns E-mail sent to patient as well with RB's results  Nothing further needed at this time; will sign off

## 2016-11-16 LAB — D-DIMER, QUANTITATIVE (NOT AT ARMC): D DIMER QUANT: 1.1 ug{FEU}/mL — AB (ref <0.50)

## 2016-11-18 ENCOUNTER — Ambulatory Visit (HOSPITAL_BASED_OUTPATIENT_CLINIC_OR_DEPARTMENT_OTHER): Payer: Medicare Other | Admitting: Nurse Practitioner

## 2016-11-18 ENCOUNTER — Encounter: Payer: Self-pay | Admitting: Nurse Practitioner

## 2016-11-18 ENCOUNTER — Other Ambulatory Visit (HOSPITAL_BASED_OUTPATIENT_CLINIC_OR_DEPARTMENT_OTHER): Payer: Medicare Other

## 2016-11-18 ENCOUNTER — Ambulatory Visit
Admission: RE | Admit: 2016-11-18 | Discharge: 2016-11-18 | Disposition: A | Discharge status: Home / Self Care | Payer: Medicare Other | Source: Ambulatory Visit | Attending: Radiation Oncology | Admitting: Radiation Oncology

## 2016-11-18 VITALS — BP 153/61 | HR 83 | Temp 98.5°F | Resp 17 | Ht 65.0 in | Wt 143.8 lb

## 2016-11-18 DIAGNOSIS — Z5111 Encounter for antineoplastic chemotherapy: Secondary | ICD-10-CM

## 2016-11-18 DIAGNOSIS — R197 Diarrhea, unspecified: Secondary | ICD-10-CM

## 2016-11-18 DIAGNOSIS — Z510 Encounter for antineoplastic radiation therapy: Secondary | ICD-10-CM | POA: Diagnosis not present

## 2016-11-18 DIAGNOSIS — C3411 Malignant neoplasm of upper lobe, right bronchus or lung: Secondary | ICD-10-CM

## 2016-11-18 DIAGNOSIS — R42 Dizziness and giddiness: Secondary | ICD-10-CM | POA: Diagnosis not present

## 2016-11-18 DIAGNOSIS — E860 Dehydration: Secondary | ICD-10-CM

## 2016-11-18 LAB — CBC WITH DIFFERENTIAL/PLATELET
BASO%: 0.7 % (ref 0.0–2.0)
Basophils Absolute: 0.1 10*3/uL (ref 0.0–0.1)
EOS ABS: 0.2 10*3/uL (ref 0.0–0.5)
EOS%: 1.1 % (ref 0.0–7.0)
HCT: 33.2 % — ABNORMAL LOW (ref 34.8–46.6)
HGB: 10.8 g/dL — ABNORMAL LOW (ref 11.6–15.9)
LYMPH%: 12.5 % — AB (ref 14.0–49.7)
MCH: 28.9 pg (ref 25.1–34.0)
MCHC: 32.5 g/dL (ref 31.5–36.0)
MCV: 88.8 fL (ref 79.5–101.0)
MONO#: 2.5 10*3/uL — ABNORMAL HIGH (ref 0.1–0.9)
MONO%: 13.5 % (ref 0.0–14.0)
NEUT%: 72.2 % (ref 38.4–76.8)
NEUTROS ABS: 13.2 10*3/uL — AB (ref 1.5–6.5)
Platelets: 380 10*3/uL (ref 145–400)
RBC: 3.74 10*6/uL (ref 3.70–5.45)
RDW: 14.4 % (ref 11.2–14.5)
WBC: 18.3 10*3/uL — AB (ref 3.9–10.3)
lymph#: 2.3 10*3/uL (ref 0.9–3.3)

## 2016-11-18 LAB — COMPREHENSIVE METABOLIC PANEL
ALBUMIN: 3.1 g/dL — AB (ref 3.5–5.0)
ALK PHOS: 89 U/L (ref 40–150)
ALT: 9 U/L (ref 0–55)
AST: 13 U/L (ref 5–34)
Anion Gap: 6 mEq/L (ref 3–11)
BUN: 18.3 mg/dL (ref 7.0–26.0)
CO2: 26 mEq/L (ref 22–29)
CREATININE: 1 mg/dL (ref 0.6–1.1)
Calcium: 9.4 mg/dL (ref 8.4–10.4)
Chloride: 104 mEq/L (ref 98–109)
EGFR: 54 mL/min/{1.73_m2} — ABNORMAL LOW (ref >60)
GLUCOSE: 123 mg/dL (ref 70–140)
Potassium: 4.2 mEq/L (ref 3.5–5.1)
SODIUM: 137 meq/L (ref 136–145)
TOTAL PROTEIN: 7.2 g/dL (ref 6.4–8.3)
Total Bilirubin: <0.22 mg/dL (ref 0.20–1.20)

## 2016-11-18 LAB — MAGNESIUM: Magnesium: 2.2 mg/dl (ref 1.5–2.5)

## 2016-11-18 LAB — PATHOLOGIST SMEAR REVIEW

## 2016-11-18 NOTE — Progress Notes (Addendum)
[facility] OFFICE PROGRESS NOTE  DIAGNOSIS: Recurrent small cell lung cancer initially diagnosed as Limited stage (T1a, N0, M0) small cell lung cancer diagnosed in June 2018.  PRIOR THERAPY: right upper lobectomy on 06/26/2016 and the final pathology was consistent with small cell lung cancer measuring 1.6 cm in size.  CURRENT THERAPY: systemic chemotherapy with cisplatin 60 MG/M2 on day 1 and etoposide 120 MG/M2 on days 1, 2 and 3 every 3 weeks.    INTERVAL HISTORY:   Ms. Waltrip returns as scheduled.  She completed cycle 1 cisplatin/etoposide beginning 10/29/2016.  She had a single episode of nausea following the chemotherapy.  No mouth sores.  She had loose stools.  She takes Imodium as needed.  No rash.  About 2 weeks ago she developed increased shortness of breath and wheezing.  She saw Dr. Lamonte Sakai and was referred for a chest CT.  There was no evidence of a pulmonary embolus.  There was decreasing prominence of subcarinal lymph nodes.  There was no acute process to explain her symptoms.  She thinks her breathing is somewhat better.  She has had intermittent dizziness over the past 2-3 days.  No visual disturbance.  No falls.  Objective:  Vital signs in last 24 hours:  Blood pressure (!) 153/61, pulse 83, temperature 98.5 F (36.9 C), temperature source Oral, resp. rate 17, height 5\' 5"  (1.651 m), weight 143 lb 12.8 oz (65.2 kg), SpO2 99 %.    HEENT: No thrush or ulcers. Resp: Lungs clear bilaterally. Cardio: Regular rate and rhythm. GI: Abdomen soft and nontender.  No hepatomegaly. Vascular: No leg edema.  Calves soft and nontender. Neuro: Alert and oriented.  Motor strength 5/5.  Finger to nose intact. Skin: No rash.   Lab Results:  Lab Results  Component Value Date   WBC 18.3 (H) 11/18/2016   HGB 10.8 (L) 11/18/2016   HCT 33.2 (L) 11/18/2016   MCV 88.8 11/18/2016   PLT 380 11/18/2016   NEUTROABS 13.2 (H) 11/18/2016    Imaging:  No results  found.  Medications: I have reviewed the patient's current medications.  Assessment/Plan: 1. History of limited stage small cell lung cancer status post left upper lobectomy in June 2018. Recent imaging studies including CT scan of the chest as well as a PET scan showed concerning findings for disease recurrence with hypermetabolic subcarinal lymphadenopathy. She underwent repeat biopsy of the subcarinal lymph node and the final pathology was consistent with recurrent small cell carcinoma.  She completed cycle 1 cisplatin/etoposide beginning 10/29/2016.  She had a chest CT done 11/15/2016 to evaluate complaints of dyspnea.  The CT showed improvement in subcarinal lymph nodes. 2.   Recent dizziness.  She will increase fluid intake. 3.   Diarrhea following cycle 1 cisplatin/etoposide.  Improved.   Disposition: Ms. Rhines appears stable.  She has completed 1 cycle of cisplatin/etoposide.  The plan is to proceed with cycle 2 as scheduled beginning 11/19/2016.  The dizziness may be related to the recent diarrhea.  She will increase fluid intake.  She will contact the office if the dizziness persists or if she develops any new symptoms.  She will return for a follow-up visit and cycle 3 cisplatin/etoposide in 3 weeks.  She will contact the office in the interim as outlined above or with any other problems.  Patient seen with Dr. [name].    Ned Card ANP/GNP-BC   11/18/2016  10:13 AM  ADDENDUM: Hematology/Oncology Attending: I had a face-to-face encounter with the patient.  I recommended her care plan.  This is a very pleasant 71 years old white female with recurrent small cell lung cancer presented as limited stage disease with hypermetabolic activity in the subcarinal lymphadenopathy.  She is currently undergoing systemic chemotherapy with cisplatin and etoposide status post 1 cycle.  She tolerated the first cycle of her treatment well except for few days of diarrhea followed by dizzy spells  secondary to dehydration.  Her diarrhea has significantly improved.  She was seen recently by her pulmonologist and she had repeat CT of the chest performed on November 15, 2016 and that showed improvement on the subcarinal lymphadenopathy after the first cycle of her systemic chemotherapy. I recommended for the patient to proceed with cycle #2 today as a schedule. For the dehydration and dizzy spells, I encouraged the patient to increase her oral intake of fluid.  Her condition is much better than a few days ago. I will see her back for follow-up visit in 3 weeks for evaluation before starting cycle #3. The patient was advised to call immediately if she has any concerning symptoms in the interval.  Disclaimer: This note was dictated with voice recognition software. Similar sounding words can inadvertently be transcribed and may be missed upon review. Eilleen Kempf, MD 11/19/16

## 2016-11-19 ENCOUNTER — Other Ambulatory Visit: Payer: Self-pay | Admitting: Internal Medicine

## 2016-11-19 ENCOUNTER — Encounter: Payer: Self-pay | Admitting: Adult Health

## 2016-11-19 ENCOUNTER — Ambulatory Visit
Admission: RE | Admit: 2016-11-19 | Discharge: 2016-11-19 | Disposition: A | Discharge status: Home / Self Care | Payer: Medicare Other | Source: Ambulatory Visit | Attending: Radiation Oncology | Admitting: Radiation Oncology

## 2016-11-19 ENCOUNTER — Ambulatory Visit (HOSPITAL_BASED_OUTPATIENT_CLINIC_OR_DEPARTMENT_OTHER): Payer: Medicare Other

## 2016-11-19 ENCOUNTER — Other Ambulatory Visit: Payer: Medicare Other

## 2016-11-19 VITALS — BP 147/71 | HR 86 | Temp 98.1°F | Resp 16

## 2016-11-19 DIAGNOSIS — Z510 Encounter for antineoplastic radiation therapy: Secondary | ICD-10-CM | POA: Diagnosis not present

## 2016-11-19 DIAGNOSIS — C3411 Malignant neoplasm of upper lobe, right bronchus or lung: Secondary | ICD-10-CM | POA: Diagnosis not present

## 2016-11-19 DIAGNOSIS — Z5111 Encounter for antineoplastic chemotherapy: Secondary | ICD-10-CM | POA: Diagnosis not present

## 2016-11-19 MED ORDER — PALONOSETRON HCL INJECTION 0.25 MG/5ML
0.2500 mg | Freq: Once | INTRAVENOUS | Status: AC
  Administered 2016-11-19: 0.25 mg via INTRAVENOUS

## 2016-11-19 MED ORDER — SODIUM CHLORIDE 0.9 % IV SOLN
80.0000 mg/m2 | Freq: Once | INTRAVENOUS | Status: AC
  Administered 2016-11-19: 137 mg via INTRAVENOUS
  Filled 2016-11-19: qty 137

## 2016-11-19 MED ORDER — FOSAPREPITANT DIMEGLUMINE INJECTION 150 MG
Freq: Once | INTRAVENOUS | Status: AC
  Administered 2016-11-19: 13:00:00 via INTRAVENOUS
  Filled 2016-11-19: qty 5

## 2016-11-19 MED ORDER — SODIUM CHLORIDE 0.9% FLUSH
10.0000 mL | INTRAVENOUS | Status: DC | PRN
  Filled 2016-11-19: qty 10

## 2016-11-19 MED ORDER — SODIUM CHLORIDE 0.9 % IV SOLN
100.0000 mg/m2 | Freq: Once | INTRAVENOUS | Status: AC
  Administered 2016-11-19: 170 mg via INTRAVENOUS
  Filled 2016-11-19: qty 8.5

## 2016-11-19 MED ORDER — POTASSIUM CHLORIDE 2 MEQ/ML IV SOLN
Freq: Once | INTRAVENOUS | Status: AC
  Administered 2016-11-19: 11:00:00 via INTRAVENOUS
  Filled 2016-11-19: qty 10

## 2016-11-19 MED ORDER — PALONOSETRON HCL INJECTION 0.25 MG/5ML
INTRAVENOUS | Status: AC
  Filled 2016-11-19: qty 5

## 2016-11-19 MED ORDER — HEPARIN SOD (PORK) LOCK FLUSH 100 UNIT/ML IV SOLN
500.0000 [IU] | Freq: Once | INTRAVENOUS | Status: DC | PRN
  Filled 2016-11-19: qty 5

## 2016-11-19 MED ORDER — SODIUM CHLORIDE 0.9 % IV SOLN
Freq: Once | INTRAVENOUS | Status: AC
  Administered 2016-11-19: 10:00:00 via INTRAVENOUS

## 2016-11-19 NOTE — Patient Instructions (Signed)
[facility] Discharge Instructions for Patients Receiving Chemotherapy  Today you received the following chemotherapy agents Cisplatin; Etoposide  To help prevent nausea and vomiting after your treatment, we encourage you to take your nausea medication as directed  If you develop nausea and vomiting that is not controlled by your nausea medication, call the clinic.   BELOW ARE SYMPTOMS THAT SHOULD BE REPORTED IMMEDIATELY:  *FEVER GREATER THAN 100.5 F  *CHILLS WITH OR WITHOUT FEVER  NAUSEA AND VOMITING THAT IS NOT CONTROLLED WITH YOUR NAUSEA MEDICATION  *UNUSUAL SHORTNESS OF BREATH  *UNUSUAL BRUISING OR BLEEDING  TENDERNESS IN MOUTH AND THROAT WITH OR WITHOUT PRESENCE OF ULCERS  *URINARY PROBLEMS  *BOWEL PROBLEMS  UNUSUAL RASH Items with * indicate a potential emergency and should be followed up as soon as possible.  Feel free to call the clinic should you have any questions or concerns. The clinic phone number is [phone].  Please show the [facility] at check-in to the Emergency Department and triage nurse.

## 2016-11-20 ENCOUNTER — Ambulatory Visit (HOSPITAL_BASED_OUTPATIENT_CLINIC_OR_DEPARTMENT_OTHER): Payer: Medicare Other

## 2016-11-20 ENCOUNTER — Ambulatory Visit
Admission: RE | Admit: 2016-11-20 | Discharge: 2016-11-20 | Disposition: A | Discharge status: Home / Self Care | Payer: Medicare Other | Source: Ambulatory Visit | Attending: Radiation Oncology | Admitting: Radiation Oncology

## 2016-11-20 VITALS — BP 173/79 | HR 88 | Temp 97.7°F | Resp 17

## 2016-11-20 DIAGNOSIS — Z5111 Encounter for antineoplastic chemotherapy: Secondary | ICD-10-CM

## 2016-11-20 DIAGNOSIS — C3411 Malignant neoplasm of upper lobe, right bronchus or lung: Secondary | ICD-10-CM

## 2016-11-20 DIAGNOSIS — Z510 Encounter for antineoplastic radiation therapy: Secondary | ICD-10-CM | POA: Diagnosis not present

## 2016-11-20 MED ORDER — DEXAMETHASONE SODIUM PHOSPHATE 10 MG/ML IJ SOLN
10.0000 mg | Freq: Once | INTRAMUSCULAR | Status: AC
  Administered 2016-11-20: 10 mg via INTRAVENOUS

## 2016-11-20 MED ORDER — SODIUM CHLORIDE 0.9 % IV SOLN
100.0000 mg/m2 | Freq: Once | INTRAVENOUS | Status: AC
  Administered 2016-11-20: 170 mg via INTRAVENOUS
  Filled 2016-11-20: qty 8.5

## 2016-11-20 MED ORDER — DEXAMETHASONE SODIUM PHOSPHATE 10 MG/ML IJ SOLN
INTRAMUSCULAR | Status: AC
  Filled 2016-11-20: qty 1

## 2016-11-20 MED ORDER — SODIUM CHLORIDE 0.9 % IV SOLN
Freq: Once | INTRAVENOUS | Status: AC
  Administered 2016-11-20: 13:00:00 via INTRAVENOUS

## 2016-11-20 NOTE — Patient Instructions (Signed)
[facility] Discharge Instructions for Patients Receiving Chemotherapy  Today you received the following chemotherapy agents: Etoposide   To help prevent nausea and vomiting after your treatment, we encourage you to take your nausea medication as directed.    If you develop nausea and vomiting that is not controlled by your nausea medication, call the clinic.   BELOW ARE SYMPTOMS THAT SHOULD BE REPORTED IMMEDIATELY:  *FEVER GREATER THAN 100.5 F  *CHILLS WITH OR WITHOUT FEVER  NAUSEA AND VOMITING THAT IS NOT CONTROLLED WITH YOUR NAUSEA MEDICATION  *UNUSUAL SHORTNESS OF BREATH  *UNUSUAL BRUISING OR BLEEDING  TENDERNESS IN MOUTH AND THROAT WITH OR WITHOUT PRESENCE OF ULCERS  *URINARY PROBLEMS  *BOWEL PROBLEMS  UNUSUAL RASH Items with * indicate a potential emergency and should be followed up as soon as possible.  Feel free to call the clinic should you have any questions or concerns. The clinic phone number is [phone].  Please show the [facility] at check-in to the Emergency Department and triage nurse.

## 2016-11-21 ENCOUNTER — Ambulatory Visit
Admission: RE | Admit: 2016-11-21 | Discharge: 2016-11-21 | Disposition: A | Discharge status: Home / Self Care | Payer: Medicare Other | Source: Ambulatory Visit | Attending: Radiation Oncology | Admitting: Radiation Oncology

## 2016-11-21 ENCOUNTER — Ambulatory Visit (HOSPITAL_BASED_OUTPATIENT_CLINIC_OR_DEPARTMENT_OTHER): Payer: Medicare Other

## 2016-11-21 DIAGNOSIS — Z5189 Encounter for other specified aftercare: Secondary | ICD-10-CM | POA: Diagnosis not present

## 2016-11-21 DIAGNOSIS — C3411 Malignant neoplasm of upper lobe, right bronchus or lung: Secondary | ICD-10-CM | POA: Diagnosis not present

## 2016-11-21 DIAGNOSIS — Z510 Encounter for antineoplastic radiation therapy: Secondary | ICD-10-CM | POA: Diagnosis not present

## 2016-11-21 DIAGNOSIS — Z5111 Encounter for antineoplastic chemotherapy: Secondary | ICD-10-CM | POA: Diagnosis not present

## 2016-11-21 MED ORDER — SODIUM CHLORIDE 0.9 % IV SOLN
100.0000 mg/m2 | Freq: Once | INTRAVENOUS | Status: AC
  Administered 2016-11-21: 170 mg via INTRAVENOUS
  Filled 2016-11-21: qty 8.5

## 2016-11-21 MED ORDER — DEXAMETHASONE SODIUM PHOSPHATE 10 MG/ML IJ SOLN
INTRAMUSCULAR | Status: AC
  Filled 2016-11-21: qty 1

## 2016-11-21 MED ORDER — DEXAMETHASONE SODIUM PHOSPHATE 10 MG/ML IJ SOLN
10.0000 mg | Freq: Once | INTRAMUSCULAR | Status: AC
  Administered 2016-11-21: 10 mg via INTRAVENOUS

## 2016-11-21 MED ORDER — PEGFILGRASTIM 6 MG/0.6ML ~~LOC~~ PSKT
6.0000 mg | PREFILLED_SYRINGE | Freq: Once | SUBCUTANEOUS | Status: AC
  Administered 2016-11-21: 6 mg via SUBCUTANEOUS
  Filled 2016-11-21: qty 0.6

## 2016-11-21 NOTE — Patient Instructions (Signed)
[facility] Discharge Instructions for Patients Receiving Chemotherapy  Today you received the following chemotherapy agents: Etoposide   To help prevent nausea and vomiting after your treatment, we encourage you to take your nausea medication as directed.    If you develop nausea and vomiting that is not controlled by your nausea medication, call the clinic.   BELOW ARE SYMPTOMS THAT SHOULD BE REPORTED IMMEDIATELY:  *FEVER GREATER THAN 100.5 F  *CHILLS WITH OR WITHOUT FEVER  NAUSEA AND VOMITING THAT IS NOT CONTROLLED WITH YOUR NAUSEA MEDICATION  *UNUSUAL SHORTNESS OF BREATH  *UNUSUAL BRUISING OR BLEEDING  TENDERNESS IN MOUTH AND THROAT WITH OR WITHOUT PRESENCE OF ULCERS  *URINARY PROBLEMS  *BOWEL PROBLEMS  UNUSUAL RASH Items with * indicate a potential emergency and should be followed up as soon as possible.  Feel free to call the clinic should you have any questions or concerns. The clinic phone number is [phone].  Please show the [facility] at check-in to the Emergency Department and triage nurse.

## 2016-11-22 ENCOUNTER — Ambulatory Visit: Payer: Medicare Other | Admitting: Adult Health

## 2016-11-22 ENCOUNTER — Ambulatory Visit
Admission: RE | Admit: 2016-11-22 | Discharge: 2016-11-22 | Disposition: A | Discharge status: Home / Self Care | Payer: Medicare Other | Source: Ambulatory Visit | Attending: Radiation Oncology | Admitting: Radiation Oncology

## 2016-11-22 DIAGNOSIS — Z510 Encounter for antineoplastic radiation therapy: Secondary | ICD-10-CM | POA: Diagnosis not present

## 2016-11-22 DIAGNOSIS — C3411 Malignant neoplasm of upper lobe, right bronchus or lung: Secondary | ICD-10-CM

## 2016-11-22 DIAGNOSIS — R590 Localized enlarged lymph nodes: Secondary | ICD-10-CM

## 2016-11-22 MED ORDER — RADIAPLEXRX EX GEL
Freq: Once | CUTANEOUS | Status: AC
  Administered 2016-11-22: 16:00:00 via TOPICAL

## 2016-11-23 ENCOUNTER — Ambulatory Visit: Payer: Medicare Other

## 2016-11-24 ENCOUNTER — Emergency Department (HOSPITAL_COMMUNITY): Payer: Medicare Other

## 2016-11-24 ENCOUNTER — Other Ambulatory Visit: Payer: Self-pay

## 2016-11-24 ENCOUNTER — Encounter (HOSPITAL_COMMUNITY): Payer: Self-pay | Admitting: Emergency Medicine

## 2016-11-24 ENCOUNTER — Observation Stay (HOSPITAL_COMMUNITY)
Admission: EM | Admit: 2016-11-24 | Discharge: 2016-11-26 | Disposition: A | Discharge status: Home / Self Care | Payer: Medicare Other | Attending: Family Medicine | Admitting: Family Medicine

## 2016-11-24 DIAGNOSIS — M549 Dorsalgia, unspecified: Secondary | ICD-10-CM | POA: Diagnosis not present

## 2016-11-24 DIAGNOSIS — E785 Hyperlipidemia, unspecified: Secondary | ICD-10-CM | POA: Insufficient documentation

## 2016-11-24 DIAGNOSIS — K449 Diaphragmatic hernia without obstruction or gangrene: Secondary | ICD-10-CM | POA: Insufficient documentation

## 2016-11-24 DIAGNOSIS — J449 Chronic obstructive pulmonary disease, unspecified: Secondary | ICD-10-CM | POA: Insufficient documentation

## 2016-11-24 DIAGNOSIS — R109 Unspecified abdominal pain: Secondary | ICD-10-CM | POA: Insufficient documentation

## 2016-11-24 DIAGNOSIS — D72829 Elevated white blood cell count, unspecified: Secondary | ICD-10-CM | POA: Diagnosis present

## 2016-11-24 DIAGNOSIS — Z79899 Other long term (current) drug therapy: Secondary | ICD-10-CM | POA: Diagnosis not present

## 2016-11-24 DIAGNOSIS — Z85118 Personal history of other malignant neoplasm of bronchus and lung: Secondary | ICD-10-CM | POA: Diagnosis not present

## 2016-11-24 DIAGNOSIS — Z87891 Personal history of nicotine dependence: Secondary | ICD-10-CM | POA: Insufficient documentation

## 2016-11-24 DIAGNOSIS — F419 Anxiety disorder, unspecified: Secondary | ICD-10-CM | POA: Diagnosis not present

## 2016-11-24 DIAGNOSIS — K219 Gastro-esophageal reflux disease without esophagitis: Secondary | ICD-10-CM | POA: Diagnosis not present

## 2016-11-24 DIAGNOSIS — R531 Weakness: Secondary | ICD-10-CM | POA: Diagnosis present

## 2016-11-24 LAB — COMPREHENSIVE METABOLIC PANEL
ALT: 17 U/L (ref 14–54)
AST: 20 U/L (ref 15–41)
Albumin: 3.6 g/dL (ref 3.5–5.0)
Alkaline Phosphatase: 104 U/L (ref 38–126)
Anion gap: 12 (ref 5–15)
BUN: 38 mg/dL — ABNORMAL HIGH (ref 6–20)
CHLORIDE: 103 mmol/L (ref 101–111)
CO2: 18 mmol/L — AB (ref 22–32)
CREATININE: 0.98 mg/dL (ref 0.44–1.00)
Calcium: 9.3 mg/dL (ref 8.9–10.3)
GFR calc non Af Amer: 56 mL/min — ABNORMAL LOW (ref >60)
Glucose, Bld: 98 mg/dL (ref 65–99)
POTASSIUM: 4.1 mmol/L (ref 3.5–5.1)
SODIUM: 133 mmol/L — AB (ref 135–145)
Total Bilirubin: 1 mg/dL (ref 0.3–1.2)
Total Protein: 7.1 g/dL (ref 6.5–8.1)

## 2016-11-24 LAB — TROPONIN I: TROPONIN I: 0.06 ng/mL — AB (ref <0.03)

## 2016-11-24 LAB — CBC WITH DIFFERENTIAL/PLATELET
Basophils Absolute: 0 10*3/uL (ref 0.0–0.1)
Basophils Relative: 0 %
EOS ABS: 0 10*3/uL (ref 0.0–0.7)
Eosinophils Relative: 0 %
HCT: 35 % — ABNORMAL LOW (ref 36.0–46.0)
Hemoglobin: 11.9 g/dL — ABNORMAL LOW (ref 12.0–15.0)
Lymphocytes Relative: 2 %
Lymphs Abs: 1.5 10*3/uL (ref 0.7–4.0)
MCH: 29.8 pg (ref 26.0–34.0)
MCHC: 34 g/dL (ref 30.0–36.0)
MCV: 87.5 fL (ref 78.0–100.0)
MONO ABS: 0 10*3/uL — AB (ref 0.1–1.0)
Monocytes Relative: 0 %
NEUTROS PCT: 98 %
Neutro Abs: 72.7 10*3/uL — ABNORMAL HIGH (ref 1.7–7.7)
PLATELETS: 472 10*3/uL — AB (ref 150–400)
RBC: 4 MIL/uL (ref 3.87–5.11)
RDW: 14.7 % (ref 11.5–15.5)
WBC: 74.2 10*3/uL — AB (ref 4.0–10.5)

## 2016-11-24 LAB — URINALYSIS, ROUTINE W REFLEX MICROSCOPIC
Bilirubin Urine: NEGATIVE
Glucose, UA: NEGATIVE mg/dL
Hgb urine dipstick: NEGATIVE
KETONES UR: NEGATIVE mg/dL
Nitrite: NEGATIVE
PROTEIN: NEGATIVE mg/dL
Specific Gravity, Urine: 1.013 (ref 1.005–1.030)
pH: 6 (ref 5.0–8.0)

## 2016-11-24 LAB — I-STAT CG4 LACTIC ACID, ED: Lactic Acid, Venous: 1.05 mmol/L (ref 0.5–1.9)

## 2016-11-24 LAB — LIPASE, BLOOD: Lipase: 22 U/L (ref 11–51)

## 2016-11-24 LAB — CBG MONITORING, ED: GLUCOSE-CAPILLARY: 113 mg/dL — AB (ref 65–99)

## 2016-11-24 MED ORDER — PIPERACILLIN-TAZOBACTAM 3.375 G IVPB
3.3750 g | Freq: Three times a day (TID) | INTRAVENOUS | Status: DC
  Administered 2016-11-25 – 2016-11-26 (×5): 3.375 g via INTRAVENOUS
  Filled 2016-11-24 (×6): qty 50

## 2016-11-24 MED ORDER — ONDANSETRON HCL 4 MG/2ML IJ SOLN
4.0000 mg | Freq: Once | INTRAMUSCULAR | Status: AC
  Administered 2016-11-24: 4 mg via INTRAVENOUS
  Filled 2016-11-24: qty 2

## 2016-11-24 MED ORDER — ONDANSETRON HCL 4 MG PO TABS: 4.0000 mg | ORAL_TABLET | Freq: Four times a day (QID) | ORAL | Status: DC | PRN

## 2016-11-24 MED ORDER — CALCIUM CARBONATE ANTACID 500 MG PO CHEW
1.0000 | CHEWABLE_TABLET | Freq: Every day | ORAL | Status: DC | PRN
  Administered 2016-11-25 (×2): 200 mg via ORAL
  Filled 2016-11-24 (×2): qty 1

## 2016-11-24 MED ORDER — IPRATROPIUM-ALBUTEROL 0.5-2.5 (3) MG/3ML IN SOLN: 3.0000 mL | Freq: Four times a day (QID) | RESPIRATORY_TRACT | Status: DC | PRN

## 2016-11-24 MED ORDER — SODIUM CHLORIDE 0.9 % IV BOLUS (SEPSIS)
1000.0000 mL | Freq: Once | INTRAVENOUS | Status: AC
  Administered 2016-11-24: 1000 mL via INTRAVENOUS

## 2016-11-24 MED ORDER — OXYCODONE HCL 5 MG PO TABS
5.0000 mg | ORAL_TABLET | ORAL | Status: DC | PRN
  Administered 2016-11-25: 5 mg via ORAL
  Filled 2016-11-24: qty 1

## 2016-11-24 MED ORDER — SERTRALINE HCL 100 MG PO TABS
100.0000 mg | ORAL_TABLET | Freq: Every day | ORAL | Status: DC
  Administered 2016-11-25 – 2016-11-26 (×2): 100 mg via ORAL
  Filled 2016-11-24 (×2): qty 1

## 2016-11-24 MED ORDER — PROMETHAZINE HCL 25 MG/ML IJ SOLN: 6.2500 mg | INTRAMUSCULAR | Status: DC | PRN

## 2016-11-24 MED ORDER — SENNOSIDES-DOCUSATE SODIUM 8.6-50 MG PO TABS: 1.0000 | ORAL_TABLET | Freq: Every evening | ORAL | Status: DC | PRN

## 2016-11-24 MED ORDER — ENOXAPARIN SODIUM 40 MG/0.4ML ~~LOC~~ SOLN
40.0000 mg | SUBCUTANEOUS | Status: DC
  Administered 2016-11-24: 40 mg via SUBCUTANEOUS
  Filled 2016-11-24 (×2): qty 0.4

## 2016-11-24 MED ORDER — SODIUM CHLORIDE 0.9 % IV SOLN
INTRAVENOUS | Status: DC
  Administered 2016-11-24 – 2016-11-25 (×2): via INTRAVENOUS

## 2016-11-24 MED ORDER — DIPHENHYDRAMINE-APAP (SLEEP) 25-500 MG PO TABS: 1.0000 | ORAL_TABLET | Freq: Every evening | ORAL | Status: DC | PRN

## 2016-11-24 MED ORDER — IOPAMIDOL (ISOVUE-300) INJECTION 61%
INTRAVENOUS | Status: AC
  Administered 2016-11-24: 100 mL via INTRAVENOUS
  Filled 2016-11-24: qty 100

## 2016-11-24 MED ORDER — ACETAMINOPHEN 325 MG PO TABS: 650.0000 mg | ORAL_TABLET | Freq: Four times a day (QID) | ORAL | Status: DC | PRN

## 2016-11-24 MED ORDER — HYDROMORPHONE HCL 1 MG/ML IJ SOLN
1.0000 mg | Freq: Once | INTRAMUSCULAR | Status: AC
  Administered 2016-11-24: 1 mg via INTRAVENOUS
  Filled 2016-11-24: qty 1

## 2016-11-24 MED ORDER — TIOTROPIUM BROMIDE MONOHYDRATE 18 MCG IN CAPS
18.0000 ug | ORAL_CAPSULE | Freq: Every day | RESPIRATORY_TRACT | Status: DC
  Administered 2016-11-25 – 2016-11-26 (×2): 18 ug via RESPIRATORY_TRACT
  Filled 2016-11-24: qty 5

## 2016-11-24 MED ORDER — PANTOPRAZOLE SODIUM 40 MG PO TBEC
40.0000 mg | DELAYED_RELEASE_TABLET | Freq: Every day | ORAL | Status: DC
  Administered 2016-11-25 – 2016-11-26 (×2): 40 mg via ORAL
  Filled 2016-11-24 (×2): qty 1

## 2016-11-24 MED ORDER — LOPERAMIDE HCL 2 MG PO CAPS: 2.0000 mg | ORAL_CAPSULE | ORAL | Status: DC | PRN

## 2016-11-24 MED ORDER — PIPERACILLIN-TAZOBACTAM 3.375 G IVPB 30 MIN
3.3750 g | Freq: Once | INTRAVENOUS | Status: AC
  Administered 2016-11-24: 3.375 g via INTRAVENOUS
  Filled 2016-11-24: qty 50

## 2016-11-24 MED ORDER — MAGNESIUM CITRATE PO SOLN: 1.0000 | Freq: Once | ORAL | Status: DC | PRN

## 2016-11-24 MED ORDER — ACETAMINOPHEN 650 MG RE SUPP: 650.0000 mg | Freq: Four times a day (QID) | RECTAL | Status: DC | PRN

## 2016-11-24 MED ORDER — IPRATROPIUM BROMIDE 0.02 % IN SOLN: 0.5000 mg | Freq: Four times a day (QID) | RESPIRATORY_TRACT | Status: DC | PRN

## 2016-11-24 MED ORDER — DIPHENHYDRAMINE HCL 25 MG PO CAPS
25.0000 mg | ORAL_CAPSULE | Freq: Every evening | ORAL | Status: DC | PRN
  Administered 2016-11-25: 25 mg via ORAL
  Filled 2016-11-24: qty 1

## 2016-11-24 MED ORDER — MAGNESIUM OXIDE 400 (241.3 MG) MG PO TABS
400.0000 mg | ORAL_TABLET | Freq: Three times a day (TID) | ORAL | Status: DC
  Administered 2016-11-24 – 2016-11-26 (×5): 400 mg via ORAL
  Filled 2016-11-24 (×5): qty 1

## 2016-11-24 MED ORDER — ALBUTEROL SULFATE (2.5 MG/3ML) 0.083% IN NEBU: 2.5000 mg | INHALATION_SOLUTION | Freq: Four times a day (QID) | RESPIRATORY_TRACT | Status: DC | PRN

## 2016-11-24 MED ORDER — VITAMIN D 1000 UNITS PO TABS
2000.0000 [IU] | ORAL_TABLET | Freq: Every day | ORAL | Status: DC
  Administered 2016-11-25 – 2016-11-26 (×2): 2000 [IU] via ORAL
  Filled 2016-11-24 (×2): qty 2

## 2016-11-24 MED ORDER — ONDANSETRON HCL 4 MG/2ML IJ SOLN
4.0000 mg | Freq: Four times a day (QID) | INTRAMUSCULAR | Status: DC | PRN
  Administered 2016-11-25 (×2): 4 mg via INTRAVENOUS
  Filled 2016-11-24 (×2): qty 2

## 2016-11-24 MED ORDER — ATORVASTATIN CALCIUM 40 MG PO TABS
40.0000 mg | ORAL_TABLET | Freq: Every day | ORAL | Status: DC
  Administered 2016-11-24 – 2016-11-25 (×2): 40 mg via ORAL
  Filled 2016-11-24 (×2): qty 1

## 2016-11-24 MED ORDER — BISACODYL 5 MG PO TBEC: 5.0000 mg | DELAYED_RELEASE_TABLET | Freq: Every day | ORAL | Status: DC | PRN

## 2016-11-24 NOTE — ED Provider Notes (Signed)
[facility] DEPT Provider Note   CSN: [id number] Arrival date & time: 11/24/16  1408     History   Chief Complaint Chief Complaint  Patient presents with  . Weakness    HPI Robyn Barton is a 71 y.o. 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  Patient states that for the last 2-3 days she has been weak all over and has poor appetite.  She also has some mild lower back pain for the last several days and took her pain medicine with no relief.  She also felt like a lump in her throat and some swelling in her throat but denies any trouble swallowing or fevers.  Patient was seen by her pulmonary doctor about a week ago and a CT angiogram of the chest that showed no PE but there is known lung cancer.  Patient was seen in the ED several weeks ago for back pain and had normal x-rays at that time and was diagnosed with pyelonephritis and finished a course of keflex.  Patient denies any dysuria or urinary retention or weakness or numbness in the legs.  The history is provided by the patient.    Past Medical History:  Diagnosis Date  . Allergic rhinitis   . Anxiety   . Chest pain   . Elevated TSH    pt denies  . Emphysema lung ([place])   . Enlarged lymph nodes    in chest  . GERD (gastroesophageal reflux disease)   . Goals of care, counseling/discussion 10/21/2016  . History of hiatal hernia   . Hyperlipidemia   . Hypoxemia   . IFG (impaired fasting glucose)   . Osteopenia   . Pneumonia    several  times last 2009  . Pulmonary nodule   . Recurrent major depressive disorder ([place])   . Sleep apnea    mild no cpap needed  . 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 Tobacco dependence   . Varicose veins of both lower extremities   . Vitamin D deficiency     Patient Active Problem List   Diagnosis Date Noted  .  Dyspnea 11/15/2016  . Goals of care, counseling/discussion 10/21/2016  . Encounter for antineoplastic chemotherapy 10/21/2016  . Mediastinal lymphadenopathy   . Small cell lung cancer, right upper lobe ([place]) 08/20/2016  . COPD (chronic obstructive pulmonary disease) ([place]) 08/20/2016  . ARDS (adult respiratory distress syndrome) ([place]) 08/20/2016    Past Surgical History:  Procedure Laterality Date  . ABDOMINAL HYSTERECTOMY     1 ovary left  . INCONTINENCE SURGERY    . right middle lobe lung surgery  06/26/2016   [facility]    OB History    No data available       Home Medications    Prior to Admission medications   Medication Sig Start Date End Date Taking? Authorizing Provider  alendronate (FOSAMAX) 70 MG tablet Take 70 mg by mouth every Monday. Take with a full glass of water on an empty stomach.    Yes [provider]  atorvastatin (LIPITOR) 40 MG tablet Take 40 mg by mouth at bedtime.    Yes [provider]  calcium carbonate (TUMS - DOSED IN MG ELEMENTAL CALCIUM) 500 MG chewable tablet Chew 1 tablet daily as needed by mouth for indigestion or heartburn.   Yes [provider]  Cholecalciferol (  VITAMIN D) 2000 units tablet Take 2,000 Units by mouth daily.   Yes [provider]  diphenhydramine-acetaminophen (TYLENOL PM) 25-500 MG TABS tablet Take 1 tablet at bedtime as needed by mouth (sleep).   Yes [provider]  loperamide (IMODIUM) 2 MG capsule Take 2 mg as needed by mouth for diarrhea or loose stools.   Yes [provider]  magnesium oxide (MAG-OX) 400 MG tablet Take 1 tablet by mouth 3 (three) times daily. 11/12/16  Yes [provider]  omeprazole (PRILOSEC) 40 MG capsule Take 40 mg by mouth daily.  08/01/16  Yes [provider]  prochlorperazine (COMPAZINE) 10 MG tablet Take 1 tablet (10 mg total) by mouth every 6 (six) hours as needed for nausea or vomiting. 10/21/16  Yes [name], MD    sertraline (ZOLOFT) 100 MG tablet Take 100 mg by mouth daily.    Yes [provider]  tiotropium (SPIRIVA) 18 MCG inhalation capsule Place 18 mcg into inhaler and inhale daily.   Yes [provider]  albuterol (PROVENTIL HFA;VENTOLIN HFA) 108 (90 Base) MCG/ACT inhaler Inhale 2 puffs into the lungs every 6 (six) hours as needed for wheezing or shortness of breath.    [provider]  cephALEXin (KEFLEX) 500 MG capsule Take 1 capsule (500 mg total) by mouth 4 (four) times daily. Patient not taking: Reported on 11/24/2016 11/09/16   Isla Pence, MD  HYDROcodone-acetaminophen (NORCO/VICODIN) 5-325 MG tablet Take 1 tablet by mouth every 4 (four) hours as needed. Patient not taking: Reported on 11/24/2016 11/09/16   Isla Pence, MD  loratadine (CLARITIN) 10 MG tablet Take 10 mg by mouth daily as needed (hay fever).    [provider]  Wound Cleansers (RADIAPLEX EX) Apply topically.    [provider]    Family History Family History  Problem Relation Age of Onset  . Cancer Neg Hx     Social History Social History   Tobacco Use  . Smoking status: Former Smoker    Packs/day: 1.50    Years: 50.00    Pack years: 75.00    Types: Cigarettes    Last attempt to quit: 06/14/2016    Years since quitting: 0.4  . Smokeless tobacco: Never Used  Substance Use Topics  . Alcohol use: Yes    Comment: rare  . Drug use: No     Allergies   Patient has no known allergies.   Review of Systems Review of Systems  Gastrointestinal: Positive for abdominal pain.  Neurological: Positive for weakness.  All other systems reviewed and are negative.    Physical Exam Updated Vital Signs BP 122/63 (BP Location: Right Arm)   Pulse 87   Temp 98 F (36.7 C) (Oral)   Resp 12   SpO2 99%   Physical Exam  Constitutional: She is oriented to person, place, and time.  Chronically ill, uncomfortable   HENT:  Head: Normocephalic.  Posterior OP with no  obvious redness or exudates.   Eyes: EOM are normal. Pupils are equal, round, and reactive to light.  Neck:  + bilateral cervical LAD. No obvious stridor. Fullness in the submandibular area   Cardiovascular: Normal rate, regular rhythm and normal heart sounds.  Pulmonary/Chest: Effort normal and breath sounds normal. No stridor. No respiratory distress.  Abdominal: Soft.  Mild epigastric tenderness. Mild lower lumbar vs bilateral CVAT   Musculoskeletal: Normal range of motion.  Neurological: She is alert and oriented to person, place, and time. No cranial nerve deficit. Coordination  normal.  Skin: Skin is warm.  Psychiatric: She has a normal mood and affect.  Nursing note and vitals reviewed.    ED Treatments / Results  Labs (all labs ordered are listed, but only abnormal results are displayed) Labs Reviewed  URINALYSIS, ROUTINE W REFLEX MICROSCOPIC - Abnormal; Notable for the following components:      Result Value   Leukocytes, UA TRACE (*)    Bacteria, UA RARE (*)    Squamous Epithelial / LPF 0-5 (*)    All other components within normal limits  CBC WITH DIFFERENTIAL/PLATELET - Abnormal; Notable for the following components:   WBC 74.2 (*)    Hemoglobin 11.9 (*)    HCT 35.0 (*)    Platelets 472 (*)    Neutro Abs 72.7 (*)    Monocytes Absolute 0.0 (*)    All other components within normal limits  COMPREHENSIVE METABOLIC PANEL - Abnormal; Notable for the following components:   Sodium 133 (*)    CO2 18 (*)    BUN 38 (*)    GFR calc non Af Amer 56 (*)    All other components within normal limits  TROPONIN I - Abnormal; Notable for the following components:   Troponin I 0.06 (*)    All other components within normal limits  CBG MONITORING, ED - Abnormal; Notable for the following components:   Glucose-Capillary 113 (*)    All other components within normal limits  URINE CULTURE  CULTURE, BLOOD (ROUTINE X 2)  CULTURE, BLOOD (ROUTINE X 2)  LIPASE, BLOOD  I-STAT CG4  LACTIC ACID, ED  I-STAT CG4 LACTIC ACID, ED    EKG  EKG Interpretation None       Radiology Dg Chest 2 View  Result Date: 11/24/2016 CLINICAL DATA:  Pt states she has nausea and weakness today. Pt states she has been feeling like she need to vomit today. Pt has n other chest complaints at this time. PT HX: Lung CA- chemo 4 weeks ago, pneumonia, ex smoker 06/2016, right middle lobe lung surgery 06/2016 EXAM: CHEST  2 VIEW COMPARISON:  Chest CT 12/11/2016 FINDINGS: Normal cardiac silhouette. There is volume loss in the RIGHT hemithorax. Surgical staple line noted in the RIGHT lower lobe. No effusion, infiltrate pneumothorax. IMPRESSION: No acute cardiopulmonary process. Postoperative change RIGHT hemithorax are Electronically Signed   By: Suzy Bouchard M.D.   On: 11/24/2016 16:08   Ct Soft Tissue Neck W Contrast  Result Date: 11/24/2016 CLINICAL DATA:  Initial evaluation for generalized pain, neck pain. History of lung cancer. Sensation of lump in throat. EXAM: CT NECK WITH CONTRAST TECHNIQUE: Multidetector CT imaging of the neck was performed using the standard protocol following the bolus administration of intravenous contrast. CONTRAST:  100 cc of Isovue-300. COMPARISON:  None available. FINDINGS: Pharynx and larynx: Oral cavity within normal limits without mass lesion or loculated fluid collection. No definite acute abnormality about the dentition, although evaluation is limited due to extensive streak artifact from dental amalgam. Palatine tonsils grossly symmetric and within normal limits. Parapharyngeal fat preserved. Nasopharynx normal. Retropharyngeal soft tissues within normal limits. Epiglottis normal. Vallecula clear. Remainder of the hypopharynx and supraglottic larynx without acute abnormality. Left piriform sinus is effaced without appreciable discrete mass or other abnormality. True cords fairly symmetric and within normal limits. Subglottic airway clear. Salivary glands:  Salivary glands including the parotid and submandibular glands within normal limits. Thyroid: Subcentimeter hypodense nodule noted within the right thyroid lobe, of doubtful significance. Thyroid otherwise unremarkable. Lymph nodes:  No adenopathy within the neck. Vascular: Normal intravascular enhancement seen throughout the neck. Vascular calcifications present about the carotid bifurcations. Limited intracranial: Unremarkable. Visualized orbits: Visualized globes and orbital soft tissues within normal limits. Mastoids and visualized paranasal sinuses: Small retention cyst noted within the right maxillary sinus. Visualized paranasal sinuses otherwise clear. Visualize mastoids clear. Skeleton: No acute osseus abnormality. No worrisome lytic or blastic osseous lesions. Upper chest: Partially visualized upper chest demonstrates no acute abnormality. Centrilobular and paraseptal emphysema noted. Biapical pleuroparenchymal thickening. Other: None. IMPRESSION: 1. No acute inflammatory process or other abnormality identified within the neck. No mass lesion or adenopathy. 2. Emphysema. Electronically Signed   By: Jeannine Boga M.D.   On: 11/24/2016 17:30   Ct Abdomen Pelvis W Contrast  Result Date: 11/24/2016 CLINICAL DATA:  71 y/o F; generalized weakness and pain. History of lung cancer and chemotherapy starting 4 weeks ago. EXAM: CT ABDOMEN AND PELVIS WITH CONTRAST TECHNIQUE: Multidetector CT imaging of the abdomen and pelvis was performed using the standard protocol following bolus administration of intravenous contrast. CONTRAST:  100 cc Isovue-300 COMPARISON:  11/09/2016 PET-CT. FINDINGS: Lower chest: Postsurgical changes in the right anterior lung base, partially visualized. Small hiatal hernia. Hepatobiliary: No focal liver abnormality is seen. No gallstones, gallbladder wall thickening, or biliary dilatation. Pancreas: Unremarkable. No pancreatic ductal dilatation or surrounding inflammatory changes.  Spleen: Normal in size without focal abnormality. Adrenals/Urinary Tract: Normal adrenal glands. Bilateral renal cysts measuring up to 13 mm in right kidney lower pole. No hydronephrosis. Normal bladder. Stomach/Bowel: Stomach is within normal limits. Appendix not identified, no pericecal inflammation. No evidence of bowel wall thickening, distention, or inflammatory changes. Vascular/Lymphatic: Aortic atherosclerosis. No enlarged abdominal or pelvic lymph nodes. Reproductive: Status post hysterectomy. No adnexal masses. Other: No abdominal wall hernia or abnormality. No abdominopelvic ascites. Musculoskeletal: Moderate multilevel degenerative changes of the spine greatest at L1-L3 where there is moderate to severe disc space narrowing. No acute fracture. IMPRESSION: 1. No acute process identified. 2. Small hiatal hernia. 3. Aortic atherosclerosis. Electronically Signed   By: Kristine Garbe M.D.   On: 11/24/2016 17:03   Ct L-spine No Charge  Result Date: 11/24/2016 CLINICAL DATA:  Initial evaluation for back pain, history of lung cancer. EXAM: CT LUMBAR SPINE WITHOUT CONTRAST TECHNIQUE: Multidetector CT imaging of the lumbar spine was performed without intravenous contrast administration. Multiplanar CT image reconstructions were also generated. COMPARISON:  Prior PET-CT from 09/26/2016. FINDINGS: Segmentation: Normal segmentation. Lowest well-formed disc labeled the L5-S1 level. Alignment: Trace retrolisthesis of L2 on L3 and L3 on L4. Mild dextroscoliosis with apex at L2-3. Exaggeration the normal lumbar lordosis. Vertebrae: Vertebral body heights maintained. No evidence for acute or chronic fracture. No discrete lytic or blastic osseous lesions. Partially visualize sacrum and bony pelvis intact. Paraspinal and other soft tissues: Paraspinous soft tissues demonstrate no acute abnormality. Moderate atherosclerosis. Small simple cyst noted within the inferior pole right kidney. Visualized visceral  structures otherwise unremarkable. Disc levels: L1-2: Chronic intervertebral disc space narrowing with disc desiccation and mild disc bulge. Mild lateral facet hypertrophy. No canal or foraminal stenosis. L2-3: Trace retrolisthesis. Chronic intervertebral disc space narrowing with disc desiccation. Prominent left-sided reactive endplate changes due to scoliotic curvature. Moderate facet hypertrophy. No significant canal stenosis. Mild bilateral L2 foraminal narrowing. L3-4: Trace retrolisthesis. Disc desiccation. Moderate facet hypertrophy. No significant spinal stenosis. Mild to moderate bilateral L3 foraminal narrowing. L4-5: Diffuse disc bulge with mild intervertebral disc space narrowing. Moderate facet and ligamentum flavum hypertrophy. Mild canal with a least  moderate bilateral subarticular stenosis. Moderate bilateral L4 foraminal stenosis. L5-S1: Mild diffuse disc bulge. Advanced bilateral facet arthrosis. No significant canal stenosis. Moderate bilateral L5 foraminal narrowing, right worse than left. IMPRESSION: 1. No acute abnormality within the lumbar spine. 2. Dextroscoliosis with moderate multilevel spondylolysis, most pronounced at L2-3 on the left. 3. Multifactorial degenerative changes with resultant moderate bilateral foraminal narrowing at L3-4 through L5-S1. 4. Moderate to advanced bilateral facet arthrosis at L4-5 and L5-S1. Finding could serve as a source for low back pain. Electronically Signed   By: Jeannine Boga M.D.   On: 11/24/2016 17:40    Procedures Procedures (including critical care time)  Medications Ordered in ED Medications  HYDROmorphone (DILAUDID) injection 1 mg (1 mg Intravenous Given 11/24/16 1546)  sodium chloride 0.9 % bolus 1,000 mL (1,000 mLs Intravenous New Bag/Given 11/24/16 1546)  ondansetron (ZOFRAN) injection 4 mg (4 mg Intravenous Given 11/24/16 1546)  piperacillin-tazobactam (ZOSYN) IVPB 3.375 g (3.375 g Intravenous New Bag/Given 11/24/16 1712)    iopamidol (ISOVUE-300) 61 % injection (100 mLs Intravenous Contrast Given 11/24/16 1621)     Initial Impression / Assessment and Plan / ED Course  I have reviewed the triage vital signs and the nursing notes.  Pertinent labs & imaging results that were available during my care of the patient were reviewed by me and considered in my medical decision making (see chart for details).    Robyn Barton is a 71 y.o. female here with abdominal pain, throat swelling, flank pain. Recently diagnosed with pyelo and finished keflex. Will get labs, repeat UA, get CT neck, CT ab/pel.   6:10 PM WBC 74. Had chemo 4 days ago with nulasta. Unclear if it is reactive vs bacteremia. Given zosyn empirically. UA showed no obvious UTI. CT ab/pel, CT neck and CXR unremarkable. Blood and urine cultures sent. Will admit.   Final Clinical Impressions(s) / ED Diagnoses   Final diagnoses:  Back pain    ED Discharge Orders    None       Drenda Freeze, MD 11/24/16 [phone]

## 2016-11-24 NOTE — ED Notes (Addendum)
Date and time results received: 11/24/16 1712 (use smartphrase ".now" to insert current time)  Test: Troponin Critical Value: 0.06  Name of Provider Notified: Darl Householder  Orders Received? Or Actions Taken?:  Notified provider

## 2016-11-24 NOTE — ED Notes (Signed)
Rn going to start 2nd IV and obtain blood cultures and I-stat lactic at that time. Patient resting at this time.

## 2016-11-24 NOTE — ED Notes (Signed)
Bed: WA20 Expected date:  Expected time:  Means of arrival:  Comments: 71 yo Lung CA, pain

## 2016-11-24 NOTE — H&P (Signed)
History and Physical   [facility] [place] [facility] Admission History and Physical [employer], D.O.    Patient Name: Robyn Barton MR#: [id number] Date of Birth: Sep 23, 1945 Date of Admission: 11/24/2016  Referring MD/NP/PA: Dr. Darl Householder Primary Care Physician: [name], MD  Chief Complaint:  Chief Complaint  Patient presents with  . Weakness    HPI: Robyn Barton is a 71 y.o. 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  Patient was in a usual state of health until 21days ago when she reports decreased appetite, abdominal and low back pain and weakness. Patient's daughter states that she was unable to hold her head up with profound weakness. She also had decreased by mouth intake secondary to poor appetite, nausea and vomiting. She has ha d some diarrhea as well.   Last chemo was four days ago after which she received Neulasta. She had a similar leukocytosis following an injection of Neulasta in late October. Her Neulasta injection was changed to the transdermal version. for convenience of administration. She was recently treated for pyelo with Keflex.    Patient denies fevers/chills, dizziness, chest pain, shortness of breath,  dysuria/frequency, changes in mental status.    Otherwise there has been no change in status. Patient has been taking medication as prescribed and there has been no recent change in medication or diet.    EMS/ED Course: Patient received dilaudid, Zofran, NS and Zosyn empirically. Medical admission has been requested for further management of leukocytosis, sepsis workup.   Review of Systems:  CONSTITUTIONAL: Positive weakness. No fever/chills, fatigue, weight gain/loss, headache. EYES: No blurry or double vision. ENT: No tinnitus, postnasal drip, redness or soreness of the oropharynx. RESPIRATORY: No cough, dyspnea, wheeze.   No hemoptysis.  CARDIOVASCULAR: No chest pain, palpitations, syncope, orthopnea. No lower extremity edema.  GASTROINTESTINAL: Positive abdominal pain, nausea, vomiting, diarrhea. No constipation.  No hematemesis, melena or hematochezia. GENITOURINARY: No dysuria, frequency, hematuria. ENDOCRINE: No polyuria or nocturia. No heat or cold intolerance. HEMATOLOGY: No anemia, bruising, bleeding. INTEGUMENTARY: No rashes, ulcers, lesions. MUSCULOSKELETAL: No arthritis, gout, dyspnea. NEUROLOGIC: No numbness, tingling, ataxia, seizure-type activity, weakness. PSYCHIATRIC: No anxiety, depression, insomnia.   Past Medical History:  Diagnosis Date  . Allergic rhinitis   . Anxiety   . Chest pain   . Elevated TSH    pt denies  . Emphysema lung ([place])   . Enlarged lymph nodes    in chest  . GERD (gastroesophageal reflux disease)   . Goals of care, counseling/discussion 10/21/2016  . History of hiatal hernia   . Hyperlipidemia   . Hypoxemia   . IFG (impaired fasting glucose)   . Osteopenia   . Pneumonia    several  times last 2009  . Pulmonary nodule   . Recurrent major depressive disorder ([place])   . Sleep apnea    mild no cpap needed  . Small cell carcinoma of right lung ([place]) 2018   surgery right middle lobe removed   . Tobacco dependence   . Varicose veins of both lower extremities   . Vitamin D deficiency     Past Surgical History:  Procedure Laterality Date  . ABDOMINAL HYSTERECTOMY     1 ovary left  . INCONTINENCE SURGERY    . right middle lobe lung surgery  06/26/2016   [facility]     reports that she quit smoking about 5 months ago. Her  smoking use included cigarettes. She has a 75.00 pack-year smoking history. she has never used smokeless tobacco. She reports that she drinks alcohol. She reports that she does not use drugs.  No Known Allergies  Family History  Problem Relation Age of Onset  . Cancer Neg Hx     Prior to Admission medications   Medication Sig Start  Date End Date Taking? Authorizing Provider  alendronate (FOSAMAX) 70 MG tablet Take 70 mg by mouth every Monday. Take with a full glass of water on an empty stomach.    Yes [provider]  atorvastatin (LIPITOR) 40 MG tablet Take 40 mg by mouth at bedtime.    Yes [provider]  calcium carbonate (TUMS - DOSED IN MG ELEMENTAL CALCIUM) 500 MG chewable tablet Chew 1 tablet daily as needed by mouth for indigestion or heartburn.   Yes [provider]  Cholecalciferol (VITAMIN D) 2000 units tablet Take 2,000 Units by mouth daily.   Yes [provider]  diphenhydramine-acetaminophen (TYLENOL PM) 25-500 MG TABS tablet Take 1 tablet at bedtime as needed by mouth (sleep).   Yes [provider]  loperamide (IMODIUM) 2 MG capsule Take 2 mg as needed by mouth for diarrhea or loose stools.   Yes [provider]  magnesium oxide (MAG-OX) 400 MG tablet Take 1 tablet by mouth 3 (three) times daily. 11/12/16  Yes [provider]  omeprazole (PRILOSEC) 40 MG capsule Take 40 mg by mouth daily.  08/01/16  Yes [provider]  prochlorperazine (COMPAZINE) 10 MG tablet Take 1 tablet (10 mg total) by mouth every 6 (six) hours as needed for nausea or vomiting. 10/21/16  Yes [name], MD  sertraline (ZOLOFT) 100 MG tablet Take 100 mg by mouth daily.    Yes [provider]  tiotropium (SPIRIVA) 18 MCG inhalation capsule Place 18 mcg into inhaler and inhale daily.   Yes [provider]  albuterol (PROVENTIL HFA;VENTOLIN HFA) 108 (90 Base) MCG/ACT inhaler Inhale 2 puffs into the lungs every 6 (six) hours as needed for wheezing or shortness of breath.    [provider]  cephALEXin (KEFLEX) 500 MG capsule Take 1 capsule (500 mg total) by mouth 4 (four) times daily. Patient not taking: Reported on 11/24/2016 11/09/16   Isla Pence, MD  HYDROcodone-acetaminophen (NORCO/VICODIN) 5-325 MG tablet Take 1 tablet by mouth every  4 (four) hours as needed. Patient not taking: Reported on 11/24/2016 11/09/16   Isla Pence, MD  loratadine (CLARITIN) 10 MG tablet Take 10 mg by mouth daily as needed (hay fever).    [provider]  Wound Cleansers (RADIAPLEX EX) Apply topically.    [provider]    Physical Exam: Vitals:   11/24/16 1656 11/24/16 1700 11/24/16 1715 11/24/16 1730  BP: 122/63 (!) 183/130  (!) 125/59  Pulse: 87 (!) 117 90 87  Resp: 12 19 14 12   Temp:      TempSrc:      SpO2: 99% 97% 97% 98%    GENERAL: 71 y.o.-year-old female patient, chronically ill appearing,  lying in the bed in no acute distress.  Pleasant and cooperative.   HEENT: Head atraumatic, normocephalic. Pupils equal. Mucus membranes moist. NECK: Supple, full range of motion. No JVD, no bruit heard. No thyroid enlargement, no tenderness, no cervical lymphadenopathy. CHEST: Normal breath sounds bilaterally. No wheezing, rales, rhonchi or crackles. No use of accessory muscles of respiration.  No reproducible chest wall tenderness.  CARDIOVASCULAR: S1, S2 normal. No murmurs, rubs,  or gallops. Cap refill <2 seconds. Pulses intact distally.  ABDOMEN: Soft, nondistended, nontender. No rebound, guarding, rigidity. Normoactive bowel sounds present in all four quadrants.  EXTREMITIES: No pedal edema, cyanosis, or clubbing. No calf tenderness or Homan's sign.  NEUROLOGIC: The patient is alert and oriented x 3. Cranial nerves II through XII are grossly intact with no focal sensorimotor deficit. PSYCHIATRIC:  Normal affect, mood, thought content. SKIN: Warm, dry, and intact without obvious rash, lesion, or ulcer.    Labs on Admission:  CBC: Recent Labs  Lab 11/18/16 0926 11/24/16 1520  WBC 18.3* 74.2*  NEUTROABS 13.2* 72.7*  HGB 10.8* 11.9*  HCT 33.2* 35.0*  MCV 88.8 87.5  PLT 380 875*   Basic Metabolic Panel: Recent Labs  Lab 11/18/16 0926 11/24/16 1520  NA 137 133*  K 4.2 4.1  CL  --  103  CO2 26 18*   GLUCOSE 123 98  BUN 18.3 38*  CREATININE 1.0 0.98  CALCIUM 9.4 9.3  MG 2.2  --    GFR: Estimated Creatinine Clearance: 46 mL/min (by C-G formula based on SCr of 0.98 mg/dL). Liver Function Tests: Recent Labs  Lab 11/18/16 0926 11/24/16 1520  AST 13 20  ALT 9 17  ALKPHOS 89 104  BILITOT <0.22 1.0  PROT 7.2 7.1  ALBUMIN 3.1* 3.6   Recent Labs  Lab 11/24/16 1541  LIPASE 22   No results for input(s): AMMONIA in the last 168 hours. Coagulation Profile: No results for input(s): INR, PROTIME in the last 168 hours. Cardiac Enzymes: Recent Labs  Lab 11/24/16 1520  TROPONINI 0.06*   BNP (last 3 results) No results for input(s): PROBNP in the last 8760 hours. HbA1C: No results for input(s): HGBA1C in the last 72 hours. CBG: Recent Labs  Lab 11/24/16 1525  GLUCAP 113*   Lipid Profile: No results for input(s): CHOL, HDL, LDLCALC, TRIG, CHOLHDL, LDLDIRECT in the last 72 hours. Thyroid Function Tests: No results for input(s): TSH, T4TOTAL, FREET4, T3FREE, THYROIDAB in the last 72 hours. Anemia Panel: No results for input(s): VITAMINB12, FOLATE, FERRITIN, TIBC, IRON, RETICCTPCT in the last 72 hours. Urine analysis:    Component Value Date/Time   COLORURINE YELLOW 11/24/2016 [place] 11/24/2016 1541   LABSPEC 1.013 11/24/2016 1541   PHURINE 6.0 11/24/2016 1541   GLUCOSEU NEGATIVE 11/24/2016 1541   HGBUR NEGATIVE 11/24/2016 1541   BILIRUBINUR NEGATIVE 11/24/2016 1541   KETONESUR NEGATIVE 11/24/2016 1541   PROTEINUR NEGATIVE 11/24/2016 1541   NITRITE NEGATIVE 11/24/2016 1541   LEUKOCYTESUR TRACE (A) 11/24/2016 1541   Sepsis Labs: @LABRCNTIP (procalcitonin:4,lacticidven:4) )No results found for this or any previous visit (from the past 240 hour(s)).   Radiological Exams on Admission: Dg Chest 2 View  Result Date: 11/24/2016 CLINICAL DATA:  Pt states she has nausea and weakness today. Pt states she has been feeling like she need to vomit today. Pt  has n other chest complaints at this time. PT HX: Lung CA- chemo 4 weeks ago, pneumonia, ex smoker 06/2016, right middle lobe lung surgery 06/2016 EXAM: CHEST  2 VIEW COMPARISON:  Chest CT 12/11/2016 FINDINGS: Normal cardiac silhouette. There is volume loss in the RIGHT hemithorax. Surgical staple line noted in the RIGHT lower lobe. No effusion, infiltrate pneumothorax. IMPRESSION: No acute cardiopulmonary process. Postoperative change RIGHT hemithorax are Electronically Signed   By: Suzy Bouchard M.D.   On: 11/24/2016 16:08   Ct Soft Tissue Neck W Contrast  Result Date: 11/24/2016 CLINICAL DATA:  Initial evaluation for  generalized pain, neck pain. History of lung cancer. Sensation of lump in throat. EXAM: CT NECK WITH CONTRAST TECHNIQUE: Multidetector CT imaging of the neck was performed using the standard protocol following the bolus administration of intravenous contrast. CONTRAST:  100 cc of Isovue-300. COMPARISON:  None available. FINDINGS: Pharynx and larynx: Oral cavity within normal limits without mass lesion or loculated fluid collection. No definite acute abnormality about the dentition, although evaluation is limited due to extensive streak artifact from dental amalgam. Palatine tonsils grossly symmetric and within normal limits. Parapharyngeal fat preserved. Nasopharynx normal. Retropharyngeal soft tissues within normal limits. Epiglottis normal. Vallecula clear. Remainder of the hypopharynx and supraglottic larynx without acute abnormality. Left piriform sinus is effaced without appreciable discrete mass or other abnormality. True cords fairly symmetric and within normal limits. Subglottic airway clear. Salivary glands: Salivary glands including the parotid and submandibular glands within normal limits. Thyroid: Subcentimeter hypodense nodule noted within the right thyroid lobe, of doubtful significance. Thyroid otherwise unremarkable. Lymph nodes: No adenopathy within the neck. Vascular: Normal  intravascular enhancement seen throughout the neck. Vascular calcifications present about the carotid bifurcations. Limited intracranial: Unremarkable. Visualized orbits: Visualized globes and orbital soft tissues within normal limits. Mastoids and visualized paranasal sinuses: Small retention cyst noted within the right maxillary sinus. Visualized paranasal sinuses otherwise clear. Visualize mastoids clear. Skeleton: No acute osseus abnormality. No worrisome lytic or blastic osseous lesions. Upper chest: Partially visualized upper chest demonstrates no acute abnormality. Centrilobular and paraseptal emphysema noted. Biapical pleuroparenchymal thickening. Other: None. IMPRESSION: 1. No acute inflammatory process or other abnormality identified within the neck. No mass lesion or adenopathy. 2. Emphysema. Electronically Signed   By: Jeannine Boga M.D.   On: 11/24/2016 17:30   Ct Abdomen Pelvis W Contrast  Result Date: 11/24/2016 CLINICAL DATA:  71 y/o F; generalized weakness and pain. History of lung cancer and chemotherapy starting 4 weeks ago. EXAM: CT ABDOMEN AND PELVIS WITH CONTRAST TECHNIQUE: Multidetector CT imaging of the abdomen and pelvis was performed using the standard protocol following bolus administration of intravenous contrast. CONTRAST:  100 cc Isovue-300 COMPARISON:  11/09/2016 PET-CT. FINDINGS: Lower chest: Postsurgical changes in the right anterior lung base, partially visualized. Small hiatal hernia. Hepatobiliary: No focal liver abnormality is seen. No gallstones, gallbladder wall thickening, or biliary dilatation. Pancreas: Unremarkable. No pancreatic ductal dilatation or surrounding inflammatory changes. Spleen: Normal in size without focal abnormality. Adrenals/Urinary Tract: Normal adrenal glands. Bilateral renal cysts measuring up to 13 mm in right kidney lower pole. No hydronephrosis. Normal bladder. Stomach/Bowel: Stomach is within normal limits. Appendix not identified, no  pericecal inflammation. No evidence of bowel wall thickening, distention, or inflammatory changes. Vascular/Lymphatic: Aortic atherosclerosis. No enlarged abdominal or pelvic lymph nodes. Reproductive: Status post hysterectomy. No adnexal masses. Other: No abdominal wall hernia or abnormality. No abdominopelvic ascites. Musculoskeletal: Moderate multilevel degenerative changes of the spine greatest at L1-L3 where there is moderate to severe disc space narrowing. No acute fracture. IMPRESSION: 1. No acute process identified. 2. Small hiatal hernia. 3. Aortic atherosclerosis. Electronically Signed   By: Kristine Garbe M.D.   On: 11/24/2016 17:03   Ct L-spine No Charge  Result Date: 11/24/2016 CLINICAL DATA:  Initial evaluation for back pain, history of lung cancer. EXAM: CT LUMBAR SPINE WITHOUT CONTRAST TECHNIQUE: Multidetector CT imaging of the lumbar spine was performed without intravenous contrast administration. Multiplanar CT image reconstructions were also generated. COMPARISON:  Prior PET-CT from 09/26/2016. FINDINGS: Segmentation: Normal segmentation. Lowest well-formed disc labeled the L5-S1 level. Alignment: Trace retrolisthesis  of L2 on L3 and L3 on L4. Mild dextroscoliosis with apex at L2-3. Exaggeration the normal lumbar lordosis. Vertebrae: Vertebral body heights maintained. No evidence for acute or chronic fracture. No discrete lytic or blastic osseous lesions. Partially visualize sacrum and bony pelvis intact. Paraspinal and other soft tissues: Paraspinous soft tissues demonstrate no acute abnormality. Moderate atherosclerosis. Small simple cyst noted within the inferior pole right kidney. Visualized visceral structures otherwise unremarkable. Disc levels: L1-2: Chronic intervertebral disc space narrowing with disc desiccation and mild disc bulge. Mild lateral facet hypertrophy. No canal or foraminal stenosis. L2-3: Trace retrolisthesis. Chronic intervertebral disc space narrowing with  disc desiccation. Prominent left-sided reactive endplate changes due to scoliotic curvature. Moderate facet hypertrophy. No significant canal stenosis. Mild bilateral L2 foraminal narrowing. L3-4: Trace retrolisthesis. Disc desiccation. Moderate facet hypertrophy. No significant spinal stenosis. Mild to moderate bilateral L3 foraminal narrowing. L4-5: Diffuse disc bulge with mild intervertebral disc space narrowing. Moderate facet and ligamentum flavum hypertrophy. Mild canal with a least moderate bilateral subarticular stenosis. Moderate bilateral L4 foraminal stenosis. L5-S1: Mild diffuse disc bulge. Advanced bilateral facet arthrosis. No significant canal stenosis. Moderate bilateral L5 foraminal narrowing, right worse than left. IMPRESSION: 1. No acute abnormality within the lumbar spine. 2. Dextroscoliosis with moderate multilevel spondylolysis, most pronounced at L2-3 on the left. 3. Multifactorial degenerative changes with resultant moderate bilateral foraminal narrowing at L3-4 through L5-S1. 4. Moderate to advanced bilateral facet arthrosis at L4-5 and L5-S1. Finding could serve as a source for low back pain. Electronically Signed   By: Jeannine Boga M.D.   On: 11/24/2016 17:40    Assessment/Plan  This is a 71 y.o. female with a history of GERD, hyperlipidemia, small cell lung cancer on chemotherapy, now being admitted with:  #. Weakness, Leukocytosis, unclear if reactive 2/2 chemotherapy/Neulasta vs. bacteremia - Admit inpatient - Zosyn empirically - Follow up cultures drawn in ED, check stool culture - Oncology consultation  #. Mild elevation of troponin, - Monitor on telemetry - Serial trops  #. History of HLD - Continue Lipitor  #. History of GERD - Continue Prilosec  #. History of depression, patient notes that while she is not actively suicidal, she does not want to live if she has to experience these symptoms as a side effect of chemotherapy.   - Continue Zoloft -  Consult social work and consider psych  Admission status: Inpatient IV Fluids: NS Diet/Nutrition: Heart healthy Consults called: Oncology  DVT Px: Lovenox, SCDs and early ambulation. Code Status: Full Code  Disposition Plan: To home in 1-2 days  All the records are reviewed and case discussed with ED provider. Management plans discussed with the patient and/or family who express understanding and agree with plan of care.  [employer] D.O. on 11/24/2016 at 6:35 PM CC: Primary care physician; [name], MD   11/24/2016, 6:35 PM

## 2016-11-24 NOTE — Progress Notes (Signed)
PHARMACY NOTE -  [facility] has been consulted for dosing of Zosyn for UTI vs bacteremia in immunocompromised patient. Zosyn 3.375gm IV Q8h infused over 4hrs ordered and need for further dosage adjustment appears unlikely at present.    Will follow cx data & any changes in renal function via electronic surveillance software.   Will sign off at this time.  Please reconsult if a change in clinical status warrants re-evaluation of dosage.  Netta Cedars, PharmD, BCPS 11/24/2016@6 :55 PM

## 2016-11-24 NOTE — ED Notes (Signed)
Patient transported to CT 

## 2016-11-24 NOTE — ED Triage Notes (Addendum)
Patient here from home with complaints of generalized weakness and pain. States that she is unable to get comfortable in bed. Reports having pain medication at home but was under the impression that she could only take it if it was "bone pain" and she just has generalized pain so she has not taken medication. Hx of lung cancer. Reports starting chemo 4 weeks ago. Also states that she feels like there is a lump in her throat.

## 2016-11-25 ENCOUNTER — Ambulatory Visit: Payer: Medicare Other | Admitting: Adult Health

## 2016-11-25 ENCOUNTER — Ambulatory Visit: Payer: Medicare Other

## 2016-11-25 ENCOUNTER — Telehealth: Payer: Self-pay | Admitting: Nurse Practitioner

## 2016-11-25 ENCOUNTER — Inpatient Hospital Stay (HOSPITAL_BASED_OUTPATIENT_CLINIC_OR_DEPARTMENT_OTHER): Payer: Medicare Other

## 2016-11-25 DIAGNOSIS — R079 Chest pain, unspecified: Secondary | ICD-10-CM

## 2016-11-25 DIAGNOSIS — R5383 Other fatigue: Secondary | ICD-10-CM | POA: Diagnosis not present

## 2016-11-25 DIAGNOSIS — D72829 Elevated white blood cell count, unspecified: Secondary | ICD-10-CM | POA: Diagnosis not present

## 2016-11-25 DIAGNOSIS — R45851 Suicidal ideations: Secondary | ICD-10-CM

## 2016-11-25 DIAGNOSIS — R5381 Other malaise: Secondary | ICD-10-CM | POA: Diagnosis not present

## 2016-11-25 LAB — COMPREHENSIVE METABOLIC PANEL
ALK PHOS: 80 U/L (ref 38–126)
ALT: 15 U/L (ref 14–54)
ANION GAP: 8 (ref 5–15)
AST: 17 U/L (ref 15–41)
Albumin: 3.2 g/dL — ABNORMAL LOW (ref 3.5–5.0)
BUN: 33 mg/dL — ABNORMAL HIGH (ref 6–20)
CALCIUM: 8.4 mg/dL — AB (ref 8.9–10.3)
CHLORIDE: 104 mmol/L (ref 101–111)
CO2: 22 mmol/L (ref 22–32)
CREATININE: 0.99 mg/dL (ref 0.44–1.00)
GFR, EST NON AFRICAN AMERICAN: 55 mL/min — AB (ref >60)
Glucose, Bld: 129 mg/dL — ABNORMAL HIGH (ref 65–99)
Potassium: 4 mmol/L (ref 3.5–5.1)
Sodium: 134 mmol/L — ABNORMAL LOW (ref 135–145)
Total Bilirubin: 0.5 mg/dL (ref 0.3–1.2)
Total Protein: 6 g/dL — ABNORMAL LOW (ref 6.5–8.1)

## 2016-11-25 LAB — CBC WITH DIFFERENTIAL/PLATELET
Band Neutrophils: 11 %
Basophils Absolute: 0 10*3/uL (ref 0.0–0.1)
Basophils Relative: 0 %
Blasts: 0 %
Eosinophils Absolute: 0 10*3/uL (ref 0.0–0.7)
Eosinophils Relative: 0 %
HCT: 28.9 % — ABNORMAL LOW (ref 36.0–46.0)
HEMOGLOBIN: 9.8 g/dL — AB (ref 12.0–15.0)
LYMPHS PCT: 1 %
Lymphs Abs: 0.5 10*3/uL — ABNORMAL LOW (ref 0.7–4.0)
MCH: 29.9 pg (ref 26.0–34.0)
MCHC: 33.9 g/dL (ref 30.0–36.0)
MCV: 88.1 fL (ref 78.0–100.0)
MYELOCYTES: 0 %
Metamyelocytes Relative: 0 %
Monocytes Absolute: 0.5 10*3/uL (ref 0.1–1.0)
Monocytes Relative: 1 %
NEUTROS PCT: 87 %
NRBC: 0 /100{WBCs}
Neutro Abs: 48.7 10*3/uL — ABNORMAL HIGH (ref 1.7–7.7)
OTHER: 0 %
PLATELETS: 382 10*3/uL (ref 150–400)
PROMYELOCYTES ABS: 0 %
RBC: 3.28 MIL/uL — AB (ref 3.87–5.11)
RDW: 14.7 % (ref 11.5–15.5)
WBC: 49.7 10*3/uL — AB (ref 4.0–10.5)

## 2016-11-25 LAB — URINE CULTURE: CULTURE: NO GROWTH

## 2016-11-25 LAB — ECHOCARDIOGRAM COMPLETE
HEIGHTINCHES: 65 in
Weight: 2250.46 oz

## 2016-11-25 LAB — TROPONIN I
Troponin I: <0.03 ng/mL (ref <0.03)
Troponin I: <0.03 ng/mL (ref <0.03)

## 2016-11-25 MED ORDER — HYDROMORPHONE HCL 1 MG/ML IJ SOLN
0.5000 mg | Freq: Once | INTRAMUSCULAR | Status: AC
  Administered 2016-11-25: 0.5 mg via INTRAVENOUS

## 2016-11-25 MED ORDER — ZOLPIDEM TARTRATE 5 MG PO TABS
5.0000 mg | ORAL_TABLET | Freq: Once | ORAL | Status: AC
  Administered 2016-11-25: 5 mg via ORAL
  Filled 2016-11-25: qty 1

## 2016-11-25 MED ORDER — ENSURE ENLIVE PO LIQD
237.0000 mL | Freq: Two times a day (BID) | ORAL | Status: DC
  Administered 2016-11-26: 237 mL via ORAL

## 2016-11-25 MED ORDER — ADULT MULTIVITAMIN LIQUID CH
15.0000 mL | Freq: Every day | ORAL | Status: DC
  Administered 2016-11-25 – 2016-11-26 (×2): 15 mL via ORAL
  Filled 2016-11-25 (×2): qty 15

## 2016-11-25 NOTE — Telephone Encounter (Signed)
No additional appts scheduled for 11/5 los - appts aready scheduled and patient in hospital week of 11/12 - no appt for lab scheduled.

## 2016-11-25 NOTE — Progress Notes (Signed)
  Echocardiogram 2D Echocardiogram has been performed.  Bobbye Charleston 11/25/2016, 3:15 PM

## 2016-11-25 NOTE — Progress Notes (Signed)
Per Jenny Reichmann, RN on [facility], patient refuses to have radiation therapy today. Informed Heather, RT on L1 of this finding. Will check in with patient again tomorrow.

## 2016-11-25 NOTE — Progress Notes (Signed)
Initial Nutrition Assessment  INTERVENTION:   -Provide Ensure Enlive po BID, each supplement provides 350 kcal and 20 grams of protein -Provide Magic cup BID with meals, each supplement provides 290 kcal and 9 grams of protein -Provide liquid MVI -Encouraged small, frequent meals -RD will continue to monitor  NUTRITION DIAGNOSIS:   Increased nutrient needs related to cancer and cancer related treatments as evidenced by estimated needs.  GOAL:   Patient will meet greater than or equal to 90% of their needs  MONITOR:   PO intake, Supplement acceptance, Labs, Weight trends, I & O's  REASON FOR ASSESSMENT:   Consult Assessment of nutrition requirement/status  ASSESSMENT:   71 y.o. 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  Patient was in a usual state of health until 21days ago when she reports decreased appetite, abdominal and low back pain and weakness. Patient's daughter states that she was unable to hold her head up with profound weakness. She also had decreased by mouth intake secondary to poor appetite, nausea and vomiting. She has ha d some diarrhea as well.   Pt in room with daughter at bedside. Pt with short term memory loss, so pt's daughter provided additional history. Pt reports issues with constipation and diarrhea ever since a hospitalization in July. After her neulasta treatments she tends to develop N/V and poor appetite. Today pt states her appetite is better and she ate breakfast and ordered lunch. Pt and pt's daughter had many nutrition related questions regarding pt's needs during treatments and to help with her reflux. Encouraged small, frequent meals and to focus on protein foods. Pt states she isn't crazy about Ensure d/t it tasting rich. Suggested she dilute Ensure with whole milk to help with the sweetness. Pt to try this and Magic cups with meals.   Pt denies issues swallowing but states after her radiation treatments she is getting "a lump" in her throat.   Pt reports UBW is ~140 lb. Weight is stable. Pt c/o weakness.   Medications: Vitamin D tablet daily, MAG-OX tablet TID, Protonix tablet daily, Iv Zofran PRN Labs reviewed: Low Na  NUTRITION - FOCUSED PHYSICAL EXAM:  Nutrition focused physical exam shows no sign of depletion of muscle mass or body fat.  Diet Order:  Diet Heart Room service appropriate? Yes; Fluid consistency: Thin  EDUCATION NEEDS:   Education needs have been addressed  Skin:  Skin Assessment: Reviewed RN Assessment  Last BM:  11/11  Height:   Ht Readings from Last 1 Encounters:  11/24/16 5\' 5"  (1.651 m)    Weight:   Wt Readings from Last 1 Encounters:  11/24/16 140 lb 10.5 oz (63.8 kg)    Ideal Body Weight:  56.8 kg  BMI:  Body mass index is 23.41 kg/m.  Estimated Nutritional Needs:   Kcal:  1900-2100  Protein:  95-105g  Fluid:  2L/day  Clayton Bibles, MS, RD, LDN [place] [facility] Pager: [phone] After Hours Pager: [phone]

## 2016-11-25 NOTE — Evaluation (Signed)
Physical Therapy Evaluation Patient Details Name: Robyn Barton MRN: [id number] DOB: 27-Dec-1945 Today's Date: 11/25/2016   History of Present Illness  Robyn Barton is a 71 y.o. female with a known history of GERD, hyperlipidemia, small cell lung cancer on chemotherapy, presents to the emergency department for evaluation of weakness 11/24/16  Clinical Impression  The patient describes feeling weak and expresses concern to return home alone. Daughter is out of town.  Recommend OT consult. Pt admitted with above diagnosis. Pt currently with functional limitations due to the deficits listed below (see PT Problem List).  Pt will benefit from skilled PT to increase their independence and safety with mobility to allow discharge to the venue listed below.       Follow Up Recommendations Home health PT/declines SNF"I've been there and it was aweful".    Equipment Recommendations  (TBD)    Recommendations for Other Services   OT    Precautions / Restrictions Precautions Precautions: Fall      Mobility  Bed Mobility Overal bed mobility: Independent                Transfers Overall transfer level: Needs assistance Equipment used: None Transfers: Sit to/from Stand Sit to Stand: Supervision            Ambulation/Gait Ambulation/Gait assistance: Min guard Ambulation Distance (Feet): 5 Feet Assistive device: None Gait Pattern/deviations: Step-to pattern     General Gait Details: patient mobilized to recliner for breakfast.   Stairs            Wheelchair Mobility    Modified Rankin (Stroke Patients Only)       Balance                                             Pertinent Vitals/Pain Pain Assessment: Faces Faces Pain Scale: Hurts little more Pain Descriptors / Indicators: Aching Pain Intervention(s): Monitored during session;Premedicated before session    Home Living Family/patient expects to be discharged to:: Private  residence Living Arrangements: Alone Available Help at Discharge: Friend(s) Type of Home: House Home Access: Level entry     Home Layout: Two level;Able to live on main level with bedroom/bathroom Home Equipment: None      Prior Function                 Hand Dominance        Extremity/Trunk Assessment   Upper Extremity Assessment Upper Extremity Assessment: Generalized weakness    Lower Extremity Assessment Lower Extremity Assessment: Generalized weakness    Cervical / Trunk Assessment Cervical / Trunk Assessment: Kyphotic  Communication      Cognition Arousal/Alertness: Awake/alert Behavior During Therapy: WFL for tasks assessed/performed Overall Cognitive Status: Within Functional Limits for tasks assessed                                        General Comments      Exercises     Assessment/Plan    PT Assessment Patient needs continued PT services  PT Problem List Decreased strength;Decreased activity tolerance;Decreased mobility;Pain       PT Treatment Interventions Gait training;Functional mobility training;Therapeutic activities;Patient/family education    PT Goals (Current goals can be found in the Care Plan section)  Acute Rehab PT Goals Patient Stated Goal:  to get strong enough to go home PT Goal Formulation: With patient/family Time For Goal Achievement: 12/09/16 Potential to Achieve Goals: Good    Frequency Min 3X/week   Barriers to discharge Decreased caregiver support      Co-evaluation               AM-PAC PT "6 Clicks" Daily Activity  Outcome Measure Difficulty turning over in bed (including adjusting bedclothes, sheets and blankets)?: None Difficulty moving from lying on back to sitting on the side of the bed? : None Difficulty sitting down on and standing up from a chair with arms (e.g., wheelchair, bedside commode, etc,.)?: A Little Help needed moving to and from a bed to chair (including a  wheelchair)?: A Little Help needed walking in hospital room?: A Little Help needed climbing 3-5 steps with a railing? : Total 6 Click Score: 18    End of Session   Activity Tolerance: Patient tolerated treatment well Patient left: in chair;with call bell/phone within reach;with family/visitor present Nurse Communication: Mobility status PT Visit Diagnosis: Difficulty in walking, not elsewhere classified (R26.2)    Time: 6294-7654 PT Time Calculation (min) (ACUTE ONLY): 15 min   Charges:   PT Evaluation $PT Eval Low Complexity: 1 Low     PT G CodesTresa Endo PT [phone]   Claretha Cooper 11/25/2016, 9:42 AM

## 2016-11-25 NOTE — Progress Notes (Signed)
Safety Sitter in pt's room ,pt made comment that she's not sure she wants to live like this and that she has a gun at home. Psych consult ordered. Son Dominica Severin in to visit;Dr. Florene Glen in to speak with him. Dominica Severin taking all his mother's belongings with him including medications.purse,checkbook,wallet with charge cards and some cash and pajama bottoms

## 2016-11-25 NOTE — Progress Notes (Signed)
PROGRESS NOTE    Robyn Barton  QQV:[id number] DOB: 02/11/1945 DOA: 11/24/2016 PCP: [name], MD   Brief Narrative:  Robyn Barton is Robyn Barton 71 y.o. female with Robyn Barton known history of GERD,hyperlipidemia, small cell lung cancer on chemotherapy, presents to the emergency department for evaluation of weakness.  Patient was in Robyn Barton usual state of health until 21days ago when she reports decreased appetite, abdominal and low back pain and weakness. Patient's daughter states that she was unable to hold her head up with profound weakness. She also had decreased by mouth intake secondary to poor appetite, nausea and vomiting. She has ha d some diarrhea as well.   Last chemo was four days ago after which she received Neulasta. She had Brysan Mcevoy similar leukocytosis following an injection of Neulasta in late October. Her Neulasta injection was changed to the transdermal version. for convenience of administration. She was recently treated for pyelo with Keflex.    Patient denies fevers/chills, dizziness, chest pain, shortness of breath,  dysuria/frequency, changes in mental status.    Otherwise there has been no change in status. Patient has been taking medication as prescribed and there has been no recent change in medication or diet.    Assessment & Plan:   Active Problems:   Leukocytosis   #. Weakness, Leukocytosis, Back Pain:  Discussed with Dr. [name] this morning.  He notes that it is likely due to neulasta, can cause musculoskeletal pain and leukocytosis is expected.  Recommended observation for another day on antibiotics then likely discharge.  She has not had any true fevers.  Does have diarrhea, but maybe after chemo?   - Continue zosyn - Follow up cultures drawn in ED - GI path panel, C diff, and stool culture pending - Oncology consultation (discussed with primary [name] as above) - Pain control, antiemetics prn  #. Mild elevation of troponin:  Resolved, no complaint of chest pain on  presentation.  EKG with abnormal R wave progression, T wave inversion in aVL.   - Serial trops with resolved elevation [ ]  echo  #. History of HLD - Continue Lipitor  #. History of GERD - Continue Prilosec  #. History of depression  Suicidal Ideation:  Patient notes that she's not sure if she wants to live like this.  Notes this weekend contemplated ending her life.  Notes she has gun in home.   - Consult psychiatry - Continue Zoloft - Safety sitter  DVT prophylaxis: lovenox Code Status: full  Family Communication: daughter in room Disposition Plan: pending   Consultants:   Oncology  Procedures: (Don't include imaging studies which can be auto populated. Include things that cannot be auto populated i.e. Echo, Carotid and venous dopplers, Foley, Bipap, HD, tubes/drains, wound vac, central lines etc)  Echo pending  Antimicrobials: (specify start and planned stop date. Auto populated tables are space occupying and do not give end dates) Anti-infectives (From admission, onward)   Start     Dose/Rate Route Frequency Ordered Stop   11/25/16 0000  piperacillin-tazobactam (ZOSYN) IVPB 3.375 g     3.375 g 12.5 mL/hr over 240 Minutes Intravenous Every 8 hours 11/24/16 1852     11/24/16 1615  piperacillin-tazobactam (ZOSYN) IVPB 3.375 g     3.375 g 100 mL/hr over 30 Minutes Intravenous  Once 11/24/16 1606 11/24/16 1742           Subjective: Chemo on Friday with neulasta. Saturday felt horribly, weak, could barely hold head up. Diarrhea, last episode yesterday.  Watery  no blood. Tightness in back. Feels like Jeorgia Barton lump in throat. No fevers, but notes her temp runs low. Feeling better overall now.  Objective: Vitals:   11/24/16 1915 11/24/16 1930 11/24/16 2051 11/25/16 0527  BP:  137/65 140/62 121/70  Pulse: 86 86 86 85  Resp: 13 (!) 9 12 15   Temp:   97.6 F (36.4 C) 98.4 F (36.9 C)  TempSrc:   Oral Oral  SpO2: 99% 98% 99% 99%  Weight:   63.8 kg (140 lb 10.5 oz)     Height:   5\' 5"  (1.651 m)     Intake/Output Summary (Last 24 hours) at 11/25/2016 1010 Last data filed at 11/25/2016 0335 Gross per 24 hour  Intake 1851.25 ml  Output -  Net 1851.25 ml   Filed Weights   11/24/16 2051  Weight: 63.8 kg (140 lb 10.5 oz)    Examination:  General exam: Appears calm and comfortable  Respiratory system: Clear to auscultation. Respiratory effort normal. Cardiovascular system: S1 & S2 heard, RRR. No JVD, murmurs, rubs, gallops or clicks. No pedal edema. Gastrointestinal system: Abdomen is nondistended, soft and nontender. No organomegaly or masses felt. Normal bowel sounds heard. MSK: TTP in lumbar spine, paraspinals mostly on L Central nervous system: Alert and oriented. No focal neurological deficits. Extremities: Symmetric 5 x 5 power. Skin: No rashes, lesions or ulcers Psychiatry: Judgement and insight appear normal. Mood & affect appropriate.     Data Reviewed: I have personally reviewed following labs and imaging studies  CBC: Recent Labs  Lab 11/24/16 1520 11/25/16 0222  WBC 74.2* 49.7*  NEUTROABS 72.7* 48.7*  HGB 11.9* 9.8*  HCT 35.0* 28.9*  MCV 87.5 88.1  PLT 472* 536   Basic Metabolic Panel: Recent Labs  Lab 11/24/16 1520 11/25/16 0222  NA 133* 134*  K 4.1 4.0  CL 103 104  CO2 18* 22  GLUCOSE 98 129*  BUN 38* 33*  CREATININE 0.98 0.99  CALCIUM 9.3 8.4*   GFR: Estimated Creatinine Clearance: 45.5 mL/min (by C-G formula based on SCr of 0.99 mg/dL). Liver Function Tests: Recent Labs  Lab 11/24/16 1520 11/25/16 0222  AST 20 17  ALT 17 15  ALKPHOS 104 80  BILITOT 1.0 0.5  PROT 7.1 6.0*  ALBUMIN 3.6 3.2*   Recent Labs  Lab 11/24/16 1541  LIPASE 22   No results for input(s): AMMONIA in the last 168 hours. Coagulation Profile: No results for input(s): INR, PROTIME in the last 168 hours. Cardiac Enzymes: Recent Labs  Lab 11/24/16 1520 11/24/16 2110 11/25/16 0222 11/25/16 0839  TROPONINI 0.06* <0.03 <0.03  <0.03   BNP (last 3 results) No results for input(s): PROBNP in the last 8760 hours. HbA1C: No results for input(s): HGBA1C in the last 72 hours. CBG: Recent Labs  Lab 11/24/16 1525  GLUCAP 113*   Lipid Profile: No results for input(s): CHOL, HDL, LDLCALC, TRIG, CHOLHDL, LDLDIRECT in the last 72 hours. Thyroid Function Tests: No results for input(s): TSH, T4TOTAL, FREET4, T3FREE, THYROIDAB in the last 72 hours. Anemia Panel: No results for input(s): VITAMINB12, FOLATE, FERRITIN, TIBC, IRON, RETICCTPCT in the last 72 hours. Sepsis Labs: Recent Labs  Lab 11/24/16 1737  LATICACIDVEN 1.05    No results found for this or any previous visit (from the past 240 hour(s)).       Radiology Studies: Dg Chest 2 View  Result Date: 11/24/2016 CLINICAL DATA:  Pt states she has nausea and weakness today. Pt states she has been feeling like she  need to vomit today. Pt has n other chest complaints at this time. PT HX: Lung CA- chemo 4 weeks ago, pneumonia, ex smoker 06/2016, right middle lobe lung surgery 06/2016 EXAM: CHEST  2 VIEW COMPARISON:  Chest CT 12/11/2016 FINDINGS: Normal cardiac silhouette. There is volume loss in the RIGHT hemithorax. Surgical staple line noted in the RIGHT lower lobe. No effusion, infiltrate pneumothorax. IMPRESSION: No acute cardiopulmonary process. Postoperative change RIGHT hemithorax are Electronically Signed   By: Suzy Bouchard M.D.   On: 11/24/2016 16:08   Ct Soft Tissue Neck W Contrast  Result Date: 11/24/2016 CLINICAL DATA:  Initial evaluation for generalized pain, neck pain. History of lung cancer. Sensation of lump in throat. EXAM: CT NECK WITH CONTRAST TECHNIQUE: Multidetector CT imaging of the neck was performed using the standard protocol following the bolus administration of intravenous contrast. CONTRAST:  100 cc of Isovue-300. COMPARISON:  None available. FINDINGS: Pharynx and larynx: Oral cavity within normal limits without mass lesion or  loculated fluid collection. No definite acute abnormality about the dentition, although evaluation is limited due to extensive streak artifact from dental amalgam. Palatine tonsils grossly symmetric and within normal limits. Parapharyngeal fat preserved. Nasopharynx normal. Retropharyngeal soft tissues within normal limits. Epiglottis normal. Vallecula clear. Remainder of the hypopharynx and supraglottic larynx without acute abnormality. Left piriform sinus is effaced without appreciable discrete mass or other abnormality. True cords fairly symmetric and within normal limits. Subglottic airway clear. Salivary glands: Salivary glands including the parotid and submandibular glands within normal limits. Thyroid: Subcentimeter hypodense nodule noted within the right thyroid lobe, of doubtful significance. Thyroid otherwise unremarkable. Lymph nodes: No adenopathy within the neck. Vascular: Normal intravascular enhancement seen throughout the neck. Vascular calcifications present about the carotid bifurcations. Limited intracranial: Unremarkable. Visualized orbits: Visualized globes and orbital soft tissues within normal limits. Mastoids and visualized paranasal sinuses: Small retention cyst noted within the right maxillary sinus. Visualized paranasal sinuses otherwise clear. Visualize mastoids clear. Skeleton: No acute osseus abnormality. No worrisome lytic or blastic osseous lesions. Upper chest: Partially visualized upper chest demonstrates no acute abnormality. Centrilobular and paraseptal emphysema noted. Biapical pleuroparenchymal thickening. Other: None. IMPRESSION: 1. No acute inflammatory process or other abnormality identified within the neck. No mass lesion or adenopathy. 2. Emphysema. Electronically Signed   By: Jeannine Boga M.D.   On: 11/24/2016 17:30   Ct Abdomen Pelvis W Contrast  Result Date: 11/24/2016 CLINICAL DATA:  71 y/o F; generalized weakness and pain. History of lung cancer and  chemotherapy starting 4 weeks ago. EXAM: CT ABDOMEN AND PELVIS WITH CONTRAST TECHNIQUE: Multidetector CT imaging of the abdomen and pelvis was performed using the standard protocol following bolus administration of intravenous contrast. CONTRAST:  100 cc Isovue-300 COMPARISON:  11/09/2016 PET-CT. FINDINGS: Lower chest: Postsurgical changes in the right anterior lung base, partially visualized. Small hiatal hernia. Hepatobiliary: No focal liver abnormality is seen. No gallstones, gallbladder wall thickening, or biliary dilatation. Pancreas: Unremarkable. No pancreatic ductal dilatation or surrounding inflammatory changes. Spleen: Normal in size without focal abnormality. Adrenals/Urinary Tract: Normal adrenal glands. Bilateral renal cysts measuring up to 13 mm in right kidney lower pole. No hydronephrosis. Normal bladder. Stomach/Bowel: Stomach is within normal limits. Appendix not identified, no pericecal inflammation. No evidence of bowel wall thickening, distention, or inflammatory changes. Vascular/Lymphatic: Aortic atherosclerosis. No enlarged abdominal or pelvic lymph nodes. Reproductive: Status post hysterectomy. No adnexal masses. Other: No abdominal wall hernia or abnormality. No abdominopelvic ascites. Musculoskeletal: Moderate multilevel degenerative changes of the spine greatest at L1-L3  where there is moderate to severe disc space narrowing. No acute fracture. IMPRESSION: 1. No acute process identified. 2. Small hiatal hernia. 3. Aortic atherosclerosis. Electronically Signed   By: Kristine Garbe M.D.   On: 11/24/2016 17:03   Ct L-spine No Charge  Result Date: 11/24/2016 CLINICAL DATA:  Initial evaluation for back pain, history of lung cancer. EXAM: CT LUMBAR SPINE WITHOUT CONTRAST TECHNIQUE: Multidetector CT imaging of the lumbar spine was performed without intravenous contrast administration. Multiplanar CT image reconstructions were also generated. COMPARISON:  Prior PET-CT from  09/26/2016. FINDINGS: Segmentation: Normal segmentation. Lowest well-formed disc labeled the L5-S1 level. Alignment: Trace retrolisthesis of L2 on L3 and L3 on L4. Mild dextroscoliosis with apex at L2-3. Exaggeration the normal lumbar lordosis. Vertebrae: Vertebral body heights maintained. No evidence for acute or chronic fracture. No discrete lytic or blastic osseous lesions. Partially visualize sacrum and bony pelvis intact. Paraspinal and other soft tissues: Paraspinous soft tissues demonstrate no acute abnormality. Moderate atherosclerosis. Small simple cyst noted within the inferior pole right kidney. Visualized visceral structures otherwise unremarkable. Disc levels: L1-2: Chronic intervertebral disc space narrowing with disc desiccation and mild disc bulge. Mild lateral facet hypertrophy. No canal or foraminal stenosis. L2-3: Trace retrolisthesis. Chronic intervertebral disc space narrowing with disc desiccation. Prominent left-sided reactive endplate changes due to scoliotic curvature. Moderate facet hypertrophy. No significant canal stenosis. Mild bilateral L2 foraminal narrowing. L3-4: Trace retrolisthesis. Disc desiccation. Moderate facet hypertrophy. No significant spinal stenosis. Mild to moderate bilateral L3 foraminal narrowing. L4-5: Diffuse disc bulge with mild intervertebral disc space narrowing. Moderate facet and ligamentum flavum hypertrophy. Mild canal with Eriel Doyon least moderate bilateral subarticular stenosis. Moderate bilateral L4 foraminal stenosis. L5-S1: Mild diffuse disc bulge. Advanced bilateral facet arthrosis. No significant canal stenosis. Moderate bilateral L5 foraminal narrowing, right worse than left. IMPRESSION: 1. No acute abnormality within the lumbar spine. 2. Dextroscoliosis with moderate multilevel spondylolysis, most pronounced at L2-3 on the left. 3. Multifactorial degenerative changes with resultant moderate bilateral foraminal narrowing at L3-4 through L5-S1. 4. Moderate to  advanced bilateral facet arthrosis at L4-5 and L5-S1. Finding could serve as Cipriano Millikan source for low back pain. Electronically Signed   By: Jeannine Boga M.D.   On: 11/24/2016 17:40        Scheduled Meds: . atorvastatin  40 mg Oral QHS  . cholecalciferol  2,000 Units Oral Daily  . enoxaparin (LOVENOX) injection  40 mg Subcutaneous Q24H  . magnesium oxide  400 mg Oral TID  . pantoprazole  40 mg Oral Daily  . sertraline  100 mg Oral Daily  . tiotropium  18 mcg Inhalation Daily   Continuous Infusions: . sodium chloride 75 mL/hr at 11/24/16 2126  . piperacillin-tazobactam (ZOSYN)  IV 3.375 g (11/25/16 0828)     LOS: 1 day    Time spent: over 1 minutes    Fayrene Helper, MD [facility] Pager [phone]  If 7PM-7AM, please contact night-coverage [email or web address] Password [email or web address] 11/25/2016, 10:10 AM

## 2016-11-26 ENCOUNTER — Ambulatory Visit: Payer: Medicare Other

## 2016-11-26 DIAGNOSIS — R5381 Other malaise: Secondary | ICD-10-CM

## 2016-11-26 DIAGNOSIS — R5383 Other fatigue: Secondary | ICD-10-CM

## 2016-11-26 DIAGNOSIS — F419 Anxiety disorder, unspecified: Secondary | ICD-10-CM | POA: Diagnosis not present

## 2016-11-26 DIAGNOSIS — R450 Nervousness: Secondary | ICD-10-CM | POA: Diagnosis not present

## 2016-11-26 DIAGNOSIS — G4700 Insomnia, unspecified: Secondary | ICD-10-CM

## 2016-11-26 DIAGNOSIS — D72829 Elevated white blood cell count, unspecified: Secondary | ICD-10-CM | POA: Diagnosis not present

## 2016-11-26 DIAGNOSIS — Z87891 Personal history of nicotine dependence: Secondary | ICD-10-CM

## 2016-11-26 LAB — CBC WITH DIFFERENTIAL/PLATELET
BASOS PCT: 0 %
Basophils Absolute: 0 10*3/uL (ref 0.0–0.1)
EOS PCT: 0 %
Eosinophils Absolute: 0 10*3/uL (ref 0.0–0.7)
HEMATOCRIT: 28.6 % — AB (ref 36.0–46.0)
Hemoglobin: 9.5 g/dL — ABNORMAL LOW (ref 12.0–15.0)
LYMPHS ABS: 1.4 10*3/uL (ref 0.7–4.0)
Lymphocytes Relative: 11 %
MCH: 29.1 pg (ref 26.0–34.0)
MCHC: 33.2 g/dL (ref 30.0–36.0)
MCV: 87.7 fL (ref 78.0–100.0)
MONO ABS: 0.1 10*3/uL (ref 0.1–1.0)
Monocytes Relative: 1 %
Neutro Abs: 11.5 10*3/uL — ABNORMAL HIGH (ref 1.7–7.7)
Neutrophils Relative %: 88 %
PLATELETS: 255 10*3/uL (ref 150–400)
RBC: 3.26 MIL/uL — ABNORMAL LOW (ref 3.87–5.11)
RDW: 14.2 % (ref 11.5–15.5)
WBC: 13 10*3/uL — ABNORMAL HIGH (ref 4.0–10.5)

## 2016-11-26 LAB — BASIC METABOLIC PANEL
Anion gap: 6 (ref 5–15)
BUN: 27 mg/dL — AB (ref 6–20)
CO2: 24 mmol/L (ref 22–32)
Calcium: 8.5 mg/dL — ABNORMAL LOW (ref 8.9–10.3)
Chloride: 106 mmol/L (ref 101–111)
Creatinine, Ser: 1.08 mg/dL — ABNORMAL HIGH (ref 0.44–1.00)
GFR calc Af Amer: 58 mL/min — ABNORMAL LOW (ref >60)
GFR, EST NON AFRICAN AMERICAN: 50 mL/min — AB (ref >60)
GLUCOSE: 117 mg/dL — AB (ref 65–99)
POTASSIUM: 4.1 mmol/L (ref 3.5–5.1)
Sodium: 136 mmol/L (ref 135–145)

## 2016-11-26 MED ORDER — SERTRALINE HCL 25 MG PO TABS: 125.0000 mg | ORAL_TABLET | Freq: Every day | ORAL | Status: DC

## 2016-11-26 MED ORDER — SERTRALINE HCL 50 MG PO TABS: 125.0000 mg | ORAL_TABLET | Freq: Every day | ORAL | 0 refills | Status: DC

## 2016-11-26 NOTE — Progress Notes (Signed)
Physical Therapy Treatment Patient Details Name: STEPHENIE NAVEJAS MRN: [id number] DOB: 1945/08/17 Today's Date: 11/26/2016    History of Present Illness Alizay Bronkema is a 71 y.o. female with a known history of GERD, hyperlipidemia, small cell lung cancer on chemotherapy, presents to the emergency department for evaluation of weakness 11/24/16    PT Comments    Pt is progressing well; would benefit from from HHPT fro strengthening and  higher level balance activities ; continue to follow in acute setting  Follow Up Recommendations  Home health PT     Equipment Recommendations  None recommended by PT    Recommendations for Other Services       Precautions / Restrictions Precautions Precautions: Fall Restrictions Weight Bearing Restrictions: No    Mobility  Bed Mobility Overal bed mobility: Independent                Transfers Overall transfer level: Needs assistance Equipment used: None Transfers: Sit to/from Stand Sit to Stand: Supervision            Ambulation/Gait Ambulation/Gait assistance: Min guard Ambulation Distance (Feet): 380 Feet Assistive device: None Gait Pattern/deviations: Step-through pattern;Decreased stride length;Narrow base of support     General Gait Details: pt with decr arm swing, decr trunk/hip  rotation, guarded gait but no overt LOB   Stairs            Wheelchair Mobility    Modified Rankin (Stroke Patients Only)       Balance Overall balance assessment: Needs assistance                           High level balance activites: Backward walking;Direction changes;Sudden stops;Head turns;Turns High Level Balance Comments: min/guard for safety during higher level balance activities; pt able to tolerate min perturbations, steppage response and delayed reactions with mod perturbations            Cognition Arousal/Alertness: Awake/alert Behavior During Therapy: WFL for tasks assessed/performed Overall  Cognitive Status: Within Functional Limits for tasks assessed                                        Exercises      General Comments        Pertinent Vitals/Pain Pain Assessment: No/denies pain    Home Living                      Prior Function            PT Goals (current goals can now be found in the care plan section) Acute Rehab PT Goals Patient Stated Goal: to get strong enough to go home PT Goal Formulation: With patient/family Time For Goal Achievement: 12/09/16 Potential to Achieve Goals: Good Progress towards PT goals: Progressing toward goals    Frequency    Min 3X/week      PT Plan Current plan remains appropriate    Co-evaluation              AM-PAC PT "6 Clicks" Daily Activity  Outcome Measure  Difficulty turning over in bed (including adjusting bedclothes, sheets and blankets)?: None Difficulty moving from lying on back to sitting on the side of the bed? : None Difficulty sitting down on and standing up from a chair with arms (e.g., wheelchair, bedside commode, etc,.)?: A Little Help needed moving to  and from a bed to chair (including a wheelchair)?: A Little Help needed walking in hospital room?: A Little Help needed climbing 3-5 steps with a railing? : A Little 6 Click Score: 20    End of Session Equipment Utilized During Treatment: Gait belt Activity Tolerance: Patient tolerated treatment well Patient left: in bed;with call bell/phone within reach;with nursing/sitter in room Nurse Communication: Mobility status PT Visit Diagnosis: Difficulty in walking, not elsewhere classified (R26.2)     Time: 4920-1007 PT Time Calculation (min) (ACUTE ONLY): 19 min  Charges:  $Gait Training: 8-22 mins                    G Codes:          Maximilien Hayashi 12/05/2016, 11:50 AM

## 2016-11-26 NOTE — Care Management CC44 (Signed)
Condition Code 44 Documentation Completed  Patient Details  Name: Robyn Barton MRN: [id number] Date of Birth: 30-Dec-1945   Condition Code 44 given:  Yes Patient signature on Condition Code 44 notice:  Yes Documentation of 2 MD's agreement:  Yes Code 44 added to claim:  Yes  Pt signed electronically with an "X".  Lynnell Catalan, RN 11/26/2016, 9:34 AM

## 2016-11-26 NOTE — Care Management Obs Status (Signed)
[place] NOTIFICATION   Patient Details  Name: Robyn Barton MRN: [id number] Date of Birth: 12-22-1945   Medicare Observation Status Notification Given:  Yes    Lynnell Catalan, RN 11/26/2016, 9:34 AM

## 2016-11-26 NOTE — Consult Note (Signed)
[facility]iatry Consult   Reason for Consult:  Suicide risk assessment Referring Physician:  Dr. Florene Glen Patient Identification: Robyn Barton MRN:  [id number] Principal Diagnosis: Anxiety Diagnosis:   Patient Active Problem List   Diagnosis Date Noted  . Leukocytosis [D72.829] 11/24/2016  . Dyspnea [R06.00] 11/15/2016  . Goals of care, counseling/discussion [Z71.89] 10/21/2016  . Encounter for antineoplastic chemotherapy [Z51.11] 10/21/2016  . Mediastinal lymphadenopathy [R59.0]   . 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 COPD (chronic obstructive pulmonary disease) ([place]) [J44.9] 08/20/2016  . ARDS (adult respiratory distress syndrome) ([place]) [J80] 08/20/2016    Total Time spent with patient: 1 hour  Subjective:   Robyn Barton is a 71 y.o. female patient admitted with weakness and musculoskeletal pain thought thought to be medication induced. Psychiatry was consulted after patient endorsed SI following admission.  HPI:   Per chart review, patient has small lung cancer and is receiving chemotherapy. She presented to the ED for evaluation of weakness. She additionally reported poor appetite secondary to nausea and vomiting, abdominal pain and low back pain for 3 weeks. Her symptoms are thought to be secondary to Neulasta. Yesterday, she made a comment that she's not sure she wants to live like this and has a gun at home. She has also been refusing radiation treatment for the past 2 days. She is prescribed Zoloft 100 mg daily.   On interview, she reports that she was frustrated and discouraged about her situation so she told her treatment team that she could not live this way anymore. She reports that 2-3 days prior to admission she could not lift her head off her pillow due to weakness. She found out that it was due to a medication side effect. She feels much better today since receiving treatment at the hospital. She denies SI. She reports that she  has her two grandchildren and 3 great grandchildren to live for. She reports that she will not make the mistake of making statements like this again. She does admit that she felt overwhelmed about managing her stressors alone but since hospitalization she has reached out to family and friends for assistance with her day to day needs.   Robyn Barton is prescribed Zoloft 100 mg daily for depression and anxiety.  She has been taking this medication for 2-3 years. Her anxiety has worsened recently due to her recurrence of her cancer since October. She reports poor sleep when she has significant stress secondary to worrying. She has lost interest in some activities. She would like to move closer to her children since they make her happy. She denies problems with her appetite. She denies AVH or HI.    Past Psychiatric History: Depression and anxiety  Risk to Self: Is patient at risk for suicide?: No Risk to Others:  None. Denies  Prior Inpatient Therapy:  Denies  Prior Outpatient Therapy:  She is prescribed Zoloft by her PCP.   Past Medical History:  Past Medical History:  Diagnosis Date  . Allergic rhinitis   . Anxiety   . Chest pain   . Elevated TSH    pt denies  . Emphysema lung ([place])   . Enlarged lymph nodes    in chest  . GERD (gastroesophageal reflux disease)   . Goals of care, counseling/discussion 10/21/2016  . History of hiatal hernia   . Hyperlipidemia   . Hypoxemia   . IFG (impaired fasting glucose)   . Osteopenia   . Pneumonia  several  times last 2009  . Pulmonary nodule   . Recurrent major depressive disorder ([place])   . Sleep apnea    mild no cpap needed  . Small cell carcinoma of right lung ([place]) 2018   surgery right middle lobe removed   . Tobacco dependence   . Varicose veins of both lower extremities   . Vitamin D deficiency     Past Surgical History:  Procedure Laterality Date  . ABDOMINAL HYSTERECTOMY     1 ovary left  . INCONTINENCE SURGERY    . right middle  lobe lung surgery  06/26/2016   [facility]   Family History:  Family History  Problem Relation Age of Onset  . Cancer Neg Hx    Family Psychiatric  History: Denies  Social History:  Social History   Substance and Sexual Activity  Alcohol Use Yes   Comment: rare     Social History   Substance and Sexual Activity  Drug Use No    Social History   Socioeconomic History  . Marital status: Divorced    Spouse name: None  . Number of children: None  . Years of education: None  . Highest education level: None  Social Needs  . Financial resource strain: None  . Food insecurity - worry: None  . Food insecurity - inability: None  . Transportation needs - medical: None  . Transportation needs - non-medical: None  Occupational History  . None  Tobacco Use  . Smoking status: Former Smoker    Packs/day: 1.50    Years: 50.00    Pack years: 75.00    Types: Cigarettes    Last attempt to quit: 06/14/2016    Years since quitting: 0.4  . Smokeless tobacco: Never Used  Substance and Sexual Activity  . Alcohol use: Yes    Comment: rare  . Drug use: No  . Sexual activity: No  Other Topics Concern  . None  Social History Narrative  . None   Additional Social History: She lives at home alone. She has SSI and state pension. She denies alcohol or illicit substance use. She smoked 1.5 ppd x 50 years. She quit smoking in June 2018.     Allergies:  No Known Allergies  Labs:  Results for orders placed or performed during the hospital encounter of 11/24/16 (from the past 48 hour(s))  CBC with Differential/Platelet     Status: Abnormal   Collection Time: 11/24/16  3:20 PM  Result Value Ref Range   WBC 74.2 (HH) 4.0 - 10.5 K/uL    Comment: RESULT REPEATED AND VERIFIED CRITICAL RESULT CALLED TO, READ BACK BY AND VERIFIED WITH: ZOLETA,K AT 1600 ON [id number] BY HOOKER,B    RBC 4.00 3.87 - 5.11 MIL/uL   Hemoglobin 11.9 (L) 12.0 - 15.0 g/dL   HCT 35.0 (L) 36.0 - 46.0 %   MCV 87.5 78.0 -  100.0 fL   MCH 29.8 26.0 - 34.0 pg   MCHC 34.0 30.0 - 36.0 g/dL   RDW 14.7 11.5 - 15.5 %   Platelets 472 (H) 150 - 400 K/uL    Comment: RESULT REPEATED AND VERIFIED SPECIMEN CHECKED FOR CLOTS PLATELET COUNT CONFIRMED BY SMEAR    Neutrophils Relative % 98 %   Lymphocytes Relative 2 %   Monocytes Relative 0 %   Eosinophils Relative 0 %   Basophils Relative 0 %   Neutro Abs 72.7 (H) 1.7 - 7.7 K/uL   Lymphs Abs 1.5 0.7 - 4.0 K/uL  Monocytes Absolute 0.0 (L) 0.1 - 1.0 K/uL   Eosinophils Absolute 0.0 0.0 - 0.7 K/uL   Basophils Absolute 0.0 0.0 - 0.1 K/uL   Smear Review MORPHOLOGY UNREMARKABLE   Comprehensive metabolic panel     Status: Abnormal   Collection Time: 11/24/16  3:20 PM  Result Value Ref Range   Sodium 133 (L) 135 - 145 mmol/L   Potassium 4.1 3.5 - 5.1 mmol/L   Chloride 103 101 - 111 mmol/L   CO2 18 (L) 22 - 32 mmol/L   Glucose, Bld 98 65 - 99 mg/dL   BUN 38 (H) 6 - 20 mg/dL   Creatinine, Ser 0.98 0.44 - 1.00 mg/dL   Calcium 9.3 8.9 - 10.3 mg/dL   Total Protein 7.1 6.5 - 8.1 g/dL   Albumin 3.6 3.5 - 5.0 g/dL   AST 20 15 - 41 U/L   ALT 17 14 - 54 U/L   Alkaline Phosphatase 104 38 - 126 U/L   Total Bilirubin 1.0 0.3 - 1.2 mg/dL   GFR calc non Af Amer 56 (L) >60 mL/min   GFR calc Af Amer >60 >60 mL/min    Comment: (NOTE) The eGFR has been calculated using the CKD EPI equation. This calculation has not been validated in all clinical situations. eGFR's persistently <60 mL/min signify possible Chronic Kidney Disease.    Anion gap 12 5 - 15  Troponin I     Status: Abnormal   Collection Time: 11/24/16  3:20 PM  Result Value Ref Range   Troponin I 0.06 (HH) <0.03 ng/mL    Comment: CRITICAL RESULT CALLED TO, READ BACK BY AND VERIFIED WITH: S.THORNTON CHARGE NURSE 11/24/2016 0459 JR   CBG monitoring, ED     Status: Abnormal   Collection Time: 11/24/16  3:25 PM  Result Value Ref Range   Glucose-Capillary 113 (H) 65 - 99 mg/dL  Urinalysis, Routine w reflex  microscopic     Status: Abnormal   Collection Time: 11/24/16  3:41 PM  Result Value Ref Range   Color, Urine YELLOW YELLOW   APPearance CLEAR CLEAR   Specific Gravity, Urine 1.013 1.005 - 1.030   pH 6.0 5.0 - 8.0   Glucose, UA NEGATIVE NEGATIVE mg/dL   Hgb urine dipstick NEGATIVE NEGATIVE   Bilirubin Urine NEGATIVE NEGATIVE   Ketones, ur NEGATIVE NEGATIVE mg/dL   Protein, ur NEGATIVE NEGATIVE mg/dL   Nitrite NEGATIVE NEGATIVE   Leukocytes, UA TRACE (A) NEGATIVE   RBC / HPF 0-5 0 - 5 RBC/hpf   WBC, UA 0-5 0 - 5 WBC/hpf   Bacteria, UA RARE (A) NONE SEEN   Squamous Epithelial / LPF 0-5 (A) NONE SEEN  Urine culture     Status: None   Collection Time: 11/24/16  3:41 PM  Result Value Ref Range   Specimen Description URINE, CLEAN CATCH    Special Requests NONE    Culture      NO GROWTH Performed at [facility], [address] [place], [place] [address]    Report Status 11/25/2016 FINAL   Lipase, blood     Status: None   Collection Time: 11/24/16  3:41 PM  Result Value Ref Range   Lipase 22 11 - 51 U/L  Blood culture (routine x 2)     Status: None (Preliminary result)   Collection Time: 11/24/16  4:52 PM  Result Value Ref Range   Specimen Description BLOOD RIGHT WRIST    Special Requests IN PEDIATRIC BOTTLE Blood Culture adequate  volume    Culture      NO GROWTH < 24 HOURS Performed at [facility] [address], [place], [place] [address]    Report Status PENDING   Blood culture (routine x 2)     Status: None (Preliminary result)   Collection Time: 11/24/16  5:30 PM  Result Value Ref Range   Specimen Description BLOOD BLOOD RIGHT HAND    Special Requests      BOTTLES DRAWN AEROBIC AND ANAEROBIC Blood Culture adequate volume   Culture      NO GROWTH < 24 HOURS Performed at [facility], [place] [address] [place], [place] [address]    Report Status PENDING   I-Stat CG4 Lactic Acid, ED     Status: None   Collection Time: 11/24/16  5:37 PM  Result Value  Ref Range   Lactic Acid, Venous 1.05 0.5 - 1.9 mmol/L  Troponin I     Status: None   Collection Time: 11/24/16  9:10 PM  Result Value Ref Range   Troponin I <0.03 <0.03 ng/mL  Comprehensive metabolic panel     Status: Abnormal   Collection Time: 11/25/16  2:22 AM  Result Value Ref Range   Sodium 134 (L) 135 - 145 mmol/L   Potassium 4.0 3.5 - 5.1 mmol/L   Chloride 104 101 - 111 mmol/L   CO2 22 22 - 32 mmol/L   Glucose, Bld 129 (H) 65 - 99 mg/dL   BUN 33 (H) 6 - 20 mg/dL   Creatinine, Ser 0.99 0.44 - 1.00 mg/dL   Calcium 8.4 (L) 8.9 - 10.3 mg/dL   Total Protein 6.0 (L) 6.5 - 8.1 g/dL   Albumin 3.2 (L) 3.5 - 5.0 g/dL   AST 17 15 - 41 U/L   ALT 15 14 - 54 U/L   Alkaline Phosphatase 80 38 - 126 U/L   Total Bilirubin 0.5 0.3 - 1.2 mg/dL   GFR calc non Af Amer 55 (L) >60 mL/min   GFR calc Af Amer >60 >60 mL/min    Comment: (NOTE) The eGFR has been calculated using the CKD EPI equation. This calculation has not been validated in all clinical situations. eGFR's persistently <60 mL/min signify possible Chronic Kidney Disease.    Anion gap 8 5 - 15  CBC WITH DIFFERENTIAL     Status: Abnormal   Collection Time: 11/25/16  2:22 AM  Result Value Ref Range   WBC 49.7 (H) 4.0 - 10.5 K/uL   RBC 3.28 (L) 3.87 - 5.11 MIL/uL   Hemoglobin 9.8 (L) 12.0 - 15.0 g/dL   HCT 28.9 (L) 36.0 - 46.0 %   MCV 88.1 78.0 - 100.0 fL   MCH 29.9 26.0 - 34.0 pg   MCHC 33.9 30.0 - 36.0 g/dL   RDW 14.7 11.5 - 15.5 %   Platelets 382 150 - 400 K/uL   Neutrophils Relative % 87 %   Lymphocytes Relative 1 %   Monocytes Relative 1 %   Eosinophils Relative 0 %   Basophils Relative 0 %   Band Neutrophils 11 %   Metamyelocytes Relative 0 %   Myelocytes 0 %   Promyelocytes Absolute 0 %   Blasts 0 %   nRBC 0 0 /100 WBC   Other 0 %   Neutro Abs 48.7 (H) 1.7 - 7.7 K/uL   Lymphs Abs 0.5 (L) 0.7 - 4.0 K/uL   Monocytes Absolute 0.5 0.1 - 1.0 K/uL   Eosinophils Absolute 0.0  0.0 - 0.7 K/uL   Basophils Absolute 0.0  0.0 - 0.1 K/uL   Smear Review MORPHOLOGY UNREMARKABLE   Troponin I     Status: None   Collection Time: 11/25/16  2:22 AM  Result Value Ref Range   Troponin I <0.03 <0.03 ng/mL  Troponin I     Status: None   Collection Time: 11/25/16  8:39 AM  Result Value Ref Range   Troponin I <0.03 <0.03 ng/mL  CBC with Differential/Platelet     Status: Abnormal   Collection Time: 11/26/16  3:46 AM  Result Value Ref Range   WBC 13.0 (H) 4.0 - 10.5 K/uL   RBC 3.26 (L) 3.87 - 5.11 MIL/uL   Hemoglobin 9.5 (L) 12.0 - 15.0 g/dL   HCT 28.6 (L) 36.0 - 46.0 %   MCV 87.7 78.0 - 100.0 fL   MCH 29.1 26.0 - 34.0 pg   MCHC 33.2 30.0 - 36.0 g/dL   RDW 14.2 11.5 - 15.5 %   Platelets 255 150 - 400 K/uL   Neutrophils Relative % 88 %   Lymphocytes Relative 11 %   Monocytes Relative 1 %   Eosinophils Relative 0 %   Basophils Relative 0 %   Neutro Abs 11.5 (H) 1.7 - 7.7 K/uL   Lymphs Abs 1.4 0.7 - 4.0 K/uL   Monocytes Absolute 0.1 0.1 - 1.0 K/uL   Eosinophils Absolute 0.0 0.0 - 0.7 K/uL   Basophils Absolute 0.0 0.0 - 0.1 K/uL   WBC Morphology MILD LEFT SHIFT (1-5% METAS, OCC MYELO, OCC BANDS)   Basic metabolic panel     Status: Abnormal   Collection Time: 11/26/16  3:46 AM  Result Value Ref Range   Sodium 136 135 - 145 mmol/L   Potassium 4.1 3.5 - 5.1 mmol/L   Chloride 106 101 - 111 mmol/L   CO2 24 22 - 32 mmol/L   Glucose, Bld 117 (H) 65 - 99 mg/dL   BUN 27 (H) 6 - 20 mg/dL   Creatinine, Ser 1.08 (H) 0.44 - 1.00 mg/dL   Calcium 8.5 (L) 8.9 - 10.3 mg/dL   GFR calc non Af Amer 50 (L) >60 mL/min   GFR calc Af Amer 58 (L) >60 mL/min    Comment: (NOTE) The eGFR has been calculated using the CKD EPI equation. This calculation has not been validated in all clinical situations. eGFR's persistently <60 mL/min signify possible Chronic Kidney Disease.    Anion gap 6 5 - 15    Current Facility-Administered Medications  Medication Dose Route Frequency Provider Last Rate Last Dose  . 0.9 %  sodium chloride  infusion   Intravenous Continuous Hugelmeyer, Alexis, DO 75 mL/hr at 11/25/16 2111    . acetaminophen (TYLENOL) tablet 650 mg  650 mg Oral Q6H PRN Hugelmeyer, Alexis, DO       Or  . acetaminophen (TYLENOL) suppository 650 mg  650 mg Rectal Q6H PRN Hugelmeyer, Alexis, DO      . atorvastatin (LIPITOR) tablet 40 mg  40 mg Oral QHS Hugelmeyer, Alexis, DO   40 mg at 11/25/16 2108  . bisacodyl (DULCOLAX) EC tablet 5 mg  5 mg Oral Daily PRN Hugelmeyer, Alexis, DO      . calcium carbonate (TUMS - dosed in mg elemental calcium) chewable tablet 200 mg of elemental calcium  1 tablet Oral Daily PRN Hugelmeyer, Alexis, DO   200 mg of elemental calcium at 11/25/16 2108  . cholecalciferol (VITAMIN D) tablet 2,000 Units  2,000 Units Oral  Daily Hugelmeyer, Alexis, DO   2,000 Units at 11/25/16 1121  . diphenhydrAMINE (BENADRYL) capsule 25 mg  25 mg Oral QHS PRN Hugelmeyer, Alexis, DO   25 mg at 11/25/16 2220  . enoxaparin (LOVENOX) injection 40 mg  40 mg Subcutaneous Q24H Hugelmeyer, Alexis, DO   40 mg at 11/24/16 2134  . feeding supplement (ENSURE ENLIVE) (ENSURE ENLIVE) liquid 237 mL  237 mL Oral BID BM Elodia Florence., MD      . ipratropium-albuterol (DUONEB) 0.5-2.5 (3) MG/3ML nebulizer solution 3 mL  3 mL Nebulization Q6H PRN Hugelmeyer, Alexis, DO      . loperamide (IMODIUM) capsule 2 mg  2 mg Oral PRN Hugelmeyer, Alexis, DO      . magnesium citrate solution 1 Bottle  1 Bottle Oral Once PRN Hugelmeyer, Alexis, DO      . magnesium oxide (MAG-OX) tablet 400 mg  400 mg Oral TID Hugelmeyer, Alexis, DO   400 mg at 11/25/16 2108  . multivitamin liquid 15 mL  15 mL Oral Daily Elodia Florence., MD   15 mL at 11/25/16 1605  . ondansetron (ZOFRAN) tablet 4 mg  4 mg Oral Q6H PRN Hugelmeyer, Alexis, DO       Or  . ondansetron (ZOFRAN) injection 4 mg  4 mg Intravenous Q6H PRN Hugelmeyer, Alexis, DO   4 mg at 11/25/16 2327  . oxyCODONE (Oxy IR/ROXICODONE) immediate release tablet 5 mg  5 mg Oral Q4H PRN  Hugelmeyer, Alexis, DO   5 mg at 11/25/16 2220  . pantoprazole (PROTONIX) EC tablet 40 mg  40 mg Oral Daily Hugelmeyer, Alexis, DO   40 mg at 11/25/16 1121  . piperacillin-tazobactam (ZOSYN) IVPB 3.375 g  3.375 g Intravenous Q8H Thomes Lolling, RPH 12.5 mL/hr at 11/26/16 0900 3.375 g at 11/26/16 0900  . senna-docusate (Senokot-S) tablet 1 tablet  1 tablet Oral QHS PRN Hugelmeyer, Alexis, DO      . sertraline (ZOLOFT) tablet 100 mg  100 mg Oral Daily Hugelmeyer, Alexis, DO   100 mg at 11/25/16 1121  . tiotropium (SPIRIVA) inhalation capsule 18 mcg  18 mcg Inhalation Daily Hugelmeyer, Alexis, DO   18 mcg at 11/26/16 0809    Musculoskeletal: Strength & Muscle Tone: within normal limits Gait & Station: normal Patient leans: N/A  Psychiatric Specialty Exam: Physical Exam  Nursing note and vitals reviewed. Constitutional: She is oriented to person, place, and time. She appears well-developed and well-nourished.  HENT:  Head: Normocephalic and atraumatic.  Neck: Normal range of motion.  Respiratory: Effort normal.  Musculoskeletal: Normal range of motion.  Neurological: She is alert and oriented to person, place, and time.  Skin: No rash noted.  Psychiatric: She has a normal mood and affect. Her behavior is normal. Judgment and thought content normal.    Review of Systems  Constitutional: Negative for chills and fever.  Gastrointestinal: Negative for nausea and vomiting.  Psychiatric/Behavioral: Positive for depression. Negative for hallucinations, substance abuse and suicidal ideas. The patient is nervous/anxious and has insomnia.     Blood pressure 139/70, pulse 80, temperature 97.9 F (36.6 C), temperature source Oral, resp. rate 16, height 5' 5" (1.651 m), weight 63.8 kg (140 lb 10.5 oz), SpO2 97 %.Body mass index is 23.41 kg/m.  General Appearance: Well Groomed  Eye Contact:  Good  Speech:  Normal Rate  Volume:  Normal  Mood:  Euthymic  Affect:  Labile  Thought Process:   Goal Directed and Linear  Orientation:  Full (  Time, Place, and Person)  Thought Content:  Logical  Suicidal Thoughts:  No  Homicidal Thoughts:  No  Memory:  Immediate;   Good Recent;   Good Remote;   Good  Judgement:  Good  Insight:  Good  Psychomotor Activity:  Normal  Concentration:  Concentration: Good and Attention Span: Good  Recall:  Good  Fund of Knowledge:  Good  Language:  Good  Akathisia:  No  Handed:  Right  AIMS (if indicated):   N/A  Assets:  Communication Skills Desire for Improvement Financial Resources/Insurance Housing Social Support  ADL's:  Intact  Cognition:  WNL  Sleep:   Fair   Assessment: Robyn Barton is 71 y.o. female who was admitted with weakness and musculoskeletal pain thought thought to be medication induced. Psychiatry was consulted after patient endorsed SI following admission. She denies SI and reports making prior statements due to frustration. She is future oriented and is able to identify protective factors to not harm self. She does report worsening anxiety, intermittent poor sleep due to worrying and some loss of interest in previously enjoyable activities since recurrence of her cancer. She is agreeable to increasing Zoloft to treat anxiety and depressive symptoms. She does not warrant inpatient psychiatric hospitalization.   Treatment Plan Summary: -Increase Zoloft to 125 mg daily. If patient tolerates increase and has only minimal improvement in her symptoms then can increase to 150 mg daily after one week.  -Please have unit SW provide patient with therapy resources.  -Discontinue bedside sitter as patient is not an imminent risk of harming self.  -Patient is psychiatrically cleared. Will sign off on patient at this time. Please consult psychiatry again as needed.   Disposition: No evidence of imminent risk to self or others at present.   Patient does not meet criteria for psychiatric inpatient admission.  Faythe Dingwall,  DO 11/26/2016 10:09 AM

## 2016-11-26 NOTE — Progress Notes (Signed)
Per Burman Nieves, RN caring for patient on [address], the patient is refusing radiation therapy again today. Informed Ciar[place], RT on L1 of this finding. Will check patient status again tomorrow.

## 2016-11-26 NOTE — Discharge Summary (Addendum)
Physician Discharge Summary  Robyn Barton JJO:[id number] DOB: 1945/07/17 DOA: 11/24/2016  PCP: [name], MD  Admit date: 11/24/2016 Discharge date: 11/26/2016  Time spent: over 30 minutes minutes  Recommendations for Outpatient Follow-up:  1. Follow up outpatient CBC/CMP 2. Follow up final blood and urine cultures.  3. Follow up with oncology 4. Follow up with PCP 5. Follow up mood and response to increased dose of sertraline.  Consider increase to 150 mg daily after 1 week if tolerating well with no significant improvement in symptoms.    Discharge Diagnoses:  Principal Problem:   Anxiety Active Problems:   Leukocytosis   Discharge Condition: stable  Diet recommendation: heart healthy  Filed Weights   11/24/16 2051  Weight: 63.8 kg (140 lb 10.5 oz)    History of present illness:  Robyn Barton 71 y.o.femalewith Robyn Barton known history of GERD,hyperlipidemia, small cell lung cancer on chemotherapy,presents to the emergency department for evaluation of weakness. Patient was in Robyn Barton usual state of health until 21days ago when she reports decreased appetite, abdominal and low back pain and weakness.Patient's daughter states that she was unable to hold her head up with profound weakness. She also had decreased by mouth intake secondary to poor appetite, nausea and vomiting.She has ha d some diarrhea as well.  Last chemo was four days ago after which she received Neulasta.She had Robyn Barton similar leukocytosis following an injection of Neulasta in late October. Her Neulasta injectionwas changed to the transdermalversion. for convenience of administration.She was recently treated for pyelo with Keflex.   Patient denies fevers/chills, dizziness, chest pain, shortness of breath, dysuria/frequency, changes in mental status.   Otherwise there has been no change in status. Patient has been taking medication as prescribed and there has been no recent change in medication or  diet.     Hospital Course:  #.Weakness, Leukocytosis, Back Pain:  Discussed with Dr. Julien Barton.  He notes that it is likely due to neulasta, can cause musculoskeletal pain and leukocytosis is expected.  Recommended observation on antibiotics and then discharge.  She has not had any true fevers.  Does have diarrhea, but maybe after chemo?  Pt improved on day of discharge with significantly improved symptoms.   - Zosyn d/c'd on day of discharge - Follow up culturesdrawn in ED - GI path panel, C diff, and stool culture not collected with lack of diarrhea - Oncology consultation (discussed with primary Robyn Barton as above) - Pain control, antiemetics prn  #.Mild elevation of troponin:  Resolved, no complaint of chest pain on presentation.  EKG with abnormal R wave progression, T wave inversion in aVL.  Suspect demand ischemia in setting of dehydration.   - Serial trops with resolved elevation [ ]  echo as noted below  #.History of HLD - Continue Lipitor  #. History ofGERD - ContinuePrilosec  #. History ofdepression  Suicidal Ideation:  Patient notes that she's not sure if she wants to live like this.  Notes this weekend contemplated ending her life.  Notes she has gun in home.   Psychiatry saw and cleared patient.  Increased zoloft to 125 mg daily.  Recommended f/u with therapy resources.  Procedures: Echo Study Conclusions  - Left ventricle: The cavity size was normal. There was mild   concentric hypertrophy. Systolic function was normal. The   estimated ejection fraction was in the range of 60% to 65%. Wall   motion was normal; there were no regional wall motion   abnormalities. Left ventricular diastolic function parameters  were normal. - Atrial septum: No defect or patent foramen ovale was identified. - Impressions: No strain imaging done.  Consultations:  oncology  Discharge Exam: Vitals:   11/26/16 0434 11/26/16 0810  BP: 139/70   Pulse: 80   Resp: 16    Temp: 97.9 F (36.6 C)   SpO2: 100% 97%   Feels much better.  Ready to go home. Mood is better today.  Notes she was feeling down and like she didn't want to live this weekend, but she would not actually take her life.  She notes she wouldn't do that to her children.  General: No acute distress. Cardiovascular: Heart sounds show Robyn Barton regular rate, and rhythm. No gallops or rubs. No murmurs. No JVD. Lungs: Clear to auscultation bilaterally with good air movement. No rales, rhonchi or wheezes. Abdomen: Soft, nontender, nondistended with normal active bowel sounds. No masses. No hepatosplenomegaly. Neurological: Alert and oriented 3. Moves all extremities 4 with equal strength. Cranial nerves II through XII grossly intact. Skin: Warm and dry. No rashes or lesions. Extremities: No clubbing or cyanosis. No edema. Pedal pulses 2+. Psychiatric: Mood and affect are normal. Insight and judgment are appropriate.   Discharge Instructions   Discharge Instructions    Call MD for:  extreme fatigue   Complete by:  As directed    Call MD for:  persistant dizziness or light-headedness   Complete by:  As directed    Call MD for:  persistant nausea and vomiting   Complete by:  As directed    Call MD for:  severe uncontrolled pain   Complete by:  As directed    Call MD for:  temperature >100.4   Complete by:  As directed    Diet - low sodium heart healthy   Complete by:  As directed    Discharge instructions   Complete by:  As directed    You were admitted for pain, weakness, fatigue and other generalized symptoms.  We think this was because of the neulasta.  Your blood cultures are so far negative and you seem to feel better today.  Your zoloft was increased.  Please follow up with your primary care provider within the next week.  Please follow up with oncology.  Please consider establishing with psychiatry or with Robyn Barton psychologist or behavioral health counselor for your mood.  Return if you have new,  recurrent, or worsening symptoms.   Increase activity slowly   Complete by:  As directed      Discharge Medication List as of 11/26/2016  2:07 PM    CONTINUE these medications which have CHANGED   Details  sertraline (ZOLOFT) 50 MG tablet Take 2.5 tablets (125 mg total) daily by mouth., Starting Wed 11/27/2016, Until Fri 12/27/2016, Normal      CONTINUE these medications which have NOT CHANGED   Details  albuterol (PROVENTIL HFA;VENTOLIN HFA) 108 (90 Base) MCG/ACT inhaler Inhale 2 puffs into the lungs every 6 (six) hours as needed for wheezing or shortness of breath., Historical Med    alendronate (FOSAMAX) 70 MG tablet Take 70 mg by mouth every Monday. Take with Ilario Dhaliwal full glass of water on an empty stomach. , Historical Med    atorvastatin (LIPITOR) 40 MG tablet Take 40 mg by mouth at bedtime. , Historical Med    calcium carbonate (TUMS - DOSED IN MG ELEMENTAL CALCIUM) 500 MG chewable tablet Chew 1 tablet daily as needed by mouth for indigestion or heartburn., Historical Med    cephALEXin (KEFLEX) 500  MG capsule Take 1 capsule (500 mg total) by mouth 4 (four) times daily., Starting Sat 11/09/2016, Print    Cholecalciferol (VITAMIN D) 2000 units tablet Take 2,000 Units by mouth daily., Historical Med    diphenhydramine-acetaminophen (TYLENOL PM) 25-500 MG TABS tablet Take 1 tablet at bedtime as needed by mouth (sleep)., Historical Med    HYDROcodone-acetaminophen (NORCO/VICODIN) 5-325 MG tablet Take 1 tablet by mouth every 4 (four) hours as needed., Starting Sat 11/09/2016, Print    loperamide (IMODIUM) 2 MG capsule Take 2 mg as needed by mouth for diarrhea or loose stools., Historical Med    loratadine (CLARITIN) 10 MG tablet Take 10 mg by mouth daily as needed (hay fever)., Historical Med    magnesium oxide (MAG-OX) 400 MG tablet Take 1 tablet by mouth 3 (three) times daily., Starting Tue 11/12/2016, Historical Med    omeprazole (PRILOSEC) 40 MG capsule Take 40 mg by mouth daily. ,  Starting Thu 08/01/2016, Historical Med    prochlorperazine (COMPAZINE) 10 MG tablet Take 1 tablet (10 mg total) by mouth every 6 (six) hours as needed for nausea or vomiting., Starting Mon 10/21/2016, Normal    tiotropium (SPIRIVA) 18 MCG inhalation capsule Place 18 mcg into inhaler and inhale daily., Historical Med    Wound Cleansers (RADIAPLEX EX) Apply topically., Historical Med       No Known Allergies    The results of significant diagnostics from this hospitalization (including imaging, microbiology, ancillary and laboratory) are listed below for reference.    Significant Diagnostic Studies: Dg Chest 2 View  Result Date: 11/24/2016 CLINICAL DATA:  Pt states she has nausea and weakness today. Pt states she has been feeling like she need to vomit today. Pt has n other chest complaints at this time. PT HX: Lung CA- chemo 4 weeks ago, pneumonia, ex smoker 06/2016, right middle lobe lung surgery 06/2016 EXAM: CHEST  2 VIEW COMPARISON:  Chest CT 12/11/2016 FINDINGS: Normal cardiac silhouette. There is volume loss in the RIGHT hemithorax. Surgical staple line noted in the RIGHT lower lobe. No effusion, infiltrate pneumothorax. IMPRESSION: No acute cardiopulmonary process. Postoperative change RIGHT hemithorax are Electronically Signed   By: Suzy Bouchard M.D.   On: 11/24/2016 16:08   Dg Lumbar Spine Complete  Result Date: 11/09/2016 CLINICAL DATA:  71 year-old female c/o lower back pain since last night. Reports that she sat in Grayson White chair for Crawford Tamura longer than normal time yesterday and the pain started soon after EXAM: [place] 4+ VIEW COMPARISON:  PET-CT 09/26/2016 FINDINGS: There are degenerative changes in the lumbar spine, most notably at L1-2 and L2-3 where there is disc height loss and facet hypertrophy. Facet hypertrophy also noted at L5-S1. No suspicious lytic or blastic lesions are identified. No acute fracture or traumatic subluxation. IMPRESSION: 1. Moderate degenerative  changes. 2.  No evidence for acute  abnormality. 3. No evidence for metastatic disease. Electronically Signed   By: Nolon Nations M.D.   On: 11/09/2016 09:09   Dg Pelvis 1-2 Views  Result Date: 11/09/2016 CLINICAL DATA:  71 year-old female c/o lower back pain since last night. Reports that she sat in Lianette Broussard chair for Orabelle Rylee longer than normal time yesterday and the pain started soon after the patient is undergoing chemotherapy for lung cancer. EXAM: PELVIS - 1-2 VIEW COMPARISON:  PET-CT on 05/14/2016 FINDINGS: There are degenerative changes in the lower lumbar spine. No acute fracture or subluxation. Bowel gas pattern nonobstructed. IMPRESSION: No evidence for acute  abnormality. Electronically  Signed   By: Nolon Nations M.D.   On: 11/09/2016 09:04   Ct Soft Tissue Neck W Contrast  Result Date: 11/24/2016 CLINICAL DATA:  Initial evaluation for generalized pain, neck pain. History of lung cancer. Sensation of lump in throat. EXAM: CT NECK WITH CONTRAST TECHNIQUE: Multidetector CT imaging of the neck was performed using the standard protocol following the bolus administration of intravenous contrast. CONTRAST:  100 cc of Isovue-300. COMPARISON:  None available. FINDINGS: Pharynx and larynx: Oral cavity within normal limits without mass lesion or loculated fluid collection. No definite acute abnormality about the dentition, although evaluation is limited due to extensive streak artifact from dental amalgam. Palatine tonsils grossly symmetric and within normal limits. Parapharyngeal fat preserved. Nasopharynx normal. Retropharyngeal soft tissues within normal limits. Epiglottis normal. Vallecula clear. Remainder of the hypopharynx and supraglottic larynx without acute abnormality. Left piriform sinus is effaced without appreciable discrete mass or other abnormality. True cords fairly symmetric and within normal limits. Subglottic airway clear. Salivary glands: Salivary glands including the parotid and  submandibular glands within normal limits. Thyroid: Subcentimeter hypodense nodule noted within the right thyroid lobe, of doubtful significance. Thyroid otherwise unremarkable. Lymph nodes: No adenopathy within the neck. Vascular: Normal intravascular enhancement seen throughout the neck. Vascular calcifications present about the carotid bifurcations. Limited intracranial: Unremarkable. Visualized orbits: Visualized globes and orbital soft tissues within normal limits. Mastoids and visualized paranasal sinuses: Small retention cyst noted within the right maxillary sinus. Visualized paranasal sinuses otherwise clear. Visualize mastoids clear. Skeleton: No acute osseus abnormality. No worrisome lytic or blastic osseous lesions. Upper chest: Partially visualized upper chest demonstrates no acute abnormality. Centrilobular and paraseptal emphysema noted. Biapical pleuroparenchymal thickening. Other: None. IMPRESSION: 1. No acute inflammatory process or other abnormality identified within the neck. No mass lesion or adenopathy. 2. Emphysema. Electronically Signed   By: Jeannine Boga M.D.   On: 11/24/2016 17:30   Ct Angio Chest W/cm &/or Wo Cm  Result Date: 11/15/2016 CLINICAL DATA:  Right small cell lung cancer. Pulmonary embolus suspected. Shortness of breath. EXAM: CT ANGIOGRAPHY CHEST WITH CONTRAST TECHNIQUE: Multidetector CT imaging of the chest was performed using the standard protocol during bolus administration of intravenous contrast. Multiplanar CT image reconstructions and MIPs were obtained to evaluate the vascular anatomy. CONTRAST:  100 cc Isovue 370 IV COMPARISON:  PET CT 09/26/2016.  Chest CT 09/14/2016. FINDINGS: Cardiovascular: No filling defects in the pulmonary artery is to suggest pulmonary emboli. Eft cirrhotic calcifications in the ascending thoracic aorta and aortic arch. No aneurysm. Heart is upper limits normal in size. Mediastinum/Nodes: Subcarinal adenopathy has Monya Kozakiewicz short axis  diameter of 11 mm compared with 19 mm previously. Right paratracheal lymph node has Abagael Kramm short axis diameter of 8 mm compared 8 mm previously. No visible hilar or axillary adenopathy. Lungs/Pleura: Status post right middle lobectomy. Biapical scarring. No confluent opacities or suspicious pulmonary nodules. No pleural effusions. Upper Abdomen: Imaging into the upper abdomen shows no acute findings. Musculoskeletal: Chest wall soft tissues are unremarkable. No acute bony abnormality. Review of the MIP images confirms the above findings. IMPRESSION: No evidence of pulmonary embolus. Postoperative changes on the right. Decreasing prominence of subcarinal lymph nodes. Otherwise stable small scattered mediastinal lymph nodes. No acute cardiopulmonary process. Electronically Signed   By: Rolm Baptise M.D.   On: 11/15/2016 11:35   Ct Abdomen Pelvis W Contrast  Result Date: 11/24/2016 CLINICAL DATA:  71 y/o F; generalized weakness and pain. History of lung cancer and chemotherapy starting 4 weeks ago.  EXAM: CT ABDOMEN AND PELVIS WITH CONTRAST TECHNIQUE: Multidetector CT imaging of the abdomen and pelvis was performed using the standard protocol following bolus administration of intravenous contrast. CONTRAST:  100 cc Isovue-300 COMPARISON:  11/09/2016 PET-CT. FINDINGS: Lower chest: Postsurgical changes in the right anterior lung base, partially visualized. Small hiatal hernia. Hepatobiliary: No focal liver abnormality is seen. No gallstones, gallbladder wall thickening, or biliary dilatation. Pancreas: Unremarkable. No pancreatic ductal dilatation or surrounding inflammatory changes. Spleen: Normal in size without focal abnormality. Adrenals/Urinary Tract: Normal adrenal glands. Bilateral renal cysts measuring up to 13 mm in right kidney lower pole. No hydronephrosis. Normal bladder. Stomach/Bowel: Stomach is within normal limits. Appendix not identified, no pericecal inflammation. No evidence of bowel wall thickening,  distention, or inflammatory changes. Vascular/Lymphatic: Aortic atherosclerosis. No enlarged abdominal or pelvic lymph nodes. Reproductive: Status post hysterectomy. No adnexal masses. Other: No abdominal wall hernia or abnormality. No abdominopelvic ascites. Musculoskeletal: Moderate multilevel degenerative changes of the spine greatest at L1-L3 where there is moderate to severe disc space narrowing. No acute fracture. IMPRESSION: 1. No acute process identified. 2. Small hiatal hernia. 3. Aortic atherosclerosis. Electronically Signed   By: Kristine Garbe M.D.   On: 11/24/2016 17:03   Ct L-spine No Charge  Result Date: 11/24/2016 CLINICAL DATA:  Initial evaluation for back pain, history of lung cancer. EXAM: CT LUMBAR SPINE WITHOUT CONTRAST TECHNIQUE: Multidetector CT imaging of the lumbar spine was performed without intravenous contrast administration. Multiplanar CT image reconstructions were also generated. COMPARISON:  Prior PET-CT from 09/26/2016. FINDINGS: Segmentation: Normal segmentation. Lowest well-formed disc labeled the L5-S1 level. Alignment: Trace retrolisthesis of L2 on L3 and L3 on L4. Mild dextroscoliosis with apex at L2-3. Exaggeration the normal lumbar lordosis. Vertebrae: Vertebral body heights maintained. No evidence for acute or chronic fracture. No discrete lytic or blastic osseous lesions. Partially visualize sacrum and bony pelvis intact. Paraspinal and other soft tissues: Paraspinous soft tissues demonstrate no acute abnormality. Moderate atherosclerosis. Small simple cyst noted within the inferior pole right kidney. Visualized visceral structures otherwise unremarkable. Disc levels: L1-2: Chronic intervertebral disc space narrowing with disc desiccation and mild disc bulge. Mild lateral facet hypertrophy. No canal or foraminal stenosis. L2-3: Trace retrolisthesis. Chronic intervertebral disc space narrowing with disc desiccation. Prominent left-sided reactive endplate  changes due to scoliotic curvature. Moderate facet hypertrophy. No significant canal stenosis. Mild bilateral L2 foraminal narrowing. L3-4: Trace retrolisthesis. Disc desiccation. Moderate facet hypertrophy. No significant spinal stenosis. Mild to moderate bilateral L3 foraminal narrowing. L4-5: Diffuse disc bulge with mild intervertebral disc space narrowing. Moderate facet and ligamentum flavum hypertrophy. Mild canal with Amore Grater least moderate bilateral subarticular stenosis. Moderate bilateral L4 foraminal stenosis. L5-S1: Mild diffuse disc bulge. Advanced bilateral facet arthrosis. No significant canal stenosis. Moderate bilateral L5 foraminal narrowing, right worse than left. IMPRESSION: 1. No acute abnormality within the lumbar spine. 2. Dextroscoliosis with moderate multilevel spondylolysis, most pronounced at L2-3 on the left. 3. Multifactorial degenerative changes with resultant moderate bilateral foraminal narrowing at L3-4 through L5-S1. 4. Moderate to advanced bilateral facet arthrosis at L4-5 and L5-S1. Finding could serve as Marthella Osorno source for low back pain. Electronically Signed   By: Jeannine Boga M.D.   On: 11/24/2016 17:40    Microbiology: Recent Results (from the past 240 hour(s))  Urine culture     Status: None   Collection Time: 11/24/16  3:41 PM  Result Value Ref Range Status   Specimen Description URINE, CLEAN CATCH  Final   Special Requests NONE  Final  Culture   Final    NO GROWTH Performed at [facility], [place] [address] [place], [place] [address]    Report Status 11/25/2016 FINAL  Final  Blood culture (routine x 2)     Status: None (Preliminary result)   Collection Time: 11/24/16  4:52 PM  Result Value Ref Range Status   Specimen Description BLOOD RIGHT WRIST  Final   Special Requests IN PEDIATRIC BOTTLE Blood Culture adequate volume  Final   Culture   Final    NO GROWTH 2 DAYS Performed at [facility], [place] [address] [place], [place] [address]     Report Status PENDING  Incomplete  Blood culture (routine x 2)     Status: None (Preliminary result)   Collection Time: 11/24/16  5:30 PM  Result Value Ref Range Status   Specimen Description BLOOD BLOOD RIGHT HAND  Final   Special Requests   Final    BOTTLES DRAWN AEROBIC AND ANAEROBIC Blood Culture adequate volume   Culture   Final    NO GROWTH 2 DAYS Performed at [facility], [place] [address], [place], [place] [address]    Report Status PENDING  Incomplete     Labs: Basic Metabolic Panel: Recent Labs  Lab 11/24/16 1520 11/25/16 0222 11/26/16 0346  NA 133* 134* 136  K 4.1 4.0 4.1  CL 103 104 106  CO2 18* 22 24  GLUCOSE 98 129* 117*  BUN 38* 33* 27*  CREATININE 0.98 0.99 1.08*  CALCIUM 9.3 8.4* 8.5*   Liver Function Tests: Recent Labs  Lab 11/24/16 1520 11/25/16 0222  AST 20 17  ALT 17 15  ALKPHOS 104 80  BILITOT 1.0 0.5  PROT 7.1 6.0*  ALBUMIN 3.6 3.2*   Recent Labs  Lab 11/24/16 1541  LIPASE 22   No results for input(s): AMMONIA in the last 168 hours. CBC: Recent Labs  Lab 11/24/16 1520 11/25/16 0222 11/26/16 0346  WBC 74.2* 49.7* 13.0*  NEUTROABS 72.7* 48.7* 11.5*  HGB 11.9* 9.8* 9.5*  HCT 35.0* 28.9* 28.6*  MCV 87.5 88.1 87.7  PLT 472* 382 255   Cardiac Enzymes: Recent Labs  Lab 11/24/16 1520 11/24/16 2110 11/25/16 0222 11/25/16 0839  TROPONINI 0.06* <0.03 <0.03 <0.03   BNP: BNP (last 3 results) No results for input(s): BNP in the last 8760 hours.  ProBNP (last 3 results) No results for input(s): PROBNP in the last 8760 hours.  CBG: Recent Labs  Lab 11/24/16 1525  GLUCAP 113*       Signed:  Fayrene Helper MD.  [facility] 11/26/2016, 7:57 PM

## 2016-11-26 NOTE — Progress Notes (Signed)
PT recommendations gone over with pt. Pt politely declines home health services at this time. Marney Doctor RN,BSN,NCM [phone]

## 2016-11-27 ENCOUNTER — Ambulatory Visit
Admission: RE | Admit: 2016-11-27 | Discharge: 2016-11-27 | Disposition: A | Discharge status: Home / Self Care | Payer: Medicare Other | Source: Ambulatory Visit | Attending: Radiation Oncology | Admitting: Radiation Oncology

## 2016-11-27 DIAGNOSIS — Z510 Encounter for antineoplastic radiation therapy: Secondary | ICD-10-CM | POA: Diagnosis not present

## 2016-11-27 NOTE — Progress Notes (Signed)
   11/25/16 0942  PT Time Calculation  PT Start Time (ACUTE ONLY) 0921  PT Stop Time (ACUTE ONLY) 0936  PT Time Calculation (min) (ACUTE ONLY) 15 min  PT G-Codes **NOT FOR INPATIENT CLASS**  Functional Assessment Tool Used AM-PAC 6 Clicks Basic Mobility;Clinical judgement  Functional Limitation Mobility: Walking and moving around  Mobility: Walking and Moving Around Current Status (W2585) CL  Mobility: Walking and Moving Around Goal Status (I7782) CI  PT General Charges  $$ ACUTE PT VISIT 1 Visit  PT Evaluation  $PT Eval Low Complexity 1 Low  [facility] [phone]

## 2016-11-28 ENCOUNTER — Ambulatory Visit
Admission: RE | Admit: 2016-11-28 | Discharge: 2016-11-28 | Disposition: A | Discharge status: Home / Self Care | Payer: Medicare Other | Source: Ambulatory Visit | Attending: Radiation Oncology | Admitting: Radiation Oncology

## 2016-11-28 DIAGNOSIS — Z510 Encounter for antineoplastic radiation therapy: Secondary | ICD-10-CM | POA: Diagnosis not present

## 2016-11-29 ENCOUNTER — Ambulatory Visit
Admission: RE | Admit: 2016-11-29 | Discharge: 2016-11-29 | Disposition: A | Discharge status: Home / Self Care | Payer: Medicare Other | Source: Ambulatory Visit | Attending: Radiation Oncology | Admitting: Radiation Oncology

## 2016-11-29 DIAGNOSIS — Z510 Encounter for antineoplastic radiation therapy: Secondary | ICD-10-CM | POA: Diagnosis not present

## 2016-11-29 LAB — CULTURE, BLOOD (ROUTINE X 2)
CULTURE: NO GROWTH
CULTURE: NO GROWTH
SPECIAL REQUESTS: ADEQUATE
Special Requests: ADEQUATE

## 2016-12-02 ENCOUNTER — Other Ambulatory Visit (HOSPITAL_BASED_OUTPATIENT_CLINIC_OR_DEPARTMENT_OTHER): Payer: Medicare Other

## 2016-12-02 ENCOUNTER — Ambulatory Visit
Admission: RE | Admit: 2016-12-02 | Discharge: 2016-12-02 | Disposition: A | Discharge status: Home / Self Care | Payer: Medicare Other | Source: Ambulatory Visit | Attending: Radiation Oncology | Admitting: Radiation Oncology

## 2016-12-02 DIAGNOSIS — C3411 Malignant neoplasm of upper lobe, right bronchus or lung: Secondary | ICD-10-CM

## 2016-12-02 DIAGNOSIS — Z510 Encounter for antineoplastic radiation therapy: Secondary | ICD-10-CM | POA: Diagnosis not present

## 2016-12-02 LAB — CBC WITH DIFFERENTIAL/PLATELET
BASO%: 0.3 % (ref 0.0–2.0)
BASOS ABS: 0.1 10*3/uL (ref 0.0–0.1)
EOS ABS: 0.2 10*3/uL (ref 0.0–0.5)
EOS%: 0.7 % (ref 0.0–7.0)
HCT: 26.4 % — ABNORMAL LOW (ref 34.8–46.6)
HGB: 8.7 g/dL — ABNORMAL LOW (ref 11.6–15.9)
LYMPH%: 9 % — AB (ref 14.0–49.7)
MCH: 28.6 pg (ref 25.1–34.0)
MCHC: 32.9 g/dL (ref 31.5–36.0)
MCV: 86.9 fL (ref 79.5–101.0)
MONO#: 0.7 10*3/uL (ref 0.1–0.9)
MONO%: 2.8 % (ref 0.0–14.0)
NEUT%: 87.2 % — AB (ref 38.4–76.8)
NEUTROS ABS: 22.3 10*3/uL — AB (ref 1.5–6.5)
Platelets: 48 10*3/uL — ABNORMAL LOW (ref 145–400)
RBC: 3.04 10*6/uL — AB (ref 3.70–5.45)
RDW: 14 % (ref 11.2–14.5)
WBC: 25.6 10*3/uL — AB (ref 3.9–10.3)
lymph#: 2.3 10*3/uL (ref 0.9–3.3)

## 2016-12-02 LAB — COMPREHENSIVE METABOLIC PANEL
ALT: 12 U/L (ref 0–55)
AST: 17 U/L (ref 5–34)
Albumin: 3.3 g/dL — ABNORMAL LOW (ref 3.5–5.0)
Alkaline Phosphatase: 94 U/L (ref 40–150)
Anion Gap: 5 mEq/L (ref 3–11)
BUN: 19.2 mg/dL (ref 7.0–26.0)
CALCIUM: 8.9 mg/dL (ref 8.4–10.4)
CHLORIDE: 105 meq/L (ref 98–109)
CO2: 27 mEq/L (ref 22–29)
CREATININE: 1 mg/dL (ref 0.6–1.1)
EGFR: 55 mL/min/{1.73_m2} — ABNORMAL LOW (ref >60)
GLUCOSE: 99 mg/dL (ref 70–140)
Potassium: 4.5 mEq/L (ref 3.5–5.1)
Sodium: 137 mEq/L (ref 136–145)
Total Bilirubin: <0.22 mg/dL (ref 0.20–1.20)
Total Protein: 6.5 g/dL (ref 6.4–8.3)

## 2016-12-02 LAB — MAGNESIUM: Magnesium: 1.6 mg/dl (ref 1.5–2.5)

## 2016-12-03 ENCOUNTER — Ambulatory Visit
Admission: RE | Admit: 2016-12-03 | Discharge: 2016-12-03 | Disposition: A | Discharge status: Home / Self Care | Payer: Medicare Other | Source: Ambulatory Visit | Attending: Radiation Oncology | Admitting: Radiation Oncology

## 2016-12-03 DIAGNOSIS — Z510 Encounter for antineoplastic radiation therapy: Secondary | ICD-10-CM | POA: Diagnosis not present

## 2016-12-04 ENCOUNTER — Ambulatory Visit
Admission: RE | Admit: 2016-12-04 | Discharge: 2016-12-04 | Disposition: A | Discharge status: Home / Self Care | Payer: Medicare Other | Source: Ambulatory Visit | Attending: Radiation Oncology | Admitting: Radiation Oncology

## 2016-12-04 DIAGNOSIS — Z510 Encounter for antineoplastic radiation therapy: Secondary | ICD-10-CM | POA: Diagnosis not present

## 2016-12-09 ENCOUNTER — Ambulatory Visit
Admission: RE | Admit: 2016-12-09 | Discharge: 2016-12-09 | Disposition: A | Discharge status: Home / Self Care | Payer: Medicare Other | Source: Ambulatory Visit | Attending: Radiation Oncology | Admitting: Radiation Oncology

## 2016-12-09 ENCOUNTER — Other Ambulatory Visit: Payer: Self-pay | Admitting: Internal Medicine

## 2016-12-09 DIAGNOSIS — C3490 Malignant neoplasm of unspecified part of unspecified bronchus or lung: Secondary | ICD-10-CM

## 2016-12-09 DIAGNOSIS — Z7189 Other specified counseling: Secondary | ICD-10-CM

## 2016-12-09 DIAGNOSIS — Z510 Encounter for antineoplastic radiation therapy: Secondary | ICD-10-CM | POA: Diagnosis not present

## 2016-12-10 ENCOUNTER — Ambulatory Visit: Payer: Medicare Other

## 2016-12-10 ENCOUNTER — Ambulatory Visit (HOSPITAL_BASED_OUTPATIENT_CLINIC_OR_DEPARTMENT_OTHER): Payer: Medicare Other

## 2016-12-10 ENCOUNTER — Ambulatory Visit
Admission: RE | Admit: 2016-12-10 | Discharge: 2016-12-10 | Disposition: A | Discharge status: Home / Self Care | Payer: Medicare Other | Source: Ambulatory Visit | Attending: Radiation Oncology | Admitting: Radiation Oncology

## 2016-12-10 ENCOUNTER — Telehealth: Payer: Self-pay | Admitting: Internal Medicine

## 2016-12-10 ENCOUNTER — Ambulatory Visit (HOSPITAL_BASED_OUTPATIENT_CLINIC_OR_DEPARTMENT_OTHER): Payer: Medicare Other | Admitting: Internal Medicine

## 2016-12-10 ENCOUNTER — Encounter: Payer: Self-pay | Admitting: Internal Medicine

## 2016-12-10 VITALS — BP 113/48 | HR 100 | Temp 98.0°F | Resp 18 | Ht 65.0 in | Wt 142.2 lb

## 2016-12-10 DIAGNOSIS — G893 Neoplasm related pain (acute) (chronic): Secondary | ICD-10-CM

## 2016-12-10 DIAGNOSIS — C3411 Malignant neoplasm of upper lobe, right bronchus or lung: Secondary | ICD-10-CM | POA: Diagnosis not present

## 2016-12-10 DIAGNOSIS — Z5111 Encounter for antineoplastic chemotherapy: Secondary | ICD-10-CM

## 2016-12-10 DIAGNOSIS — R530 Neoplastic (malignant) related fatigue: Secondary | ICD-10-CM

## 2016-12-10 DIAGNOSIS — D6481 Anemia due to antineoplastic chemotherapy: Secondary | ICD-10-CM

## 2016-12-10 DIAGNOSIS — Z510 Encounter for antineoplastic radiation therapy: Secondary | ICD-10-CM | POA: Diagnosis not present

## 2016-12-10 LAB — COMPREHENSIVE METABOLIC PANEL
ALT: 10 U/L (ref 0–55)
ANION GAP: 8 meq/L (ref 3–11)
AST: 14 U/L (ref 5–34)
Albumin: 3.4 g/dL — ABNORMAL LOW (ref 3.5–5.0)
Alkaline Phosphatase: 84 U/L (ref 40–150)
BILIRUBIN TOTAL: 0.22 mg/dL (ref 0.20–1.20)
BUN: 28.9 mg/dL — ABNORMAL HIGH (ref 7.0–26.0)
CHLORIDE: 106 meq/L (ref 98–109)
CO2: 24 meq/L (ref 22–29)
CREATININE: 1 mg/dL (ref 0.6–1.1)
Calcium: 9.2 mg/dL (ref 8.4–10.4)
EGFR: 58 mL/min/{1.73_m2} — ABNORMAL LOW (ref >60)
GLUCOSE: 96 mg/dL (ref 70–140)
Potassium: 4.3 mEq/L (ref 3.5–5.1)
SODIUM: 139 meq/L (ref 136–145)
TOTAL PROTEIN: 7.2 g/dL (ref 6.4–8.3)

## 2016-12-10 LAB — CBC WITH DIFFERENTIAL/PLATELET
BASO%: 0.7 % (ref 0.0–2.0)
BASOS ABS: 0 10*3/uL (ref 0.0–0.1)
EOS ABS: 0.1 10*3/uL (ref 0.0–0.5)
EOS%: 1.5 % (ref 0.0–7.0)
HEMATOCRIT: 26.6 % — AB (ref 34.8–46.6)
HEMOGLOBIN: 8.9 g/dL — AB (ref 11.6–15.9)
LYMPH%: 10.4 % — ABNORMAL LOW (ref 14.0–49.7)
MCH: 29.5 pg (ref 25.1–34.0)
MCHC: 33.7 g/dL (ref 31.5–36.0)
MCV: 87.6 fL (ref 79.5–101.0)
MONO#: 1.1 10*3/uL — ABNORMAL HIGH (ref 0.1–0.9)
MONO%: 15.2 % — AB (ref 0.0–14.0)
NEUT#: 5 10*3/uL (ref 1.5–6.5)
NEUT%: 72.2 % (ref 38.4–76.8)
Platelets: 234 10*3/uL (ref 145–400)
RBC: 3.03 10*6/uL — ABNORMAL LOW (ref 3.70–5.45)
RDW: 14.9 % — AB (ref 11.2–14.5)
WBC: 6.9 10*3/uL (ref 3.9–10.3)
lymph#: 0.7 10*3/uL — ABNORMAL LOW (ref 0.9–3.3)

## 2016-12-10 LAB — MAGNESIUM: MAGNESIUM: 2.2 mg/dL (ref 1.5–2.5)

## 2016-12-10 MED ORDER — PALONOSETRON HCL INJECTION 0.25 MG/5ML
INTRAVENOUS | Status: AC
  Filled 2016-12-10: qty 5

## 2016-12-10 MED ORDER — SODIUM CHLORIDE 0.9 % IV SOLN
Freq: Once | INTRAVENOUS | Status: AC
  Administered 2016-12-10: 13:00:00 via INTRAVENOUS
  Filled 2016-12-10: qty 5

## 2016-12-10 MED ORDER — CISPLATIN CHEMO INJECTION 100MG/100ML
80.0000 mg/m2 | Freq: Once | INTRAVENOUS | Status: AC
  Administered 2016-12-10: 137 mg via INTRAVENOUS
  Filled 2016-12-10: qty 137

## 2016-12-10 MED ORDER — PALONOSETRON HCL INJECTION 0.25 MG/5ML
0.2500 mg | Freq: Once | INTRAVENOUS | Status: AC
  Administered 2016-12-10: 0.25 mg via INTRAVENOUS

## 2016-12-10 MED ORDER — POTASSIUM CHLORIDE 2 MEQ/ML IV SOLN
Freq: Once | INTRAVENOUS | Status: AC
  Administered 2016-12-10: 10:00:00 via INTRAVENOUS
  Filled 2016-12-10: qty 10

## 2016-12-10 MED ORDER — SODIUM CHLORIDE 0.9 % IV SOLN
Freq: Once | INTRAVENOUS | Status: AC
  Administered 2016-12-10: 10:00:00 via INTRAVENOUS

## 2016-12-10 MED ORDER — SODIUM CHLORIDE 0.9 % IV SOLN
100.0000 mg/m2 | Freq: Once | INTRAVENOUS | Status: AC
  Administered 2016-12-10: 170 mg via INTRAVENOUS
  Filled 2016-12-10: qty 8.5

## 2016-12-10 NOTE — Progress Notes (Signed)
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  PRIOR THERAPY: right upper lobectomy on 06/26/2016 and the final pathology was consistent with small cell lung cancer measuring 1.6 cm in size.  CURRENT THERAPY: systemic chemotherapy with cisplatin 60 MG/M2 on day 1 and etoposide 120 MG/M2 on days 1, 2 and 3 every 3 weeks.  Status post 2 cycles.  INTERVAL HISTORY: Robyn Barton 71 y.o. female returns to the clinic today for follow-up visit.  The patient is feeling fine today with no specific complaints except for fatigue.  She also has a lot of aching pain after the Neulasta injection.  She denied having any current fever or chills.  She has no nausea, vomiting, diarrhea or constipation.  The patient denied having any significant chest pain, shortness breath, cough or hemoptysis.  She has no recent weight loss or night sweats.  She is here today for evaluation before restarting cycle #3.  MEDICAL HISTORY: Past Medical History:  Diagnosis Date  . Allergic rhinitis   . Anxiety   . Chest pain   . Elevated TSH    pt denies  . Emphysema lung ([place])   . Enlarged lymph nodes    in chest  . GERD (gastroesophageal reflux disease)   . Goals of care, counseling/discussion 10/21/2016  . History of hiatal hernia   . Hyperlipidemia   . Hypoxemia   . IFG (impaired fasting glucose)   . Osteopenia   . Pneumonia    several  times last 2009  . Pulmonary nodule   . Recurrent major depressive disorder ([place])   . Sleep apnea    mild no cpap needed  . Small cell carcinoma of right lung ([place]) 2018   surgery right middle lobe removed   . Tobacco dependence   . Varicose veins of both lower extremities   . Vitamin D deficiency     ALLERGIES:   has No Known Allergies.  MEDICATIONS:  Current Outpatient Medications  Medication Sig Dispense Refill  . albuterol (PROVENTIL HFA;VENTOLIN HFA) 108 (90 Base) MCG/ACT inhaler Inhale 2 puffs into the lungs every 6 (six) hours as needed for wheezing or shortness of breath.    Marland Kitchen alendronate (FOSAMAX) 70 MG tablet Take 70 mg by mouth every Monday. Take with a full glass of water on an empty stomach.     Marland Kitchen atorvastatin (LIPITOR) 40 MG tablet Take 40 mg by mouth at bedtime.     . calcium carbonate (TUMS - DOSED IN MG ELEMENTAL CALCIUM) 500 MG chewable tablet Chew 1 tablet daily as needed by mouth for indigestion or heartburn.    . cephALEXin (KEFLEX) 500 MG capsule Take 1 capsule (500 mg total) by mouth 4 (four) times daily. (Patient not taking: Reported on 11/24/2016) 28 capsule 0  . Cholecalciferol (VITAMIN D) 2000 units tablet Take 2,000 Units by mouth daily.    . diphenhydramine-acetaminophen (TYLENOL PM) 25-500 MG TABS tablet Take 1 tablet at bedtime as needed by mouth (sleep).    Marland Kitchen HYDROcodone-acetaminophen (NORCO/VICODIN) 5-325 MG tablet Take 1 tablet by mouth every 4 (four) hours as needed. (Patient not taking: Reported on 11/24/2016) 10 tablet 0  . loperamide (IMODIUM) 2 MG capsule Take 2 mg as needed by mouth for diarrhea or  loose stools.    Marland Kitchen loratadine (CLARITIN) 10 MG tablet Take 10 mg by mouth daily as needed (hay fever).    . magnesium oxide (MAG-OX) 400 MG tablet Take 1 tablet by mouth 3 (three) times daily.  1  . omeprazole (PRILOSEC) 40 MG capsule Take 40 mg by mouth daily.   3  . prochlorperazine (COMPAZINE) 10 MG tablet TAKE 1 TABLET (10 MG TOTAL) BY MOUTH EVERY 6 (SIX) HOURS AS NEEDED FOR NAUSEA OR VOMITING. 30 tablet 0  . sertraline (ZOLOFT) 50 MG tablet Take 2.5 tablets (125 mg total) daily by mouth. 75 tablet 0  . tiotropium (SPIRIVA) 18 MCG inhalation capsule Place 18 mcg into inhaler and inhale daily.    . Wound Cleansers (RADIAPLEX EX) Apply topically.     No current  facility-administered medications for this visit.     SURGICAL HISTORY:  Past Surgical History:  Procedure Laterality Date  . ABDOMINAL HYSTERECTOMY     1 ovary left  . ENDOBRONCHIAL ULTRASOUND Bilateral 10/15/2016   Procedure: ENDOBRONCHIAL ULTRASOUND;  Surgeon: Collene Gobble, MD;  Location: [facility];  Service: Cardiopulmonary;  Laterality: Bilateral;  . INCONTINENCE SURGERY    . right middle lobe lung surgery  06/26/2016   [facility]    REVIEW OF SYSTEMS:  A comprehensive review of systems was negative except for: Constitutional: positive for fatigue Musculoskeletal: positive for arthralgias   PHYSICAL EXAMINATION: General appearance: alert, cooperative, fatigued and no distress Head: Normocephalic, without obvious abnormality, atraumatic Neck: no adenopathy, no JVD, supple, symmetrical, trachea midline and thyroid not enlarged, symmetric, no tenderness/mass/nodules Lymph nodes: Cervical, supraclavicular, and axillary nodes normal. Resp: clear to auscultation bilaterally Back: symmetric, no curvature. ROM normal. No CVA tenderness. Cardio: regular rate and rhythm, S1, S2 normal, no murmur, click, rub or gallop GI: soft, non-tender; bowel sounds normal; no masses,  no organomegaly Extremities: extremities normal, atraumatic, no cyanosis or edema  ECOG PERFORMANCE STATUS: 1 - Symptomatic but completely ambulatory  Blood pressure (!) 113/48, pulse 100, temperature 98 F (36.7 C), temperature source Oral, resp. rate 18, height 5\' 5"  (1.651 m), weight 142 lb 3.2 oz (64.5 kg), SpO2 99 %.  LABORATORY DATA: Lab Results  Component Value Date   WBC 6.9 12/10/2016   HGB 8.9 (L) 12/10/2016   HCT 26.6 (L) 12/10/2016   MCV 87.6 12/10/2016   PLT 234 12/10/2016      Chemistry      Component Value Date/Time   NA 137 12/02/2016 1021   K 4.5 12/02/2016 1021   CL 106 11/26/2016 0346   CO2 27 12/02/2016 1021   BUN 19.2 12/02/2016 1021   CREATININE 1.0 12/02/2016 1021        Component Value Date/Time   CALCIUM 8.9 12/02/2016 1021   ALKPHOS 94 12/02/2016 1021   AST 17 12/02/2016 1021   ALT 12 12/02/2016 1021   BILITOT <0.22 12/02/2016 1021       RADIOGRAPHIC STUDIES: Dg Chest 2 View  Result Date: 11/24/2016 CLINICAL DATA:  Pt states she has nausea and weakness today. Pt states she has been feeling like she need to vomit today. Pt has n other chest complaints at this time. PT HX: Lung CA- chemo 4 weeks ago, pneumonia, ex smoker 06/2016, right middle lobe lung surgery 06/2016 EXAM: CHEST  2 VIEW COMPARISON:  Chest CT 12/11/2016 FINDINGS: Normal cardiac silhouette. There is volume loss in the RIGHT hemithorax. Surgical staple line noted in the RIGHT lower lobe. No effusion, infiltrate pneumothorax. IMPRESSION: No acute cardiopulmonary  process. Postoperative change RIGHT hemithorax are Electronically Signed   By: Suzy Bouchard M.D.   On: 11/24/2016 16:08   Ct Soft Tissue Neck W Contrast  Result Date: 11/24/2016 CLINICAL DATA:  Initial evaluation for generalized pain, neck pain. History of lung cancer. Sensation of lump in throat. EXAM: CT NECK WITH CONTRAST TECHNIQUE: Multidetector CT imaging of the neck was performed using the standard protocol following the bolus administration of intravenous contrast. CONTRAST:  100 cc of Isovue-300. COMPARISON:  None available. FINDINGS: Pharynx and larynx: Oral cavity within normal limits without mass lesion or loculated fluid collection. No definite acute abnormality about the dentition, although evaluation is limited due to extensive streak artifact from dental amalgam. Palatine tonsils grossly symmetric and within normal limits. Parapharyngeal fat preserved. Nasopharynx normal. Retropharyngeal soft tissues within normal limits. Epiglottis normal. Vallecula clear. Remainder of the hypopharynx and supraglottic larynx without acute abnormality. Left piriform sinus is effaced without appreciable discrete mass or other abnormality.  True cords fairly symmetric and within normal limits. Subglottic airway clear. Salivary glands: Salivary glands including the parotid and submandibular glands within normal limits. Thyroid: Subcentimeter hypodense nodule noted within the right thyroid lobe, of doubtful significance. Thyroid otherwise unremarkable. Lymph nodes: No adenopathy within the neck. Vascular: Normal intravascular enhancement seen throughout the neck. Vascular calcifications present about the carotid bifurcations. Limited intracranial: Unremarkable. Visualized orbits: Visualized globes and orbital soft tissues within normal limits. Mastoids and visualized paranasal sinuses: Small retention cyst noted within the right maxillary sinus. Visualized paranasal sinuses otherwise clear. Visualize mastoids clear. Skeleton: No acute osseus abnormality. No worrisome lytic or blastic osseous lesions. Upper chest: Partially visualized upper chest demonstrates no acute abnormality. Centrilobular and paraseptal emphysema noted. Biapical pleuroparenchymal thickening. Other: None. IMPRESSION: 1. No acute inflammatory process or other abnormality identified within the neck. No mass lesion or adenopathy. 2. Emphysema. Electronically Signed   By: Jeannine Boga M.D.   On: 11/24/2016 17:30   Ct Angio Chest W/cm &/or Wo Cm  Result Date: 11/15/2016 CLINICAL DATA:  Right small cell lung cancer. Pulmonary embolus suspected. Shortness of breath. EXAM: CT ANGIOGRAPHY CHEST WITH CONTRAST TECHNIQUE: Multidetector CT imaging of the chest was performed using the standard protocol during bolus administration of intravenous contrast. Multiplanar CT image reconstructions and MIPs were obtained to evaluate the vascular anatomy. CONTRAST:  100 cc Isovue 370 IV COMPARISON:  PET CT 09/26/2016.  Chest CT 09/14/2016. FINDINGS: Cardiovascular: No filling defects in the pulmonary artery is to suggest pulmonary emboli. Eft cirrhotic calcifications in the ascending thoracic  aorta and aortic arch. No aneurysm. Heart is upper limits normal in size. Mediastinum/Nodes: Subcarinal adenopathy has a short axis diameter of 11 mm compared with 19 mm previously. Right paratracheal lymph node has a short axis diameter of 8 mm compared 8 mm previously. No visible hilar or axillary adenopathy. Lungs/Pleura: Status post right middle lobectomy. Biapical scarring. No confluent opacities or suspicious pulmonary nodules. No pleural effusions. Upper Abdomen: Imaging into the upper abdomen shows no acute findings. Musculoskeletal: Chest wall soft tissues are unremarkable. No acute bony abnormality. Review of the MIP images confirms the above findings. IMPRESSION: No evidence of pulmonary embolus. Postoperative changes on the right. Decreasing prominence of subcarinal lymph nodes. Otherwise stable small scattered mediastinal lymph nodes. No acute cardiopulmonary process. Electronically Signed   By: Rolm Baptise M.D.   On: 11/15/2016 11:35   Ct Abdomen Pelvis W Contrast  Result Date: 11/24/2016 CLINICAL DATA:  71 y/o F; generalized weakness and pain. History of lung  cancer and chemotherapy starting 4 weeks ago. EXAM: CT ABDOMEN AND PELVIS WITH CONTRAST TECHNIQUE: Multidetector CT imaging of the abdomen and pelvis was performed using the standard protocol following bolus administration of intravenous contrast. CONTRAST:  100 cc Isovue-300 COMPARISON:  11/09/2016 PET-CT. FINDINGS: Lower chest: Postsurgical changes in the right anterior lung base, partially visualized. Small hiatal hernia. Hepatobiliary: No focal liver abnormality is seen. No gallstones, gallbladder wall thickening, or biliary dilatation. Pancreas: Unremarkable. No pancreatic ductal dilatation or surrounding inflammatory changes. Spleen: Normal in size without focal abnormality. Adrenals/Urinary Tract: Normal adrenal glands. Bilateral renal cysts measuring up to 13 mm in right kidney lower pole. No hydronephrosis. Normal bladder.  Stomach/Bowel: Stomach is within normal limits. Appendix not identified, no pericecal inflammation. No evidence of bowel wall thickening, distention, or inflammatory changes. Vascular/Lymphatic: Aortic atherosclerosis. No enlarged abdominal or pelvic lymph nodes. Reproductive: Status post hysterectomy. No adnexal masses. Other: No abdominal wall hernia or abnormality. No abdominopelvic ascites. Musculoskeletal: Moderate multilevel degenerative changes of the spine greatest at L1-L3 where there is moderate to severe disc space narrowing. No acute fracture. IMPRESSION: 1. No acute process identified. 2. Small hiatal hernia. 3. Aortic atherosclerosis. Electronically Signed   By: Kristine Garbe M.D.   On: 11/24/2016 17:03   Ct L-spine No Charge  Result Date: 11/24/2016 CLINICAL DATA:  Initial evaluation for back pain, history of lung cancer. EXAM: CT LUMBAR SPINE WITHOUT CONTRAST TECHNIQUE: Multidetector CT imaging of the lumbar spine was performed without intravenous contrast administration. Multiplanar CT image reconstructions were also generated. COMPARISON:  Prior PET-CT from 09/26/2016. FINDINGS: Segmentation: Normal segmentation. Lowest well-formed disc labeled the L5-S1 level. Alignment: Trace retrolisthesis of L2 on L3 and L3 on L4. Mild dextroscoliosis with apex at L2-3. Exaggeration the normal lumbar lordosis. Vertebrae: Vertebral body heights maintained. No evidence for acute or chronic fracture. No discrete lytic or blastic osseous lesions. Partially visualize sacrum and bony pelvis intact. Paraspinal and other soft tissues: Paraspinous soft tissues demonstrate no acute abnormality. Moderate atherosclerosis. Small simple cyst noted within the inferior pole right kidney. Visualized visceral structures otherwise unremarkable. Disc levels: L1-2: Chronic intervertebral disc space narrowing with disc desiccation and mild disc bulge. Mild lateral facet hypertrophy. No canal or foraminal stenosis.  L2-3: Trace retrolisthesis. Chronic intervertebral disc space narrowing with disc desiccation. Prominent left-sided reactive endplate changes due to scoliotic curvature. Moderate facet hypertrophy. No significant canal stenosis. Mild bilateral L2 foraminal narrowing. L3-4: Trace retrolisthesis. Disc desiccation. Moderate facet hypertrophy. No significant spinal stenosis. Mild to moderate bilateral L3 foraminal narrowing. L4-5: Diffuse disc bulge with mild intervertebral disc space narrowing. Moderate facet and ligamentum flavum hypertrophy. Mild canal with a least moderate bilateral subarticular stenosis. Moderate bilateral L4 foraminal stenosis. L5-S1: Mild diffuse disc bulge. Advanced bilateral facet arthrosis. No significant canal stenosis. Moderate bilateral L5 foraminal narrowing, right worse than left. IMPRESSION: 1. No acute abnormality within the lumbar spine. 2. Dextroscoliosis with moderate multilevel spondylolysis, most pronounced at L2-3 on the left. 3. Multifactorial degenerative changes with resultant moderate bilateral foraminal narrowing at L3-4 through L5-S1. 4. Moderate to advanced bilateral facet arthrosis at L4-5 and L5-S1. Finding could serve as a source for low back pain. Electronically Signed   By: Jeannine Boga M.D.   On: 11/24/2016 17:40    ASSESSMENT AND PLAN: This is a very pleasant 71 years old white female with history of limited stage small cell lung cancer status post left upper lobectomy in June 2018.  The patient had evidence for disease recurrence with hypermetabolic subcarinal  lymphadenopathy that was biopsy-proven to be small cell carcinoma. She is currently undergoing systemic chemotherapy with cisplatin and etoposide.  She is status post 2 cycles.  She has been tolerating this treatment well except for fatigue and aching pain after the Neulasta injection. I recommended for the patient to proceed with cycle #3 today. For the aching pain after the Neulasta injection,  I advised the patient to take Claritin 10 mg p.o. daily for few days with the injection. Her fatigue is likely secondary to chemotherapy-induced anemia.  We will continue to monitor closely and consider the patient for PRBCs transfusion if her hemoglobin is less than 8.0 G/DL. The patient will come back for follow-up visit in 3 weeks for evaluation before starting cycle #4. She was advised to call immediately if she has any concerning symptoms in the interval. The patient voices understanding of current disease status and treatment options and is in agreement with the current care plan.  All questions were answered. The patient knows to call the clinic with any problems, questions or concerns. We can certainly see the patient much sooner if necessary.  Disclaimer: This note was dictated with voice recognition software. Similar sounding words can inadvertently be transcribed and may not be corrected upon review.

## 2016-12-10 NOTE — Progress Notes (Signed)
Pt received pre fluids for Cisplatin regimen, only able to void 220 mLs. Per Dr. [name], "pt's creatinine good, it's ok to treat". Will continue to monitor

## 2016-12-10 NOTE — Patient Instructions (Signed)
[facility] Discharge Instructions for Patients Receiving Chemotherapy  Today you received the following chemotherapy agents: Cisplatin (Platinol) and Etoposide (Vepesid).  To help prevent nausea and vomiting after your treatment, we encourage you to take your nausea medication as prescribed.  If you develop nausea and vomiting that is not controlled by your nausea medication, call the clinic.   BELOW ARE SYMPTOMS THAT SHOULD BE REPORTED IMMEDIATELY:  *FEVER GREATER THAN 100.5 F  *CHILLS WITH OR WITHOUT FEVER  NAUSEA AND VOMITING THAT IS NOT CONTROLLED WITH YOUR NAUSEA MEDICATION  *UNUSUAL SHORTNESS OF BREATH  *UNUSUAL BRUISING OR BLEEDING  TENDERNESS IN MOUTH AND THROAT WITH OR WITHOUT PRESENCE OF ULCERS  *URINARY PROBLEMS  *BOWEL PROBLEMS  UNUSUAL RASH Items with * indicate a potential emergency and should be followed up as soon as possible.  Feel free to call the clinic should you have any questions or concerns. The clinic phone number is [phone].  Please show the [place] at check-in to the Emergency Department and triage nurse.

## 2016-12-10 NOTE — Progress Notes (Signed)
Nutrition Assessment   Reason for Assessment:   Patient with recent hospital admission, nausea, vomiting, fatigue, decreased intake  ASSESSMENT:  71 year old female with lung cancer. Patient currently receiving chemotherapy.  Past medical history of emphysema of lung, GERD, HLD, depression, Vit D deficiency.  Noted recent hospital admission on 11/11  Met with patient during infusion today.  No family members in infusion during visit.  Patient reports appetite has been "too good."  Peanut butter and graham crackers at chairside.  Patient reports that she had some issues with nausea and vomiting previously that effected her appetite. Also reports when she received the neulasta shot she does not eat well.  Reports that she is taking nausea medication as prescribed.  Reports that she is drinking ensure plus with milk to dilute the sweetness.  Unable to tell me what a typical day's intake is during the day.  Reports she likes all foods.    Nutrition Focused Physical Exam: deferred  Medications: Vit D, compazine, Mag ox  Labs: reviewed  Anthropometrics:   Height: 65 inches Weight: 142 lb 3.2 oz UBW: 140s BMI: 23   Estimated Energy Needs  Kcals: 1900-2200 calories/d Protein: 77-96 g/d Fluid: 2.2 L/d  NUTRITION DIAGNOSIS: Inadequate oral intake related to cancer related side effects as evidenced by nausea, vomiting, poor appetite, hospital admission   INTERVENTION:   Discussed strategies to help with nausea and vomiting. Fact sheet given Encouraged patient to continue to drink ensure plus for added calories. Coupons given Contact information provided today as well    MONITORING, EVALUATION, GOAL: Patient will consume adequate calories and protein to maintain weight during treatment   NEXT VISIT: 12/18 during infusion  Hardie Veltre B. Zenia Resides, [place], [place] Registered Dietitian [phone] (pager)

## 2016-12-10 NOTE — Telephone Encounter (Signed)
Gave patient avs report and appointments for November and December

## 2016-12-11 ENCOUNTER — Encounter: Payer: Self-pay | Admitting: General Practice

## 2016-12-11 ENCOUNTER — Ambulatory Visit
Admission: RE | Admit: 2016-12-11 | Discharge: 2016-12-11 | Disposition: A | Discharge status: Home / Self Care | Payer: Medicare Other | Source: Ambulatory Visit | Attending: Radiation Oncology | Admitting: Radiation Oncology

## 2016-12-11 ENCOUNTER — Ambulatory Visit (HOSPITAL_BASED_OUTPATIENT_CLINIC_OR_DEPARTMENT_OTHER): Payer: Medicare Other

## 2016-12-11 VITALS — BP 152/81 | HR 87 | Temp 98.0°F | Resp 18

## 2016-12-11 DIAGNOSIS — Z5111 Encounter for antineoplastic chemotherapy: Secondary | ICD-10-CM

## 2016-12-11 DIAGNOSIS — Z510 Encounter for antineoplastic radiation therapy: Secondary | ICD-10-CM | POA: Diagnosis not present

## 2016-12-11 DIAGNOSIS — C3411 Malignant neoplasm of upper lobe, right bronchus or lung: Secondary | ICD-10-CM | POA: Diagnosis not present

## 2016-12-11 MED ORDER — SODIUM CHLORIDE 0.9 % IV SOLN
Freq: Once | INTRAVENOUS | Status: AC
  Administered 2016-12-11: 10:00:00 via INTRAVENOUS

## 2016-12-11 MED ORDER — DEXAMETHASONE SODIUM PHOSPHATE 10 MG/ML IJ SOLN
10.0000 mg | Freq: Once | INTRAMUSCULAR | Status: AC
  Administered 2016-12-11: 10 mg via INTRAVENOUS

## 2016-12-11 MED ORDER — SODIUM CHLORIDE 0.9 % IV SOLN
100.0000 mg/m2 | Freq: Once | INTRAVENOUS | Status: AC
  Administered 2016-12-11: 170 mg via INTRAVENOUS
  Filled 2016-12-11: qty 8.5

## 2016-12-11 MED ORDER — DEXAMETHASONE SODIUM PHOSPHATE 10 MG/ML IJ SOLN
INTRAMUSCULAR | Status: AC
  Filled 2016-12-11: qty 1

## 2016-12-11 NOTE — Progress Notes (Signed)
[facility] CSW Progress Note  Received staff message indicating patient may benefit from [facility] transportation help for upcoming appointments; CSW called patient and left VM requesting call back to discuss referral process for this service.  Awaiting call back.  Edwyna Shell, LCSW Clinical Social Worker Phone:  [phone]

## 2016-12-11 NOTE — Patient Instructions (Signed)
[facility] Discharge Instructions for Patients Receiving Chemotherapy  Today you received the following chemotherapy agents: Etoposide   To help prevent nausea and vomiting after your treatment, we encourage you to take your nausea medication as directed.    If you develop nausea and vomiting that is not controlled by your nausea medication, call the clinic.   BELOW ARE SYMPTOMS THAT SHOULD BE REPORTED IMMEDIATELY:  *FEVER GREATER THAN 100.5 F  *CHILLS WITH OR WITHOUT FEVER  NAUSEA AND VOMITING THAT IS NOT CONTROLLED WITH YOUR NAUSEA MEDICATION  *UNUSUAL SHORTNESS OF BREATH  *UNUSUAL BRUISING OR BLEEDING  TENDERNESS IN MOUTH AND THROAT WITH OR WITHOUT PRESENCE OF ULCERS  *URINARY PROBLEMS  *BOWEL PROBLEMS  UNUSUAL RASH Items with * indicate a potential emergency and should be followed up as soon as possible.  Feel free to call the clinic should you have any questions or concerns. The clinic phone number is [phone].  Please show the [facility] at check-in to the Emergency Department and triage nurse.

## 2016-12-12 ENCOUNTER — Ambulatory Visit (HOSPITAL_BASED_OUTPATIENT_CLINIC_OR_DEPARTMENT_OTHER): Payer: Medicare Other

## 2016-12-12 ENCOUNTER — Encounter: Payer: Self-pay | Admitting: General Practice

## 2016-12-12 ENCOUNTER — Ambulatory Visit
Admission: RE | Admit: 2016-12-12 | Discharge: 2016-12-12 | Disposition: A | Discharge status: Home / Self Care | Payer: Medicare Other | Source: Ambulatory Visit | Attending: Radiation Oncology | Admitting: Radiation Oncology

## 2016-12-12 VITALS — BP 151/75 | HR 85 | Temp 97.9°F | Resp 17

## 2016-12-12 DIAGNOSIS — Z5111 Encounter for antineoplastic chemotherapy: Secondary | ICD-10-CM | POA: Diagnosis not present

## 2016-12-12 DIAGNOSIS — Z5189 Encounter for other specified aftercare: Secondary | ICD-10-CM | POA: Diagnosis not present

## 2016-12-12 DIAGNOSIS — C3411 Malignant neoplasm of upper lobe, right bronchus or lung: Secondary | ICD-10-CM | POA: Diagnosis not present

## 2016-12-12 DIAGNOSIS — Z510 Encounter for antineoplastic radiation therapy: Secondary | ICD-10-CM | POA: Diagnosis not present

## 2016-12-12 MED ORDER — DEXAMETHASONE SODIUM PHOSPHATE 10 MG/ML IJ SOLN
10.0000 mg | Freq: Once | INTRAMUSCULAR | Status: AC
  Administered 2016-12-12: 10 mg via INTRAVENOUS

## 2016-12-12 MED ORDER — SODIUM CHLORIDE 0.9 % IV SOLN
Freq: Once | INTRAVENOUS | Status: AC
  Administered 2016-12-12: 15:00:00 via INTRAVENOUS

## 2016-12-12 MED ORDER — DEXAMETHASONE SODIUM PHOSPHATE 10 MG/ML IJ SOLN
INTRAMUSCULAR | Status: AC
  Filled 2016-12-12: qty 1

## 2016-12-12 MED ORDER — SODIUM CHLORIDE 0.9 % IV SOLN
100.0000 mg/m2 | Freq: Once | INTRAVENOUS | Status: AC
  Administered 2016-12-12: 170 mg via INTRAVENOUS
  Filled 2016-12-12: qty 8.5

## 2016-12-12 MED ORDER — PEGFILGRASTIM 6 MG/0.6ML ~~LOC~~ PSKT
6.0000 mg | PREFILLED_SYRINGE | Freq: Once | SUBCUTANEOUS | Status: AC
  Administered 2016-12-12: 6 mg via SUBCUTANEOUS
  Filled 2016-12-12: qty 0.6

## 2016-12-12 NOTE — Patient Instructions (Signed)
[facility] Discharge Instructions for Patients Receiving Chemotherapy  Today you received the following chemotherapy agents:  Taxol (paclitaxel), Carboplatin (paraplatin)  To help prevent nausea and vomiting after your treatment, we encourage you to take your nausea medication as prescribed.   If you develop nausea and vomiting that is not controlled by your nausea medication, call the clinic.   BELOW ARE SYMPTOMS THAT SHOULD BE REPORTED IMMEDIATELY:  *FEVER GREATER THAN 100.5 F  *CHILLS WITH OR WITHOUT FEVER  NAUSEA AND VOMITING THAT IS NOT CONTROLLED WITH YOUR NAUSEA MEDICATION  *UNUSUAL SHORTNESS OF BREATH  *UNUSUAL BRUISING OR BLEEDING  TENDERNESS IN MOUTH AND THROAT WITH OR WITHOUT PRESENCE OF ULCERS  *URINARY PROBLEMS  *BOWEL PROBLEMS  UNUSUAL RASH Items with * indicate a potential emergency and should be followed up as soon as possible.  Feel free to call the clinic should you have any questions or concerns. The clinic phone number is [phone].  Please show the [facility] at check-in to the Emergency Department and triage nurse.    Carboplatin injection What is this medicine? CARBOPLATIN (KAR boe pla tin) is a chemotherapy drug. It targets fast dividing cells, like cancer cells, and causes these cells to die. This medicine is used to treat ovarian cancer and many other cancers. This medicine may be used for other purposes; ask your health care provider or pharmacist if you have questions. COMMON BRAND NAME(S): Paraplatin What should I tell my health care provider before I take this medicine? They need to know if you have any of these conditions: -blood disorders -hearing problems -kidney disease -recent or ongoing radiation therapy -an unusual or allergic reaction to carboplatin, cisplatin, other chemotherapy, other medicines, foods, dyes, or preservatives -pregnant or trying to get pregnant -breast-feeding How should I use this  medicine? This drug is usually given as an infusion into a vein. It is administered in a hospital or clinic by a specially trained health care professional. Talk to your pediatrician regarding the use of this medicine in children. Special care may be needed. Overdosage: If you think you have taken too much of this medicine contact a poison control center or emergency room at once. NOTE: This medicine is only for you. Do not share this medicine with others. What if I miss a dose? It is important not to miss a dose. Call your doctor or health care professional if you are unable to keep an appointment. What may interact with this medicine? -medicines for seizures -medicines to increase blood counts like filgrastim, pegfilgrastim, sargramostim -some antibiotics like amikacin, gentamicin, neomycin, streptomycin, tobramycin -vaccines Talk to your doctor or health care professional before taking any of these medicines: -acetaminophen -aspirin -ibuprofen -ketoprofen -naproxen This list may not describe all possible interactions. Give your health care provider a list of all the medicines, herbs, non-prescription drugs, or dietary supplements you use. Also tell them if you smoke, drink alcohol, or use illegal drugs. Some items may interact with your medicine. What should I watch for while using this medicine? Your condition will be monitored carefully while you are receiving this medicine. You will need important blood work done while you are taking this medicine. This drug may make you feel generally unwell. This is not uncommon, as chemotherapy can affect healthy cells as well as cancer cells. Report any side effects. Continue your course of treatment even though you feel ill unless your doctor tells you to stop. In some cases, you may be given additional medicines to  help with side effects. Follow all directions for their use. Call your doctor or health care professional for advice if you get a  fever, chills or sore throat, or other symptoms of a cold or flu. Do not treat yourself. This drug decreases your body's ability to fight infections. Try to avoid being around people who are sick. This medicine may increase your risk to bruise or bleed. Call your doctor or health care professional if you notice any unusual bleeding. Be careful brushing and flossing your teeth or using a toothpick because you may get an infection or bleed more easily. If you have any dental work done, tell your dentist you are receiving this medicine. Avoid taking products that contain aspirin, acetaminophen, ibuprofen, naproxen, or ketoprofen unless instructed by your doctor. These medicines may hide a fever. Do not become pregnant while taking this medicine. Women should inform their doctor if they wish to become pregnant or think they might be pregnant. There is a potential for serious side effects to an unborn child. Talk to your health care professional or pharmacist for more information. Do not breast-feed an infant while taking this medicine. What side effects may I notice from receiving this medicine? Side effects that you should report to your doctor or health care professional as soon as possible: -allergic reactions like skin rash, itching or hives, swelling of the face, lips, or tongue -signs of infection - fever or chills, cough, sore throat, pain or difficulty passing urine -signs of decreased platelets or bleeding - bruising, pinpoint red spots on the skin, black, tarry stools, nosebleeds -signs of decreased red blood cells - unusually weak or tired, fainting spells, lightheadedness -breathing problems -changes in hearing -changes in vision -chest pain -high blood pressure -low blood counts - This drug may decrease the number of white blood cells, red blood cells and platelets. You may be at increased risk for infections and bleeding. -nausea and vomiting -pain, swelling, redness or irritation at the  injection site -pain, tingling, numbness in the hands or feet -problems with balance, talking, walking -trouble passing urine or change in the amount of urine Side effects that usually do not require medical attention (report to your doctor or health care professional if they continue or are bothersome): -hair loss -loss of appetite -metallic taste in the mouth or changes in taste This list may not describe all possible side effects. Call your doctor for medical advice about side effects. You may report side effects to FDA at 1-800-FDA-1088. Where should I keep my medicine? This drug is given in a hospital or clinic and will not be stored at home. NOTE: This sheet is a summary. It may not cover all possible information. If you have questions about this medicine, talk to your doctor, pharmacist, or health care provider.  2018 Elsevier/Gold Standard (2007-04-07 14:38:05)   Paclitaxel injection What is this medicine? PACLITAXEL (PAK li TAX el) is a chemotherapy drug. It targets fast dividing cells, like cancer cells, and causes these cells to die. This medicine is used to treat ovarian cancer, breast cancer, and other cancers. This medicine may be used for other purposes; ask your health care provider or pharmacist if you have questions. COMMON BRAND NAME(S): Onxol, Taxol What should I tell my health care provider before I take this medicine? They need to know if you have any of these conditions: -blood disorders -irregular heartbeat -infection (especially a virus infection such as chickenpox, cold sores, or herpes) -liver disease -previous or ongoing radiation therapy -  an unusual or allergic reaction to paclitaxel, alcohol, polyoxyethylated castor oil, other chemotherapy agents, other medicines, foods, dyes, or preservatives -pregnant or trying to get pregnant -breast-feeding How should I use this medicine? This drug is given as an infusion into a vein. It is administered in a hospital  or clinic by a specially trained health care professional. Talk to your pediatrician regarding the use of this medicine in children. Special care may be needed. Overdosage: If you think you have taken too much of this medicine contact a poison control center or emergency room at once. NOTE: This medicine is only for you. Do not share this medicine with others. What if I miss a dose? It is important not to miss your dose. Call your doctor or health care professional if you are unable to keep an appointment. What may interact with this medicine? Do not take this medicine with any of the following medications: -disulfiram -metronidazole This medicine may also interact with the following medications: -cyclosporine -diazepam -ketoconazole -medicines to increase blood counts like filgrastim, pegfilgrastim, sargramostim -other chemotherapy drugs like cisplatin, doxorubicin, epirubicin, etoposide, teniposide, vincristine -quinidine -testosterone -vaccines -verapamil Talk to your doctor or health care professional before taking any of these medicines: -acetaminophen -aspirin -ibuprofen -ketoprofen -naproxen This list may not describe all possible interactions. Give your health care provider a list of all the medicines, herbs, non-prescription drugs, or dietary supplements you use. Also tell them if you smoke, drink alcohol, or use illegal drugs. Some items may interact with your medicine. What should I watch for while using this medicine? Your condition will be monitored carefully while you are receiving this medicine. You will need important blood work done while you are taking this medicine. This medicine can cause serious allergic reactions. To reduce your risk you will need to take other medicine(s) before treatment with this medicine. If you experience allergic reactions like skin rash, itching or hives, swelling of the face, lips, or tongue, tell your doctor or health care professional  right away. In some cases, you may be given additional medicines to help with side effects. Follow all directions for their use. This drug may make you feel generally unwell. This is not uncommon, as chemotherapy can affect healthy cells as well as cancer cells. Report any side effects. Continue your course of treatment even though you feel ill unless your doctor tells you to stop. Call your doctor or health care professional for advice if you get a fever, chills or sore throat, or other symptoms of a cold or flu. Do not treat yourself. This drug decreases your body's ability to fight infections. Try to avoid being around people who are sick. This medicine may increase your risk to bruise or bleed. Call your doctor or health care professional if you notice any unusual bleeding. Be careful brushing and flossing your teeth or using a toothpick because you may get an infection or bleed more easily. If you have any dental work done, tell your dentist you are receiving this medicine. Avoid taking products that contain aspirin, acetaminophen, ibuprofen, naproxen, or ketoprofen unless instructed by your doctor. These medicines may hide a fever. Do not become pregnant while taking this medicine. Women should inform their doctor if they wish to become pregnant or think they might be pregnant. There is a potential for serious side effects to an unborn child. Talk to your health care professional or pharmacist for more information. Do not breast-feed an infant while taking this medicine. Men are advised not to  father a child while receiving this medicine. This product may contain alcohol. Ask your pharmacist or healthcare provider if this medicine contains alcohol. Be sure to tell all healthcare providers you are taking this medicine. Certain medicines, like metronidazole and disulfiram, can cause an unpleasant reaction when taken with alcohol. The reaction includes flushing, headache, nausea, vomiting, sweating, and  increased thirst. The reaction can last from 30 minutes to several hours. What side effects may I notice from receiving this medicine? Side effects that you should report to your doctor or health care professional as soon as possible: -allergic reactions like skin rash, itching or hives, swelling of the face, lips, or tongue -low blood counts - This drug may decrease the number of white blood cells, red blood cells and platelets. You may be at increased risk for infections and bleeding. -signs of infection - fever or chills, cough, sore throat, pain or difficulty passing urine -signs of decreased platelets or bleeding - bruising, pinpoint red spots on the skin, black, tarry stools, nosebleeds -signs of decreased red blood cells - unusually weak or tired, fainting spells, lightheadedness -breathing problems -chest pain -high or low blood pressure -mouth sores -nausea and vomiting -pain, swelling, redness or irritation at the injection site -pain, tingling, numbness in the hands or feet -slow or irregular heartbeat -swelling of the ankle, feet, hands Side effects that usually do not require medical attention (report to your doctor or health care professional if they continue or are bothersome): -bone pain -complete hair loss including hair on your head, underarms, pubic hair, eyebrows, and eyelashes -changes in the color of fingernails -diarrhea -loosening of the fingernails -loss of appetite -muscle or joint pain -red flush to skin -sweating This list may not describe all possible side effects. Call your doctor for medical advice about side effects. You may report side effects to FDA at 1-800-FDA-1088. Where should I keep my medicine? This drug is given in a hospital or clinic and will not be stored at home. NOTE: This sheet is a summary. It may not cover all possible information. If you have questions about this medicine, talk to your doctor, pharmacist, or health care provider.  2018  Elsevier/Gold Standard (2014-11-01 19:58:00)

## 2016-12-12 NOTE — Progress Notes (Signed)
[facility] CSW Progress Note  Second call to patient to gain consent for referral to [facility], transportation assistance for getting to/from treatment.  VM left both times requesting call back, awaiting response from patient.  Edwyna Shell, LCSW Clinical Social Worker Phone:  [phone]

## 2016-12-13 ENCOUNTER — Ambulatory Visit
Admission: RE | Admit: 2016-12-13 | Discharge: 2016-12-13 | Disposition: A | Discharge status: Home / Self Care | Payer: Medicare Other | Source: Ambulatory Visit | Attending: Radiation Oncology | Admitting: Radiation Oncology

## 2016-12-13 ENCOUNTER — Telehealth: Payer: Self-pay

## 2016-12-13 ENCOUNTER — Other Ambulatory Visit: Payer: Self-pay | Admitting: Radiation Oncology

## 2016-12-13 DIAGNOSIS — Z510 Encounter for antineoplastic radiation therapy: Secondary | ICD-10-CM | POA: Diagnosis not present

## 2016-12-13 MED ORDER — SUCRALFATE 1 G PO TABS: 1.0000 g | ORAL_TABLET | Freq: Three times a day (TID) | ORAL | 30 refills | Status: DC

## 2016-12-13 MED ORDER — DEXAMETHASONE 4 MG PO TABS: 4.0000 mg | ORAL_TABLET | Freq: Every day | ORAL | 0 refills | Status: DC

## 2016-12-13 MED ORDER — ONDANSETRON HCL 8 MG PO TABS: 8.0000 mg | ORAL_TABLET | Freq: Three times a day (TID) | ORAL | 0 refills | Status: DC | PRN

## 2016-12-13 NOTE — Telephone Encounter (Signed)
Daughter called that last round of chemo she ended up in hospital too weak to do anything but sleep. Today she took her claritin at 1130 but threw up at 1315. Should she take another claritin.    Daughter is worried she will have a bad experience again. After cycle 2 of chemo pt was crawling to toilet and vomiting and could not get back to bed. She lives home alone.   She is asking for possible dexamethasone extra doses to prevent this.   S/w Dr Burr Medico and got rx for dex 4 mg daily, and to call Dr [name] on Monday and let him know how she is doing. Also zofran 8 mg q 8hr prn.   S/w daughter with directions.

## 2016-12-14 ENCOUNTER — Ambulatory Visit: Payer: Medicare Other

## 2016-12-16 ENCOUNTER — Encounter (HOSPITAL_BASED_OUTPATIENT_CLINIC_OR_DEPARTMENT_OTHER): Payer: Self-pay

## 2016-12-16 ENCOUNTER — Ambulatory Visit: Payer: Medicare Other

## 2016-12-16 ENCOUNTER — Telehealth: Payer: Self-pay | Admitting: Adult Health

## 2016-12-16 ENCOUNTER — Ambulatory Visit (HOSPITAL_BASED_OUTPATIENT_CLINIC_OR_DEPARTMENT_OTHER): Admission: RE | Admit: 2016-12-16 | Payer: Medicare Other | Source: Ambulatory Visit

## 2016-12-16 ENCOUNTER — Ambulatory Visit (HOSPITAL_BASED_OUTPATIENT_CLINIC_OR_DEPARTMENT_OTHER)
Admission: RE | Admit: 2016-12-16 | Discharge: 2016-12-16 | Disposition: A | Discharge status: Home / Self Care | Payer: Medicare Other | Source: Ambulatory Visit | Attending: Emergency Medicine | Admitting: Emergency Medicine

## 2016-12-16 DIAGNOSIS — R0600 Dyspnea, unspecified: Secondary | ICD-10-CM | POA: Diagnosis present

## 2016-12-16 DIAGNOSIS — I700 Atherosclerosis of aorta: Secondary | ICD-10-CM | POA: Diagnosis not present

## 2016-12-16 DIAGNOSIS — J439 Emphysema, unspecified: Secondary | ICD-10-CM | POA: Insufficient documentation

## 2016-12-16 DIAGNOSIS — K449 Diaphragmatic hernia without obstruction or gangrene: Secondary | ICD-10-CM | POA: Diagnosis not present

## 2016-12-16 MED ORDER — IOPAMIDOL (ISOVUE-370) INJECTION 76%
100.0000 mL | Freq: Once | INTRAVENOUS | Status: AC | PRN
  Administered 2016-12-16: 100 mL via INTRAVENOUS

## 2016-12-16 NOTE — Telephone Encounter (Signed)
Spoke with Chrys Racer at [facility] .  She wants to know if pt need CT Chest with or without contrast.  Spoke with Dr Lamonte Sakai.  Pt need CT angio chest with and without with PE protocol.  Chrys Racer verbalized understanding.  New order placed.

## 2016-12-17 ENCOUNTER — Encounter: Payer: Self-pay | Admitting: General Practice

## 2016-12-17 ENCOUNTER — Ambulatory Visit
Admission: RE | Admit: 2016-12-17 | Discharge: 2016-12-17 | Disposition: A | Discharge status: Home / Self Care | Payer: Medicare Other | Source: Ambulatory Visit | Attending: Radiation Oncology | Admitting: Radiation Oncology

## 2016-12-17 DIAGNOSIS — Z510 Encounter for antineoplastic radiation therapy: Secondary | ICD-10-CM | POA: Diagnosis not present

## 2016-12-17 NOTE — Progress Notes (Signed)
[facility] CSW Progress Note  CSW met w patient in radiation oncology waiting room, reviewed possible need for transportation assistance.  Patient declined referral and information, stating "I don't like to drive w anyone else, that's not for me."  CSW encouraged patient to recontact CSW if needs change, can discuss options including [facility] at [facility] at that time.  Edwyna Shell, LCSW Clinical Social Worker Phone:  [phone]

## 2016-12-18 ENCOUNTER — Ambulatory Visit (HOSPITAL_COMMUNITY)
Admission: RE | Admit: 2016-12-18 | Discharge: 2016-12-18 | Disposition: A | Discharge status: Home / Self Care | Payer: Medicare Other | Source: Ambulatory Visit | Attending: Internal Medicine | Admitting: Internal Medicine

## 2016-12-18 ENCOUNTER — Other Ambulatory Visit: Payer: Self-pay | Admitting: Oncology

## 2016-12-18 ENCOUNTER — Other Ambulatory Visit: Payer: Self-pay | Admitting: Hematology

## 2016-12-18 ENCOUNTER — Telehealth: Payer: Self-pay | Admitting: *Deleted

## 2016-12-18 ENCOUNTER — Other Ambulatory Visit (HOSPITAL_BASED_OUTPATIENT_CLINIC_OR_DEPARTMENT_OTHER): Payer: Medicare Other

## 2016-12-18 ENCOUNTER — Ambulatory Visit: Payer: Medicare Other | Admitting: Internal Medicine

## 2016-12-18 ENCOUNTER — Ambulatory Visit
Admission: RE | Admit: 2016-12-18 | Discharge: 2016-12-18 | Disposition: A | Discharge status: Home / Self Care | Payer: Medicare Other | Source: Ambulatory Visit | Attending: Radiation Oncology | Admitting: Radiation Oncology

## 2016-12-18 DIAGNOSIS — C3411 Malignant neoplasm of upper lobe, right bronchus or lung: Secondary | ICD-10-CM | POA: Diagnosis not present

## 2016-12-18 DIAGNOSIS — Z510 Encounter for antineoplastic radiation therapy: Secondary | ICD-10-CM | POA: Diagnosis not present

## 2016-12-18 DIAGNOSIS — T451X5A Adverse effect of antineoplastic and immunosuppressive drugs, initial encounter: Principal | ICD-10-CM

## 2016-12-18 DIAGNOSIS — D6481 Anemia due to antineoplastic chemotherapy: Secondary | ICD-10-CM | POA: Insufficient documentation

## 2016-12-18 LAB — COMPREHENSIVE METABOLIC PANEL
ALBUMIN: 3.6 g/dL (ref 3.5–5.0)
ALK PHOS: 78 U/L (ref 40–150)
ALT: 10 U/L (ref 0–55)
AST: 9 U/L (ref 5–34)
Anion Gap: 10 mEq/L (ref 3–11)
BUN: 38.6 mg/dL — ABNORMAL HIGH (ref 7.0–26.0)
CO2: 20 mEq/L — ABNORMAL LOW (ref 22–29)
Calcium: 9.3 mg/dL (ref 8.4–10.4)
Chloride: 105 mEq/L (ref 98–109)
Creatinine: 1.2 mg/dL — ABNORMAL HIGH (ref 0.6–1.1)
EGFR: 46 mL/min/{1.73_m2} — AB (ref >60)
GLUCOSE: 94 mg/dL (ref 70–140)
POTASSIUM: 4.2 meq/L (ref 3.5–5.1)
SODIUM: 134 meq/L — AB (ref 136–145)
Total Bilirubin: 0.33 mg/dL (ref 0.20–1.20)
Total Protein: 6.8 g/dL (ref 6.4–8.3)

## 2016-12-18 LAB — CBC WITH DIFFERENTIAL/PLATELET
BASO%: 0 % (ref 0.0–2.0)
Basophils Absolute: 0 10*3/uL (ref 0.0–0.1)
EOS%: 1.1 % (ref 0.0–7.0)
Eosinophils Absolute: 0 10*3/uL (ref 0.0–0.5)
HEMATOCRIT: 24.9 % — AB (ref 34.8–46.6)
HEMOGLOBIN: 8.2 g/dL — AB (ref 11.6–15.9)
LYMPH#: 0.8 10*3/uL — AB (ref 0.9–3.3)
LYMPH%: 28.4 % (ref 14.0–49.7)
MCH: 29.6 pg (ref 25.1–34.0)
MCHC: 32.9 g/dL (ref 31.5–36.0)
MCV: 89.9 fL (ref 79.5–101.0)
MONO#: 0 10*3/uL — AB (ref 0.1–0.9)
MONO%: 1.5 % (ref 0.0–14.0)
NEUT#: 1.8 10*3/uL (ref 1.5–6.5)
NEUT%: 69 % (ref 38.4–76.8)
PLATELETS: 55 10*3/uL — AB (ref 145–400)
RBC: 2.77 10*6/uL — ABNORMAL LOW (ref 3.70–5.45)
RDW: 17 % — AB (ref 11.2–14.5)
WBC: 2.6 10*3/uL — AB (ref 3.9–10.3)

## 2016-12-18 LAB — MAGNESIUM: Magnesium: 1.7 mg/dl (ref 1.5–2.5)

## 2016-12-18 NOTE — Telephone Encounter (Signed)
TCT patient as her HGB is 8.2 today. Pt states that she feels extremely fatigued, short of breath and that her legs feel very heavy. Advised patient (per Mikey Bussing, NP) that pt would feel better with blood transfusion as she is symptomatic from this anemia. Pt is agreeable.  Arrangements made for pt to have type and cross after her radiation appt tomorrow and then be transfused starting at 12:30 pm  Pt voices understnading of appts tomorrow.  High priority scheduling message sent. Called for [facility].

## 2016-12-18 NOTE — Progress Notes (Signed)
Patient is set up for a blood transfusion on 12/19/2016.  Orders entered for type and crossmatch and for 2 units of packed red blood cells with premedications including Tylenol and Benadryl.

## 2016-12-19 ENCOUNTER — Ambulatory Visit (HOSPITAL_BASED_OUTPATIENT_CLINIC_OR_DEPARTMENT_OTHER): Payer: Medicare Other

## 2016-12-19 ENCOUNTER — Ambulatory Visit
Admission: RE | Admit: 2016-12-19 | Discharge: 2016-12-19 | Disposition: A | Discharge status: Home / Self Care | Payer: Medicare Other | Source: Ambulatory Visit | Attending: Radiation Oncology | Admitting: Radiation Oncology

## 2016-12-19 ENCOUNTER — Other Ambulatory Visit: Payer: Medicare Other

## 2016-12-19 ENCOUNTER — Other Ambulatory Visit: Payer: Self-pay | Admitting: Oncology

## 2016-12-19 ENCOUNTER — Ambulatory Visit: Payer: Medicare Other | Admitting: Emergency Medicine

## 2016-12-19 DIAGNOSIS — Z510 Encounter for antineoplastic radiation therapy: Secondary | ICD-10-CM | POA: Diagnosis not present

## 2016-12-19 DIAGNOSIS — D6481 Anemia due to antineoplastic chemotherapy: Secondary | ICD-10-CM

## 2016-12-19 DIAGNOSIS — T451X5A Adverse effect of antineoplastic and immunosuppressive drugs, initial encounter: Principal | ICD-10-CM

## 2016-12-19 LAB — ABO/RH: ABO/RH(D): B POS

## 2016-12-19 LAB — PREPARE RBC (CROSSMATCH)

## 2016-12-19 MED ORDER — ACETAMINOPHEN 325 MG PO TABS
650.0000 mg | ORAL_TABLET | Freq: Once | ORAL | Status: AC
  Administered 2016-12-19: 650 mg via ORAL

## 2016-12-19 MED ORDER — DIPHENHYDRAMINE HCL 25 MG PO CAPS
ORAL_CAPSULE | ORAL | Status: AC
  Filled 2016-12-19: qty 1

## 2016-12-19 MED ORDER — SODIUM CHLORIDE 0.9 % IV SOLN
250.0000 mL | Freq: Once | INTRAVENOUS | Status: AC
  Administered 2016-12-19: 250 mL via INTRAVENOUS

## 2016-12-19 MED ORDER — ACETAMINOPHEN 325 MG PO TABS
ORAL_TABLET | ORAL | Status: AC
  Filled 2016-12-19: qty 2

## 2016-12-19 MED ORDER — DIPHENHYDRAMINE HCL 25 MG PO CAPS
25.0000 mg | ORAL_CAPSULE | Freq: Once | ORAL | Status: AC
  Administered 2016-12-19: 25 mg via ORAL

## 2016-12-19 NOTE — Patient Instructions (Signed)

## 2016-12-20 ENCOUNTER — Ambulatory Visit
Admission: RE | Admit: 2016-12-20 | Discharge: 2016-12-20 | Disposition: A | Discharge status: Home / Self Care | Payer: Medicare Other | Source: Ambulatory Visit | Attending: Radiation Oncology | Admitting: Radiation Oncology

## 2016-12-20 DIAGNOSIS — Z510 Encounter for antineoplastic radiation therapy: Secondary | ICD-10-CM | POA: Diagnosis not present

## 2016-12-20 LAB — BPAM RBC
BLOOD PRODUCT EXPIRATION DATE: 201901082359
BLOOD PRODUCT EXPIRATION DATE: 201901082359
ISSUE DATE / TIME: 201812061305
ISSUE DATE / TIME: 201812061305
UNIT TYPE AND RH: 7300
UNIT TYPE AND RH: 7300

## 2016-12-20 LAB — TYPE AND SCREEN
ABO/RH(D): B POS
Antibody Screen: NEGATIVE
Unit division: 0
Unit division: 0

## 2016-12-23 ENCOUNTER — Ambulatory Visit: Payer: Medicare Other

## 2016-12-24 ENCOUNTER — Other Ambulatory Visit: Payer: Medicare Other

## 2016-12-24 ENCOUNTER — Ambulatory Visit: Payer: Medicare Other | Admitting: Emergency Medicine

## 2016-12-24 ENCOUNTER — Ambulatory Visit: Payer: Medicare Other

## 2016-12-25 ENCOUNTER — Ambulatory Visit
Admission: RE | Admit: 2016-12-25 | Discharge: 2016-12-25 | Disposition: A | Discharge status: Home / Self Care | Payer: Medicare Other | Source: Ambulatory Visit | Attending: Radiation Oncology | Admitting: Radiation Oncology

## 2016-12-25 DIAGNOSIS — Z510 Encounter for antineoplastic radiation therapy: Secondary | ICD-10-CM | POA: Diagnosis not present

## 2016-12-26 ENCOUNTER — Ambulatory Visit
Admission: RE | Admit: 2016-12-26 | Discharge: 2016-12-26 | Disposition: A | Discharge status: Home / Self Care | Payer: Medicare Other | Source: Ambulatory Visit | Attending: Radiation Oncology | Admitting: Radiation Oncology

## 2016-12-26 DIAGNOSIS — Z510 Encounter for antineoplastic radiation therapy: Secondary | ICD-10-CM | POA: Diagnosis not present

## 2016-12-27 ENCOUNTER — Ambulatory Visit
Admission: RE | Admit: 2016-12-27 | Discharge: 2016-12-27 | Disposition: A | Discharge status: Home / Self Care | Payer: Medicare Other | Source: Ambulatory Visit | Attending: Radiation Oncology | Admitting: Radiation Oncology

## 2016-12-27 DIAGNOSIS — Z510 Encounter for antineoplastic radiation therapy: Secondary | ICD-10-CM | POA: Diagnosis not present

## 2016-12-30 ENCOUNTER — Ambulatory Visit
Admission: RE | Admit: 2016-12-30 | Discharge: 2016-12-30 | Disposition: A | Discharge status: Home / Self Care | Payer: Medicare Other | Source: Ambulatory Visit | Attending: Radiation Oncology | Admitting: Radiation Oncology

## 2016-12-30 DIAGNOSIS — Z510 Encounter for antineoplastic radiation therapy: Secondary | ICD-10-CM | POA: Diagnosis not present

## 2016-12-31 ENCOUNTER — Ambulatory Visit (HOSPITAL_BASED_OUTPATIENT_CLINIC_OR_DEPARTMENT_OTHER): Payer: Medicare Other | Admitting: Internal Medicine

## 2016-12-31 ENCOUNTER — Ambulatory Visit
Admission: RE | Admit: 2016-12-31 | Discharge: 2016-12-31 | Disposition: A | Discharge status: Home / Self Care | Payer: Medicare Other | Source: Ambulatory Visit | Attending: Radiation Oncology | Admitting: Radiation Oncology

## 2016-12-31 ENCOUNTER — Encounter: Payer: Self-pay | Admitting: Internal Medicine

## 2016-12-31 ENCOUNTER — Ambulatory Visit: Payer: Medicare Other

## 2016-12-31 ENCOUNTER — Other Ambulatory Visit (HOSPITAL_BASED_OUTPATIENT_CLINIC_OR_DEPARTMENT_OTHER): Payer: Medicare Other

## 2016-12-31 ENCOUNTER — Ambulatory Visit (HOSPITAL_BASED_OUTPATIENT_CLINIC_OR_DEPARTMENT_OTHER): Payer: Medicare Other

## 2016-12-31 VITALS — BP 137/69 | HR 113 | Temp 98.1°F | Resp 20 | Ht 65.0 in | Wt 142.3 lb

## 2016-12-31 VITALS — HR 99

## 2016-12-31 DIAGNOSIS — C3411 Malignant neoplasm of upper lobe, right bronchus or lung: Secondary | ICD-10-CM

## 2016-12-31 DIAGNOSIS — Z5111 Encounter for antineoplastic chemotherapy: Secondary | ICD-10-CM | POA: Diagnosis not present

## 2016-12-31 DIAGNOSIS — C3490 Malignant neoplasm of unspecified part of unspecified bronchus or lung: Secondary | ICD-10-CM

## 2016-12-31 DIAGNOSIS — Z510 Encounter for antineoplastic radiation therapy: Secondary | ICD-10-CM | POA: Diagnosis not present

## 2016-12-31 LAB — COMPREHENSIVE METABOLIC PANEL
ALT: 14 U/L (ref 0–55)
ANION GAP: 9 meq/L (ref 3–11)
AST: 13 U/L (ref 5–34)
Albumin: 3.5 g/dL (ref 3.5–5.0)
Alkaline Phosphatase: 82 U/L (ref 40–150)
BILIRUBIN TOTAL: 0.27 mg/dL (ref 0.20–1.20)
BUN: 28.4 mg/dL — ABNORMAL HIGH (ref 7.0–26.0)
CALCIUM: 9.3 mg/dL (ref 8.4–10.4)
CHLORIDE: 104 meq/L (ref 98–109)
CO2: 23 meq/L (ref 22–29)
Creatinine: 1.1 mg/dL (ref 0.6–1.1)
EGFR: 50 mL/min/{1.73_m2} — AB (ref >60)
Glucose: 116 mg/dl (ref 70–140)
Potassium: 4 mEq/L (ref 3.5–5.1)
Sodium: 136 mEq/L (ref 136–145)
Total Protein: 7 g/dL (ref 6.4–8.3)

## 2016-12-31 LAB — CBC WITH DIFFERENTIAL/PLATELET
BASO%: 0.4 % (ref 0.0–2.0)
BASOS ABS: 0 10*3/uL (ref 0.0–0.1)
EOS ABS: 0.1 10*3/uL (ref 0.0–0.5)
EOS%: 1 % (ref 0.0–7.0)
HEMATOCRIT: 32.7 % — AB (ref 34.8–46.6)
HGB: 10.9 g/dL — ABNORMAL LOW (ref 11.6–15.9)
LYMPH%: 17.7 % (ref 14.0–49.7)
MCH: 29.9 pg (ref 25.1–34.0)
MCHC: 33.3 g/dL (ref 31.5–36.0)
MCV: 89.6 fL (ref 79.5–101.0)
MONO#: 0.8 10*3/uL (ref 0.1–0.9)
MONO%: 16.2 % — ABNORMAL HIGH (ref 0.0–14.0)
NEUT#: 3.4 10*3/uL (ref 1.5–6.5)
NEUT%: 64.7 % (ref 38.4–76.8)
PLATELETS: 91 10*3/uL — AB (ref 145–400)
RBC: 3.65 10*6/uL — AB (ref 3.70–5.45)
RDW: 15.4 % — ABNORMAL HIGH (ref 11.2–14.5)
WBC: 5.2 10*3/uL (ref 3.9–10.3)
lymph#: 0.9 10*3/uL (ref 0.9–3.3)

## 2016-12-31 LAB — MAGNESIUM: MAGNESIUM: 1.7 mg/dL (ref 1.5–2.5)

## 2016-12-31 MED ORDER — SODIUM CHLORIDE 0.9 % IV SOLN
80.0000 mg/m2 | Freq: Once | INTRAVENOUS | Status: AC
  Administered 2016-12-31: 137 mg via INTRAVENOUS
  Filled 2016-12-31: qty 137

## 2016-12-31 MED ORDER — POTASSIUM CHLORIDE 2 MEQ/ML IV SOLN
Freq: Once | INTRAVENOUS | Status: AC
  Administered 2016-12-31: 11:00:00 via INTRAVENOUS
  Filled 2016-12-31: qty 10

## 2016-12-31 MED ORDER — SODIUM CHLORIDE 0.9 % IV SOLN
Freq: Once | INTRAVENOUS | Status: AC
  Administered 2016-12-31: 12:00:00 via INTRAVENOUS
  Filled 2016-12-31: qty 5

## 2016-12-31 MED ORDER — PALONOSETRON HCL INJECTION 0.25 MG/5ML
INTRAVENOUS | Status: AC
  Filled 2016-12-31: qty 5

## 2016-12-31 MED ORDER — SODIUM CHLORIDE 0.9 % IV SOLN
90.0000 mg/m2 | Freq: Once | INTRAVENOUS | Status: AC
  Administered 2016-12-31: 150 mg via INTRAVENOUS
  Filled 2016-12-31: qty 7.5

## 2016-12-31 MED ORDER — PALONOSETRON HCL INJECTION 0.25 MG/5ML
0.2500 mg | Freq: Once | INTRAVENOUS | Status: AC
  Administered 2016-12-31: 0.25 mg via INTRAVENOUS

## 2016-12-31 MED ORDER — SODIUM CHLORIDE 0.9 % IV SOLN
Freq: Once | INTRAVENOUS | Status: AC
  Administered 2016-12-31: 10:00:00 via INTRAVENOUS

## 2016-12-31 NOTE — Patient Instructions (Signed)
[facility] Discharge Instructions for Patients Receiving Chemotherapy  Today you received the following chemotherapy agents: Cisplatin (Platinol) and Etoposide.  To help prevent nausea and vomiting after your treatment, we encourage you to take your nausea medication as directed.  If you develop nausea and vomiting that is not controlled by your nausea medication, call the clinic.   BELOW ARE SYMPTOMS THAT SHOULD BE REPORTED IMMEDIATELY:  *FEVER GREATER THAN 100.5 F  *CHILLS WITH OR WITHOUT FEVER  NAUSEA AND VOMITING THAT IS NOT CONTROLLED WITH YOUR NAUSEA MEDICATION  *UNUSUAL SHORTNESS OF BREATH  *UNUSUAL BRUISING OR BLEEDING  TENDERNESS IN MOUTH AND THROAT WITH OR WITHOUT PRESENCE OF ULCERS  *URINARY PROBLEMS  *BOWEL PROBLEMS  UNUSUAL RASH Items with * indicate a potential emergency and should be followed up as soon as possible.  Feel free to call the clinic should you have any questions or concerns. The clinic phone number is [phone].  Please show the [facility] at check-in to the Emergency Department and triage nurse.

## 2016-12-31 NOTE — Progress Notes (Signed)
Nutrition Follow-up:  Patient with lung cancer and currently receiving chemotherapy.    Met with patient during infusion today.  Patient continues to report, "my appetite is too good."  Patient eating on peanut butter and graham crackers during visit.  Patient reports no problems with nausea recently.  Reports that she eats oatmeal for breakfast, then may have sandwich or eggs for lunch and eats frozen meal for dinner.  Reports that she continues to drink 1-2 ensure everyday.     Medications: reviewed  Labs: reviewed  Anthropometrics:   Weight is stable at 142 lb today same as in November.    NUTRITION DIAGNOSIS: Inadequate oral intake improving   INTERVENTION:   Encouraged patient to continue to drink oral nutrition supplements for added calories and protein Encouraged patient to continue to eat good sources of calories and protein to prevent weight loss.     MONITORING, EVALUATION, GOAL: Patient will consume adequate calories and protein to maintain weight during treatment   NEXT VISIT: as needed  Robyn Barton, [place], [place] Registered Dietitian [phone] (pager)

## 2016-12-31 NOTE — Progress Notes (Signed)
Pt requested L anterior forearm IV be restarted due to intermittent pain. Pt stated "it works better when they look and look for a long time."  Brisk blood return noted at site, no edema or erythema noted. IV discontinued. Jan, RN in to restart IV.  Per Stanton Kidney, RN, Dr. [name] gave the OK to treat with PLT 91k. Pharmacy notified.

## 2016-12-31 NOTE — Progress Notes (Signed)
[place] [phone]) [phone]   [phone]) [phone]  OFFICE PROGRESS NOTE  [name], MD [address] [place] [address]  DIAGNOSIS: Recurrent small cell lung cancer initially diagnosed as Limited stage (T1a, N0, M0) small cell lung cancer diagnosed in June 2018.  PRIOR THERAPY: right upper lobectomy on 06/26/2016 and the final pathology was consistent with small cell lung cancer measuring 1.6 cm in size.  CURRENT THERAPY: systemic chemotherapy with cisplatin 60 MG/M2 on day 1 and etoposide 120 MG/M2 on days 1, 2 and 3 every 3 weeks.  Status post 3 cycles.  INTERVAL HISTORY: Robyn Barton 71 y.o. female returns to the clinic today for follow-up visit.  The patient is feeling fine today with no specific complaints.  She tolerated the last cycle of her treatment fairly well.  She denied having any chest pain, shortness breath, cough or hemoptysis.  She denied having any fever or chills.  She has no nausea, vomiting, diarrhea or constipation.  She is here today for evaluation before starting cycle #4 of her systemic chemotherapy.  She is also expected to complete the course of concurrent radiotherapy on January 13, 2017.  MEDICAL HISTORY: Past Medical History:  Diagnosis Date  . Allergic rhinitis   . Anxiety   . Chest pain   . Elevated TSH    pt denies  . Emphysema lung ([facility])   . Enlarged lymph nodes    in chest  . GERD (gastroesophageal reflux disease)   . Goals of care, counseling/discussion 10/21/2016  . History of hiatal hernia   . Hyperlipidemia   . Hypoxemia   . IFG (impaired fasting glucose)   . Osteopenia   . Pneumonia    several  times last 2009  . Pulmonary nodule   . Recurrent major depressive disorder ([facility])   . Sleep apnea    mild no cpap needed  . Small cell carcinoma of right lung ([facility]) 2018   surgery right middle lobe removed   . Tobacco dependence   . Varicose veins of both lower extremities   . Vitamin D deficiency      ALLERGIES:  has No Known Allergies.  MEDICATIONS:  Current Outpatient Medications  Medication Sig Dispense Refill  . albuterol (PROVENTIL HFA;VENTOLIN HFA) 108 (90 Base) MCG/ACT inhaler Inhale 2 puffs into the lungs every 6 (six) hours as needed for wheezing or shortness of breath.    Marland Kitchen alendronate (FOSAMAX) 70 MG tablet Take 70 mg by mouth every Monday. Take with a full glass of water on an empty stomach.     Marland Kitchen atorvastatin (LIPITOR) 40 MG tablet Take 40 mg by mouth at bedtime.     . calcium carbonate (TUMS - DOSED IN MG ELEMENTAL CALCIUM) 500 MG chewable tablet Chew 1 tablet daily as needed by mouth for indigestion or heartburn.    . cephALEXin (KEFLEX) 500 MG capsule Take 1 capsule (500 mg total) by mouth 4 (four) times daily. (Patient not taking: Reported on 11/24/2016) 28 capsule 0  . Cholecalciferol (VITAMIN D) 2000 units tablet Take 2,000 Units by mouth daily.    Marland Kitchen dexamethasone (DECADRON) 4 MG tablet Take 1 tablet (4 mg total) by mouth daily. 10 tablet 0  . diphenhydramine-acetaminophen (TYLENOL PM) 25-500 MG TABS tablet Take 1 tablet at bedtime as needed by mouth (sleep).    Marland Kitchen HYDROcodone-acetaminophen (NORCO/VICODIN) 5-325 MG tablet Take 1 tablet by mouth every 4 (four) hours as needed. (Patient not taking: Reported on  11/24/2016) 10 tablet 0  . loperamide (IMODIUM) 2 MG capsule Take 2 mg as needed by mouth for diarrhea or loose stools.    Marland Kitchen loratadine (CLARITIN) 10 MG tablet Take 10 mg by mouth daily as needed (hay fever).    . magnesium oxide (MAG-OX) 400 MG tablet Take 1 tablet by mouth 3 (three) times daily.  1  . omeprazole (PRILOSEC) 40 MG capsule Take 40 mg by mouth daily.   3  . ondansetron (ZOFRAN) 8 MG tablet Take 1 tablet (8 mg total) by mouth every 8 (eight) hours as needed for nausea or vomiting. 30 tablet 0  . prochlorperazine (COMPAZINE) 10 MG tablet TAKE 1 TABLET (10 MG TOTAL) BY MOUTH EVERY 6 (SIX) HOURS AS NEEDED FOR NAUSEA OR VOMITING. 30 tablet 0  . sertraline  (ZOLOFT) 50 MG tablet Take 2.5 tablets (125 mg total) daily by mouth. 75 tablet 0  . sucralfate (CARAFATE) 1 g tablet Take 1 tablet (1 g total) by mouth 4 (four) times daily -  with meals and at bedtime. 5 minutes before food for esophagitis 120 tablet 30  . tiotropium (SPIRIVA) 18 MCG inhalation capsule Place 18 mcg into inhaler and inhale daily.    . Wound Cleansers (RADIAPLEX EX) Apply topically.     No current facility-administered medications for this visit.     SURGICAL HISTORY:  Past Surgical History:  Procedure Laterality Date  . ABDOMINAL HYSTERECTOMY     1 ovary left  . ENDOBRONCHIAL ULTRASOUND Bilateral 10/15/2016   Procedure: ENDOBRONCHIAL ULTRASOUND;  Surgeon: Collene Gobble, MD;  Location: [facility];  Service: Cardiopulmonary;  Laterality: Bilateral;  . INCONTINENCE SURGERY    . right middle lobe lung surgery  06/26/2016   [facility]    REVIEW OF SYSTEMS:  A comprehensive review of systems was negative except for: Constitutional: positive for fatigue   PHYSICAL EXAMINATION: General appearance: alert, cooperative, fatigued and no distress Head: Normocephalic, without obvious abnormality, atraumatic Neck: no adenopathy, no JVD, supple, symmetrical, trachea midline and thyroid not enlarged, symmetric, no tenderness/mass/nodules Lymph nodes: Cervical, supraclavicular, and axillary nodes normal. Resp: clear to auscultation bilaterally Back: symmetric, no curvature. ROM normal. No CVA tenderness. Cardio: regular rate and rhythm, S1, S2 normal, no murmur, click, rub or gallop GI: soft, non-tender; bowel sounds normal; no masses,  no organomegaly Extremities: extremities normal, atraumatic, no cyanosis or edema  ECOG PERFORMANCE STATUS: 1 - Symptomatic but completely ambulatory  Blood pressure 137/69, pulse (!) 113, temperature 98.1 F (36.7 C), temperature source Oral, resp. rate 20, height 5\' 5"  (1.651 m), weight 142 lb 4.8 oz (64.5 kg), SpO2 99 %.  LABORATORY DATA: Lab  Results  Component Value Date   WBC 5.2 12/31/2016   HGB 10.9 (L) 12/31/2016   HCT 32.7 (L) 12/31/2016   MCV 89.6 12/31/2016   PLT 91 (L) 12/31/2016      Chemistry      Component Value Date/Time   NA 136 12/31/2016 0839   K 4.0 12/31/2016 0839   CL 106 11/26/2016 0346   CO2 23 12/31/2016 0839   BUN 28.4 (H) 12/31/2016 0839   CREATININE 1.1 12/31/2016 0839      Component Value Date/Time   CALCIUM 9.3 12/31/2016 0839   ALKPHOS 82 12/31/2016 0839   AST 13 12/31/2016 0839   ALT 14 12/31/2016 0839   BILITOT 0.27 12/31/2016 0839       RADIOGRAPHIC STUDIES: Ct Angio Chest W/cm &/or Wo Cm  Result Date: 12/16/2016 CLINICAL DATA:  Shortness of  breath. Followup of small-cell lung cancer. Gastroesophageal reflux disease. Ex-smoker. Hyperlipidemia. Status post right middle lobectomy 06/26/2016. EXAM: CT ANGIOGRAPHY CHEST WITH CONTRAST TECHNIQUE: Multidetector CT imaging of the chest was performed using the standard protocol during bolus administration of intravenous contrast. Multiplanar CT image reconstructions and MIPs were obtained to evaluate the vascular anatomy. CONTRAST:  162mL ISOVUE-370 IOPAMIDOL (ISOVUE-370) INJECTION 76% COMPARISON:  11/15/2016 CT.  09/26/2016 PET. FINDINGS: Cardiovascular: The quality of this exam for evaluation of pulmonary embolism is good. No evidence of pulmonary embolism. Aortic and branch vessel atherosclerosis. Tortuous thoracic aorta. Heart size upper normal, without pericardial effusion. Mediastinum/Nodes: Subcarinal node is further decreased in size, 5 mm on image 37/series 4. 10 mm on the most recent CT. No hilar adenopathy. Tiny hiatal hernia. Lungs/Pleura: No pleural fluid. Status post right middle lobectomy. Mild to moderate centrilobular emphysema. Anterior right upper lobe scarring. Upper Abdomen: Normal imaged portions of the liver, spleen, pancreas, kidneys, right adrenal gland. Mild left adrenal thickening is similar. Musculoskeletal: No acute  osseous abnormality. Convex left thoracic spine curvature Review of the MIP images confirms the above findings. IMPRESSION: 1.  No evidence of pulmonary embolism. 2. Status post right middle lobectomy. Further decrease in size of subcarinal lymph node. No evidence of recurrent or metastatic disease. 3. Tiny hiatal hernia. 4.  Aortic Atherosclerosis (ICD10-I70.0). 5.  Emphysema (ICD10-J43.9). Electronically Signed   By: Abigail Miyamoto M.D.   On: 12/16/2016 11:06    ASSESSMENT AND PLAN: This is a very pleasant 71 years old white female with history of limited stage small cell lung cancer status post left upper lobectomy in June 2018.  The patient had evidence for disease recurrence with hypermetabolic subcarinal lymphadenopathy that was biopsy-proven to be small cell carcinoma. She is currently undergoing systemic chemotherapy with cisplatin and etoposide.  She is status post 3 cycles.  This is concurrent with radiotherapy.  She continues to tolerate the treatment well. I recommended for the patient to proceed with cycle #4 today as a scheduled. I will see her back for follow-up visit in 1 month for evaluation after repeating CT scan of the chest for restaging of her disease.  The patient was advised to call immediately if she has any concerning symptoms in the interval. The patient voices understanding of current disease status and treatment options and is in agreement with the current care plan.  All questions were answered. The patient knows to call the clinic with any problems, questions or concerns. We can certainly see the patient much sooner if necessary.  Disclaimer: This note was dictated with voice recognition software. Similar sounding words can inadvertently be transcribed and may not be corrected upon review.

## 2017-01-01 ENCOUNTER — Ambulatory Visit
Admission: RE | Admit: 2017-01-01 | Discharge: 2017-01-01 | Disposition: A | Discharge status: Home / Self Care | Payer: Medicare Other | Source: Ambulatory Visit | Attending: Radiation Oncology | Admitting: Radiation Oncology

## 2017-01-01 ENCOUNTER — Ambulatory Visit (HOSPITAL_BASED_OUTPATIENT_CLINIC_OR_DEPARTMENT_OTHER): Payer: Medicare Other

## 2017-01-01 DIAGNOSIS — Z510 Encounter for antineoplastic radiation therapy: Secondary | ICD-10-CM | POA: Diagnosis not present

## 2017-01-01 DIAGNOSIS — C3411 Malignant neoplasm of upper lobe, right bronchus or lung: Secondary | ICD-10-CM

## 2017-01-01 DIAGNOSIS — Z5111 Encounter for antineoplastic chemotherapy: Secondary | ICD-10-CM

## 2017-01-01 MED ORDER — DEXAMETHASONE SODIUM PHOSPHATE 10 MG/ML IJ SOLN
INTRAMUSCULAR | Status: AC
  Filled 2017-01-01: qty 1

## 2017-01-01 MED ORDER — SODIUM CHLORIDE 0.9 % IV SOLN
90.0000 mg/m2 | Freq: Once | INTRAVENOUS | Status: AC
  Administered 2017-01-01: 150 mg via INTRAVENOUS
  Filled 2017-01-01: qty 7.5

## 2017-01-01 MED ORDER — DEXAMETHASONE SODIUM PHOSPHATE 10 MG/ML IJ SOLN
10.0000 mg | Freq: Once | INTRAMUSCULAR | Status: AC
  Administered 2017-01-01: 10 mg via INTRAVENOUS

## 2017-01-01 MED ORDER — SODIUM CHLORIDE 0.9 % IV SOLN
Freq: Once | INTRAVENOUS | Status: AC
  Administered 2017-01-01: 11:00:00 via INTRAVENOUS

## 2017-01-01 NOTE — Patient Instructions (Signed)
[facility] Discharge Instructions for Patients Receiving Chemotherapy  Today you received the following chemotherapy agents: Etoposide   To help prevent nausea and vomiting after your treatment, we encourage you to take your nausea medication as directed.    If you develop nausea and vomiting that is not controlled by your nausea medication, call the clinic.   BELOW ARE SYMPTOMS THAT SHOULD BE REPORTED IMMEDIATELY:  *FEVER GREATER THAN 100.5 F  *CHILLS WITH OR WITHOUT FEVER  NAUSEA AND VOMITING THAT IS NOT CONTROLLED WITH YOUR NAUSEA MEDICATION  *UNUSUAL SHORTNESS OF BREATH  *UNUSUAL BRUISING OR BLEEDING  TENDERNESS IN MOUTH AND THROAT WITH OR WITHOUT PRESENCE OF ULCERS  *URINARY PROBLEMS  *BOWEL PROBLEMS  UNUSUAL RASH Items with * indicate a potential emergency and should be followed up as soon as possible.  Feel free to call the clinic should you have any questions or concerns. The clinic phone number is [phone].  Please show the [facility] at check-in to the Emergency Department and triage nurse.

## 2017-01-02 ENCOUNTER — Ambulatory Visit
Admission: RE | Admit: 2017-01-02 | Discharge: 2017-01-02 | Disposition: A | Discharge status: Home / Self Care | Payer: Medicare Other | Source: Ambulatory Visit | Attending: Radiation Oncology | Admitting: Radiation Oncology

## 2017-01-02 ENCOUNTER — Ambulatory Visit (HOSPITAL_BASED_OUTPATIENT_CLINIC_OR_DEPARTMENT_OTHER): Payer: Medicare Other

## 2017-01-02 ENCOUNTER — Telehealth: Payer: Self-pay | Admitting: Internal Medicine

## 2017-01-02 VITALS — BP 166/84 | HR 90 | Temp 97.6°F | Resp 20

## 2017-01-02 DIAGNOSIS — Z5189 Encounter for other specified aftercare: Secondary | ICD-10-CM | POA: Diagnosis not present

## 2017-01-02 DIAGNOSIS — C3411 Malignant neoplasm of upper lobe, right bronchus or lung: Secondary | ICD-10-CM | POA: Diagnosis not present

## 2017-01-02 DIAGNOSIS — Z510 Encounter for antineoplastic radiation therapy: Secondary | ICD-10-CM | POA: Diagnosis not present

## 2017-01-02 DIAGNOSIS — Z5111 Encounter for antineoplastic chemotherapy: Secondary | ICD-10-CM | POA: Diagnosis not present

## 2017-01-02 MED ORDER — PEGFILGRASTIM 6 MG/0.6ML ~~LOC~~ PSKT
6.0000 mg | PREFILLED_SYRINGE | Freq: Once | SUBCUTANEOUS | Status: AC
  Administered 2017-01-02: 6 mg via SUBCUTANEOUS

## 2017-01-02 MED ORDER — DEXAMETHASONE SODIUM PHOSPHATE 10 MG/ML IJ SOLN
10.0000 mg | Freq: Once | INTRAMUSCULAR | Status: AC
  Administered 2017-01-02: 10 mg via INTRAVENOUS

## 2017-01-02 MED ORDER — PEGFILGRASTIM 6 MG/0.6ML ~~LOC~~ PSKT
PREFILLED_SYRINGE | SUBCUTANEOUS | Status: AC
  Filled 2017-01-02: qty 0.6

## 2017-01-02 MED ORDER — DEXAMETHASONE SODIUM PHOSPHATE 10 MG/ML IJ SOLN
INTRAMUSCULAR | Status: AC
  Filled 2017-01-02: qty 1

## 2017-01-02 MED ORDER — SODIUM CHLORIDE 0.9 % IV SOLN
Freq: Once | INTRAVENOUS | Status: AC
  Administered 2017-01-02: 11:00:00 via INTRAVENOUS

## 2017-01-02 MED ORDER — SODIUM CHLORIDE 0.9 % IV SOLN
90.0000 mg/m2 | Freq: Once | INTRAVENOUS | Status: AC
  Administered 2017-01-02: 150 mg via INTRAVENOUS
  Filled 2017-01-02: qty 7.5

## 2017-01-02 NOTE — Telephone Encounter (Signed)
Left message for patient regarding upcoming January 2019 appointments.

## 2017-01-02 NOTE — Patient Instructions (Signed)
[facility] Discharge Instructions for Patients Receiving Chemotherapy  Today you received the following chemotherapy agents Etoposide  To help prevent nausea and vomiting after your treatment, we encourage you to take your nausea medication as directed   If you develop nausea and vomiting that is not controlled by your nausea medication, call the clinic.   BELOW ARE SYMPTOMS THAT SHOULD BE REPORTED IMMEDIATELY:  *FEVER GREATER THAN 100.5 F  *CHILLS WITH OR WITHOUT FEVER  NAUSEA AND VOMITING THAT IS NOT CONTROLLED WITH YOUR NAUSEA MEDICATION  *UNUSUAL SHORTNESS OF BREATH  *UNUSUAL BRUISING OR BLEEDING  TENDERNESS IN MOUTH AND THROAT WITH OR WITHOUT PRESENCE OF ULCERS  *URINARY PROBLEMS  *BOWEL PROBLEMS  UNUSUAL RASH Items with * indicate a potential emergency and should be followed up as soon as possible.  Feel free to call the clinic should you have any questions or concerns. The clinic phone number is [phone].  Please show the [facility] at check-in to the Emergency Department and triage nurse.

## 2017-01-02 NOTE — Progress Notes (Signed)
Per Dr. [name], okay to treat with abnormal labs.

## 2017-01-03 ENCOUNTER — Other Ambulatory Visit: Payer: Self-pay | Admitting: Medical Oncology

## 2017-01-03 ENCOUNTER — Ambulatory Visit
Admission: RE | Admit: 2017-01-03 | Discharge: 2017-01-03 | Disposition: A | Discharge status: Home / Self Care | Payer: Medicare Other | Source: Ambulatory Visit | Attending: Radiation Oncology | Admitting: Radiation Oncology

## 2017-01-03 DIAGNOSIS — Z510 Encounter for antineoplastic radiation therapy: Secondary | ICD-10-CM | POA: Diagnosis not present

## 2017-01-04 ENCOUNTER — Ambulatory Visit: Payer: Medicare Other

## 2017-01-06 ENCOUNTER — Ambulatory Visit: Payer: Medicare Other

## 2017-01-08 ENCOUNTER — Ambulatory Visit: Payer: Medicare Other

## 2017-01-09 ENCOUNTER — Ambulatory Visit: Payer: Medicare Other

## 2017-01-09 ENCOUNTER — Ambulatory Visit
Admission: RE | Admit: 2017-01-09 | Discharge: 2017-01-09 | Disposition: A | Discharge status: Home / Self Care | Payer: Medicare Other | Source: Ambulatory Visit | Attending: Radiation Oncology | Admitting: Radiation Oncology

## 2017-01-09 DIAGNOSIS — Z510 Encounter for antineoplastic radiation therapy: Secondary | ICD-10-CM | POA: Diagnosis not present

## 2017-01-10 ENCOUNTER — Ambulatory Visit: Payer: Medicare Other

## 2017-01-10 ENCOUNTER — Telehealth: Payer: Self-pay | Admitting: *Deleted

## 2017-01-10 ENCOUNTER — Ambulatory Visit
Admission: RE | Admit: 2017-01-10 | Discharge: 2017-01-10 | Disposition: A | Discharge status: Home / Self Care | Payer: Medicare Other | Source: Ambulatory Visit | Attending: Radiation Oncology | Admitting: Radiation Oncology

## 2017-01-10 ENCOUNTER — Ambulatory Visit (HOSPITAL_BASED_OUTPATIENT_CLINIC_OR_DEPARTMENT_OTHER): Payer: Medicare Other | Admitting: Medical

## 2017-01-10 ENCOUNTER — Ambulatory Visit (HOSPITAL_BASED_OUTPATIENT_CLINIC_OR_DEPARTMENT_OTHER): Payer: Medicare Other

## 2017-01-10 VITALS — BP 130/70 | HR 99 | Temp 98.2°F | Resp 18 | Ht 65.0 in

## 2017-01-10 DIAGNOSIS — K209 Esophagitis, unspecified without bleeding: Secondary | ICD-10-CM

## 2017-01-10 DIAGNOSIS — C3411 Malignant neoplasm of upper lobe, right bronchus or lung: Secondary | ICD-10-CM | POA: Diagnosis not present

## 2017-01-10 DIAGNOSIS — E860 Dehydration: Secondary | ICD-10-CM

## 2017-01-10 DIAGNOSIS — R112 Nausea with vomiting, unspecified: Secondary | ICD-10-CM | POA: Diagnosis not present

## 2017-01-10 DIAGNOSIS — T451X5A Adverse effect of antineoplastic and immunosuppressive drugs, initial encounter: Secondary | ICD-10-CM

## 2017-01-10 DIAGNOSIS — D701 Agranulocytosis secondary to cancer chemotherapy: Secondary | ICD-10-CM

## 2017-01-10 DIAGNOSIS — Z510 Encounter for antineoplastic radiation therapy: Secondary | ICD-10-CM | POA: Diagnosis not present

## 2017-01-10 LAB — CBC WITH DIFFERENTIAL/PLATELET
BASO%: 0 % (ref 0.0–2.0)
BASOS ABS: 0 10*3/uL (ref 0.0–0.1)
EOS ABS: 0 10*3/uL (ref 0.0–0.5)
EOS%: 0 % (ref 0.0–7.0)
HEMATOCRIT: 27 % — AB (ref 34.8–46.6)
HGB: 9.1 g/dL — ABNORMAL LOW (ref 11.6–15.9)
LYMPH#: 0.3 10*3/uL — AB (ref 0.9–3.3)
LYMPH%: 30.2 % (ref 14.0–49.7)
MCH: 30.2 pg (ref 25.1–34.0)
MCHC: 33.7 g/dL (ref 31.5–36.0)
MCV: 89.7 fL (ref 79.5–101.0)
MONO#: 0.3 10*3/uL (ref 0.1–0.9)
MONO%: 38.4 % — ABNORMAL HIGH (ref 0.0–14.0)
NEUT#: 0.3 10*3/uL — CL (ref 1.5–6.5)
NEUT%: 31.4 % — AB (ref 38.4–76.8)
PLATELETS: 26 10*3/uL — AB (ref 145–400)
RBC: 3.01 10*6/uL — ABNORMAL LOW (ref 3.70–5.45)
RDW: 15.2 % — AB (ref 11.2–14.5)
WBC: 0.9 10*3/uL — AB (ref 3.9–10.3)

## 2017-01-10 LAB — COMPREHENSIVE METABOLIC PANEL
ALT: 11 U/L (ref 0–55)
ANION GAP: 9 meq/L (ref 3–11)
AST: 10 U/L (ref 5–34)
Albumin: 3.5 g/dL (ref 3.5–5.0)
Alkaline Phosphatase: 77 U/L (ref 40–150)
BUN: 27.6 mg/dL — ABNORMAL HIGH (ref 7.0–26.0)
CALCIUM: 9.7 mg/dL (ref 8.4–10.4)
CHLORIDE: 105 meq/L (ref 98–109)
CO2: 20 mEq/L — ABNORMAL LOW (ref 22–29)
CREATININE: 1.4 mg/dL — AB (ref 0.6–1.1)
EGFR: 37 mL/min/{1.73_m2} — ABNORMAL LOW (ref >60)
Glucose: 103 mg/dl (ref 70–140)
Potassium: 4.9 mEq/L (ref 3.5–5.1)
Sodium: 135 mEq/L — ABNORMAL LOW (ref 136–145)
Total Bilirubin: 0.34 mg/dL (ref 0.20–1.20)
Total Protein: 6.7 g/dL (ref 6.4–8.3)

## 2017-01-10 LAB — MAGNESIUM: MAGNESIUM: 1.9 mg/dL (ref 1.5–2.5)

## 2017-01-10 MED ORDER — SODIUM CHLORIDE 0.9 % IV SOLN
Freq: Once | INTRAVENOUS | Status: AC
  Administered 2017-01-10: 13:00:00 via INTRAVENOUS

## 2017-01-10 MED ORDER — ONDANSETRON HCL 8 MG PO TABS: 8.0000 mg | ORAL_TABLET | Freq: Three times a day (TID) | ORAL | 0 refills | Status: DC | PRN

## 2017-01-10 MED ORDER — LIDOCAINE VISCOUS 2 % MT SOLN: OROMUCOSAL | 3 refills | Status: DC

## 2017-01-10 MED ORDER — LORAZEPAM 0.5 MG PO TABS: 0.5000 mg | ORAL_TABLET | Freq: Four times a day (QID) | ORAL | 0 refills | Status: DC | PRN

## 2017-01-10 MED ORDER — OXYCODONE-ACETAMINOPHEN 5-325 MG PO TABS: 1.0000 | ORAL_TABLET | ORAL | 0 refills | Status: DC | PRN

## 2017-01-10 NOTE — Progress Notes (Signed)
Patient requesting to be evaluated by an MD because she isn't feeling well. Patient proves to be orthostatic. Six pound weight loss noted since 12/10/16. Reports poor appetite. Reports pain and difficulty associated with swallowing despite Carafate. Reports a dry cough. Reports nausea and vomiting despite Compazine. Received chemotherapy on 01/02/2017. Denies feeling lightheaded or dizzy. States, "I feel so weak I can hardly put one foot in front of the other."   BP (!) 81/42 (BP Location: Left Arm, Patient Position: Standing, Cuff Size: Normal)   Pulse (!) 115   Temp 97.8 F (36.6 C) (Oral)   Resp 16   Wt 136 lb (61.7 kg)   SpO2 100%   BMI 22.63 kg/m  Wt Readings from Last 3 Encounters:  01/10/17 136 lb (61.7 kg)  12/31/16 142 lb 4.8 oz (64.5 kg)  12/10/16 142 lb 3.2 oz (64.5 kg)

## 2017-01-10 NOTE — Patient Instructions (Signed)
Dehydration, Adult Dehydration is when there is not enough fluid or water in your body. This happens when you lose more fluids than you take in. Dehydration can range from mild to very bad. It should be treated right away to keep it from getting very bad. Symptoms of mild dehydration may include:  Thirst.  Dry lips.  Slightly dry mouth.  Dry, warm skin.  Dizziness. Symptoms of moderate dehydration may include:  Very dry mouth.  Muscle cramps.  Dark pee (urine). Pee may be the color of tea.  Your body making less pee.  Your eyes making fewer tears.  Heartbeat that is uneven or faster than normal (palpitations).  Headache.  Light-headedness, especially when you stand up from sitting.  Fainting (syncope). Symptoms of very bad dehydration may include:  Changes in skin, such as: ? Cold and clammy skin. ? Blotchy (mottled) or pale skin. ? Skin that does not quickly return to normal after being lightly pinched and let go (poor skin turgor).  Changes in body fluids, such as: ? Feeling very thirsty. ? Your eyes making fewer tears. ? Not sweating when body temperature is high, such as in hot weather. ? Your body making very little pee.  Changes in vital signs, such as: ? Weak pulse. ? Pulse that is more than 100 beats a minute when you are sitting still. ? Fast breathing. ? Low blood pressure.  Other changes, such as: ? Sunken eyes. ? Cold hands and feet. ? Confusion. ? Lack of energy (lethargy). ? Trouble waking up from sleep. ? Short-term weight loss. ? Unconsciousness. Follow these instructions at home:  If told by your doctor, drink an ORS: ? Make an ORS by using instructions on the package. ? Start by drinking small amounts, about  cup (120 mL) every 5-10 minutes. ? Slowly drink more until you have had the amount that your doctor said to have.  Drink enough clear fluid to keep your pee clear or pale yellow. If you were told to drink an ORS, finish the ORS  first, then start slowly drinking clear fluids. Drink fluids such as: ? Water. Do not drink only water by itself. Doing that can make the salt (sodium) level in your body get too low (hyponatremia). ? Ice chips. ? Fruit juice that you have added water to (diluted). ? Low-calorie sports drinks.  Avoid: ? Alcohol. ? Drinks that have a lot of sugar. These include high-calorie sports drinks, fruit juice that does not have water added, and soda. ? Caffeine. ? Foods that are greasy or have a lot of fat or sugar.  Take over-the-counter and prescription medicines only as told by your doctor.  Do not take salt tablets. Doing that can make the salt level in your body get too high (hypernatremia).  Eat foods that have minerals (electrolytes). Examples include bananas, oranges, potatoes, tomatoes, and spinach.  Keep all follow-up visits as told by your doctor. This is important. Contact a doctor if:  You have belly (abdominal) pain that: ? Gets worse. ? Stays in one area (localizes).  You have a rash.  You have a stiff neck.  You get angry or annoyed more easily than normal (irritability).  You are more sleepy than normal.  You have a harder time waking up than normal.  You feel: ? Weak. ? Dizzy. ? Very thirsty.  You have peed (urinated) only a small amount of very dark pee during 6-8 hours. Get help right away if:  You have symptoms of   very bad dehydration.  You cannot drink fluids without throwing up (vomiting).  Your symptoms get worse with treatment.  You have a fever.  You have a very bad headache.  You are throwing up or having watery poop (diarrhea) and it: ? Gets worse. ? Does not go away.  You have blood or something green (bile) in your throw-up.  You have blood in your poop (stool). This may cause poop to look black and tarry.  You have not peed in 6-8 hours.  You pass out (faint).  Your heart rate when you are sitting still is more than 100 beats a  minute.  You have trouble breathing. This information is not intended to replace advice given to you by your health care provider. Make sure you discuss any questions you have with your health care provider. Document Released: 10/27/2008 Document Revised: 07/21/2015 Document Reviewed: 02/24/2015 Elsevier Interactive Patient Education  2018 [employer]  

## 2017-01-10 NOTE — Progress Notes (Signed)
Symptoms Management Clinic Progress Note   Robyn Barton [id number] 01-Sep-1945 71 y.o.  Robyn Barton is managed by Dr. Eilleen Kempf  Actively treated with chemotherapy: yes  Current Therapy: Cisplatin and etoposide with concurrent radiation  Last Treated: 01/10/2017  Assessment: Plan:    Dehydration - Plan: 0.9 %  sodium chloride infusion  Nausea and vomiting, intractability of vomiting not specified, unspecified vomiting type - Plan: LORazepam (ATIVAN) 0.5 MG tablet  Esophagitis - Plan: lidocaine (XYLOCAINE) 2 % solution  Chemotherapy-induced neutropenia (HCC)   Dehydration: The patient was given 1 L of normal saline IV today.  Nausea and vomiting: The patient was given a prescription for Ativan 0.5 mg sublingual as needed nausea and vomiting.  Esophagitis.  The patient was instructed to continue using her sucralfate as ordered.  Additionally she was given a prescription for viscous lidocaine to use with her sucralfate.  Chemotherapy-induced neutropenia:  Neutropenic precautions were reviewed with the patient.  She was told to contact the on-call provider or present to the emergency room immediately for one fever of 100.5 or greater or for multiple fevers of 100.4.  Please see After Visit Summary for patient specific instructions.  Future Appointments  Date Time Provider [place]  01/13/2017 11:15 AM [facility] LINAC 1 [facility] None  01/15/2017  2:00 PM [facility] LINAC 1 [facility] None  01/16/2017 12:00 PM [facility] LINAC 1 [facility] None  01/31/2017  9:30 AM [facility] LAB 5 [facility] None  02/04/2017  9:15 AM [name], MD [facility] None    No orders of the defined types were placed in this encounter.     Subjective:   Patient ID:  Robyn Barton is a 70 y.o. (DOB [birth year]) female.  Chief Complaint: No chief complaint on file.   HPI [name] is a 71 year old female with a diagnosis of a recurrent small cell lung cancer  initially diagnosed as a limited stage lung cancer (T1a, N0, M0) small cell lung cancer diagnosed in June 2018.  She was initially treated with a left upper lobectomy.  The patient had disease recurrence with hypermetabolic subcarinal lymphadenopathy that was biopsy-proven to be a small cell carcinoma.  The patient was treated with cis-platinum and etoposide chemotherapy with concurrent radiation.  Her last infusion of chemotherapy was dosed on 12/31/2016 and her most recent radiation treatment was given today.  She was seen by Dr. Glendora Score and was given a prescription for Percocet for esophagitis.  The patient reported that she took her medications this morning without any food.  She then developed nausea and vomiting which she reported while being in radiation therapy.  She presents to the clinic for evaluation.  She was found to be orthostatic with her blood pressure dropping to 81/42 while in radiation therapy.  Labs are completed that showed a WBC of 0.9 and an ANC of 0.3.  The patient received a Neulasta injection on 01/02/2017.  She denies fevers, chills, or sweats.  Medications: I have reviewed the patient's current medications.  Allergies: No Known Allergies  Past Medical History:  Diagnosis Date  . Allergic rhinitis   . Anxiety   . Chest pain   . Elevated TSH    pt denies  . Emphysema lung ([place])   . Enlarged lymph nodes    in chest  . GERD (gastroesophageal reflux disease)   . Goals of care, counseling/discussion 10/21/2016  . History of hiatal hernia   . Hyperlipidemia   . Hypoxemia   . IFG (impaired  fasting glucose)   . Osteopenia   . Pneumonia    several  times last 2009  . Pulmonary nodule   . Recurrent major depressive disorder ([place])   . Sleep apnea    mild no cpap needed  . Small cell carcinoma of right lung ([place]) 2018   surgery right middle lobe removed   . Tobacco dependence   . Varicose veins of both lower extremities   . Vitamin D deficiency     Past Surgical  History:  Procedure Laterality Date  . ABDOMINAL HYSTERECTOMY     1 ovary left  . ENDOBRONCHIAL ULTRASOUND Bilateral 10/15/2016   Procedure: ENDOBRONCHIAL ULTRASOUND;  Surgeon: Collene Gobble, MD;  Location: [facility];  Service: Cardiopulmonary;  Laterality: Bilateral;  . INCONTINENCE SURGERY    . right middle lobe lung surgery  06/26/2016   [facility]    Family History  Problem Relation Age of Onset  . Cancer Neg Hx     Social History   Socioeconomic History  . Marital status: Divorced    Spouse name: Not on file  . Number of children: Not on file  . Years of education: Not on file  . Highest education level: Not on file  Social Needs  . Financial resource strain: Not on file  . Food insecurity - worry: Not on file  . Food insecurity - inability: Not on file  . Transportation needs - medical: Not on file  . Transportation needs - non-medical: Not on file  Occupational History  . Not on file  Tobacco Use  . Smoking status: Former Smoker    Packs/day: 1.50    Years: 50.00    Pack years: 75.00    Types: Cigarettes    Last attempt to quit: 06/14/2016    Years since quitting: 0.5  . Smokeless tobacco: Never Used  Substance and Sexual Activity  . Alcohol use: Yes    Comment: rare  . Drug use: No  . Sexual activity: No  Other Topics Concern  . Not on file  Social History Narrative  . Not on file    Past Medical History, Surgical history, Social history, and Family history were reviewed and updated as appropriate.   Please see review of systems for further details on the patient's review from today.   Review of Systems:  Review of Systems  Constitutional: Positive for appetite change. Negative for chills, diaphoresis and fever.  HENT: Positive for trouble swallowing.   Respiratory: Negative for cough, choking, chest tightness and shortness of breath.   Cardiovascular: Negative for chest pain and palpitations.  Gastrointestinal: Positive for nausea and vomiting.  Negative for constipation and diarrhea.    Objective:   Physical Exam:  BP 130/70 (BP Location: Right Arm, Patient Position: Sitting)   Pulse 99   Temp 98.2 F (36.8 C) (Oral)   Resp 18   Ht 5\' 5"  (1.651 m)   SpO2 100% Comment: RA  BMI 22.63 kg/m  ECOG: 1  Physical Exam  Constitutional: No distress.  HENT:  Head: Normocephalic and atraumatic.  Mouth/Throat: No oropharyngeal exudate.  The patient's mucous membranes appear dry.  Eyes: Right eye exhibits no discharge. Left eye exhibits no discharge. No scleral icterus.  Neck: Normal range of motion. Neck supple.  Cardiovascular: Normal rate, regular rhythm and normal heart sounds. Exam reveals no gallop and no friction rub.  No murmur heard. Pulmonary/Chest: Effort normal and breath sounds normal. No respiratory distress. She has no wheezes. She has no rales.  Musculoskeletal: She exhibits no edema.  Lymphadenopathy:    She has no cervical adenopathy.  Neurological: She is alert. Coordination normal.  Skin: Skin is warm and dry. She is not diaphoretic.    Lab Review:     Component Value Date/Time   NA 135 (L) 01/10/2017 1045   K 4.9 01/10/2017 1045   CL 106 11/26/2016 0346   CO2 20 (L) 01/10/2017 1045   GLUCOSE 103 01/10/2017 1045   BUN 27.6 (H) 01/10/2017 1045   CREATININE 1.4 (H) 01/10/2017 1045   CALCIUM 9.7 01/10/2017 1045   PROT 6.7 01/10/2017 1045   ALBUMIN 3.5 01/10/2017 1045   AST 10 01/10/2017 1045   ALT 11 01/10/2017 1045   ALKPHOS 77 01/10/2017 1045   BILITOT 0.34 01/10/2017 1045   GFRNONAA 50 (L) 11/26/2016 0346   GFRAA 58 (L) 11/26/2016 0346       Component Value Date/Time   WBC 0.9 (LL) 01/10/2017 1045   WBC 13.0 (H) 11/26/2016 0346   RBC 3.01 (L) 01/10/2017 1045   RBC 3.26 (L) 11/26/2016 0346   HGB 9.1 (L) 01/10/2017 1045   HCT 27.0 (L) 01/10/2017 1045   PLT 26 (L) 01/10/2017 1045   MCV 89.7 01/10/2017 1045   MCH 30.2 01/10/2017 1045   MCH 29.1 11/26/2016 0346   MCHC 33.7 01/10/2017 1045    MCHC 33.2 11/26/2016 0346   RDW 15.2 (H) 01/10/2017 1045   LYMPHSABS 0.3 (L) 01/10/2017 1045   MONOABS 0.3 01/10/2017 1045   EOSABS 0.0 01/10/2017 1045   BASOSABS 0.0 01/10/2017 1045   -------------------------------  Imaging from last 24 hours (if applicable):  Radiology interpretation: Ct Angio Chest W/cm &/or Wo Cm  Result Date: 12/16/2016 CLINICAL DATA:  Shortness of breath. Followup of small-cell lung cancer. Gastroesophageal reflux disease. Ex-smoker. Hyperlipidemia. Status post right middle lobectomy 06/26/2016. EXAM: CT ANGIOGRAPHY CHEST WITH CONTRAST TECHNIQUE: Multidetector CT imaging of the chest was performed using the standard protocol during bolus administration of intravenous contrast. Multiplanar CT image reconstructions and MIPs were obtained to evaluate the vascular anatomy. CONTRAST:  145mL ISOVUE-370 IOPAMIDOL (ISOVUE-370) INJECTION 76% COMPARISON:  11/15/2016 CT.  09/26/2016 PET. FINDINGS: Cardiovascular: The quality of this exam for evaluation of pulmonary embolism is good. No evidence of pulmonary embolism. Aortic and branch vessel atherosclerosis. Tortuous thoracic aorta. Heart size upper normal, without pericardial effusion. Mediastinum/Nodes: Subcarinal node is further decreased in size, 5 mm on image 37/series 4. 10 mm on the most recent CT. No hilar adenopathy. Tiny hiatal hernia. Lungs/Pleura: No pleural fluid. Status post right middle lobectomy. Mild to moderate centrilobular emphysema. Anterior right upper lobe scarring. Upper Abdomen: Normal imaged portions of the liver, spleen, pancreas, kidneys, right adrenal gland. Mild left adrenal thickening is similar. Musculoskeletal: No acute osseous abnormality. Convex left thoracic spine curvature Review of the MIP images confirms the above findings. IMPRESSION: 1.  No evidence of pulmonary embolism. 2. Status post right middle lobectomy. Further decrease in size of subcarinal lymph node. No evidence of recurrent or  metastatic disease. 3. Tiny hiatal hernia. 4.  Aortic Atherosclerosis (ICD10-I70.0). 5.  Emphysema (ICD10-J43.9). Electronically Signed   By: Abigail Miyamoto M.D.   On: 12/16/2016 11:06

## 2017-01-10 NOTE — Telephone Encounter (Signed)
"  I don't feel well.  My B/P is low in  The 80's.  Throwing up, dehydrated.  I need help.  Last time I vomited was at 9:00 am."   Will notify providers, return call with orders or instructions.   "I'm in the lobby.  Have been waiting and can hardley sit in this wheelchair.  Can't I just go to the treatment room if Dr. Lisbeth Renshaw recommended IV fluids.  Why does someone else have to see me to tell me what I already know?  I arrived a long time ago.  This process makes no sense.  An exception needs to be made.  I don't feel well.  I'm out here drinking water while I wait" Per [facility] Nurse, patient will come to clinic shortly.   Notified patient of this at 11:49.  "They're here now.  Please call infusion room to let them know I'm on my way."

## 2017-01-10 NOTE — Telephone Encounter (Signed)
Call from [place] radiation regarding pt status. Pt to be further evaluated [facility] and labs for further evaluation of status. Message to scheduling. MD notified.

## 2017-01-10 NOTE — Progress Notes (Signed)
Discussed patient's case with Armanda Magic, RN for Dr. [name]. She will enter lab orders. She requested patient be wheeled to lobby to register for labs then to see [employer]. Wheeled patient to lobby as requested.

## 2017-01-13 ENCOUNTER — Ambulatory Visit: Payer: Medicare Other

## 2017-01-13 ENCOUNTER — Ambulatory Visit
Admission: RE | Admit: 2017-01-13 | Discharge: 2017-01-13 | Disposition: A | Discharge status: Home / Self Care | Payer: Medicare Other | Source: Ambulatory Visit | Attending: Radiation Oncology | Admitting: Radiation Oncology

## 2017-01-13 DIAGNOSIS — Z510 Encounter for antineoplastic radiation therapy: Secondary | ICD-10-CM | POA: Diagnosis not present

## 2017-01-14 ENCOUNTER — Ambulatory Visit: Payer: Medicare Other

## 2017-01-14 DIAGNOSIS — C3411 Malignant neoplasm of upper lobe, right bronchus or lung: Secondary | ICD-10-CM | POA: Diagnosis present

## 2017-01-14 DIAGNOSIS — K219 Gastro-esophageal reflux disease without esophagitis: Secondary | ICD-10-CM | POA: Diagnosis not present

## 2017-01-14 DIAGNOSIS — E559 Vitamin D deficiency, unspecified: Secondary | ICD-10-CM | POA: Diagnosis not present

## 2017-01-14 DIAGNOSIS — Z510 Encounter for antineoplastic radiation therapy: Secondary | ICD-10-CM | POA: Diagnosis present

## 2017-01-14 DIAGNOSIS — J439 Emphysema, unspecified: Secondary | ICD-10-CM | POA: Diagnosis not present

## 2017-01-14 DIAGNOSIS — F339 Major depressive disorder, recurrent, unspecified: Secondary | ICD-10-CM | POA: Diagnosis not present

## 2017-01-14 DIAGNOSIS — Z87891 Personal history of nicotine dependence: Secondary | ICD-10-CM | POA: Diagnosis not present

## 2017-01-14 DIAGNOSIS — E785 Hyperlipidemia, unspecified: Secondary | ICD-10-CM | POA: Diagnosis not present

## 2017-01-15 ENCOUNTER — Ambulatory Visit: Payer: Medicare Other

## 2017-01-15 ENCOUNTER — Ambulatory Visit
Admission: RE | Admit: 2017-01-15 | Discharge: 2017-01-15 | Disposition: A | Discharge status: Home / Self Care | Payer: Medicare Other | Source: Ambulatory Visit | Attending: Radiation Oncology | Admitting: Radiation Oncology

## 2017-01-15 DIAGNOSIS — Z510 Encounter for antineoplastic radiation therapy: Secondary | ICD-10-CM | POA: Diagnosis not present

## 2017-01-16 ENCOUNTER — Ambulatory Visit
Admission: RE | Admit: 2017-01-16 | Discharge: 2017-01-16 | Disposition: A | Discharge status: Home / Self Care | Payer: Medicare Other | Source: Ambulatory Visit | Attending: Radiation Oncology | Admitting: Radiation Oncology

## 2017-01-16 ENCOUNTER — Ambulatory Visit: Payer: Medicare Other

## 2017-01-16 ENCOUNTER — Encounter: Payer: Self-pay | Admitting: Radiation Oncology

## 2017-01-16 DIAGNOSIS — Z510 Encounter for antineoplastic radiation therapy: Secondary | ICD-10-CM | POA: Diagnosis not present

## 2017-01-16 DIAGNOSIS — C3411 Malignant neoplasm of upper lobe, right bronchus or lung: Secondary | ICD-10-CM

## 2017-01-16 MED ORDER — RADIAPLEXRX EX GEL
Freq: Two times a day (BID) | CUTANEOUS | Status: DC
  Administered 2017-01-16: 16:00:00 via TOPICAL

## 2017-01-16 NOTE — Progress Notes (Signed)
  Radiation Oncology         [phone]) [phone] ________________________________  Name: Robyn Barton MRN: [id number]  Date: 01/16/2017  DOB: 1945/05/08  End of Treatment Note  Diagnosis: 72 y.o. woman with recurrent small cell lung cancer initially diagnosed as Limited stage (T1a, N0, M0) small cell lung cancer in June 2018  Indication for treatment:  Curative       Radiation treatment dates: 11/18/16-01/16/17  Site/dose: Lung/ 59.4 Gy in 33 fractions  Beams/energy:   3D/ 6X  Narrative: The patient tolerated radiation treatment relatively well. During treatment the patient complained of occasional difficulty swallowing and shortness of breath with activity. She denied coughing or pain.  Plan: The patient has completed radiation treatment. The patient will return to radiation oncology clinic for routine followup in one month. I advised her to call or return sooner if she has any questions or concerns related to her recovery or treatment. ________________________________  Sheral Apley. Tammi Klippel, M.D.   This document serves as a record of services personally performed by Tyler Pita, MD. It was created on his behalf by Bethann Humble, a trained medical scribe. The creation of this record is based on the scribe's personal observations and the provider's statements to them. This document has been checked and approved by the attending provider.

## 2017-01-17 ENCOUNTER — Ambulatory Visit: Payer: Medicare Other

## 2017-01-27 ENCOUNTER — Telehealth: Payer: Self-pay | Admitting: Medical Oncology

## 2017-01-27 ENCOUNTER — Inpatient Hospital Stay (HOSPITAL_BASED_OUTPATIENT_CLINIC_OR_DEPARTMENT_OTHER): Payer: Medicare Other | Admitting: Medical

## 2017-01-27 ENCOUNTER — Inpatient Hospital Stay: Payer: Medicare Other | Attending: Internal Medicine

## 2017-01-27 VITALS — BP 133/66 | HR 109 | Temp 97.7°F | Resp 18 | Ht 65.0 in | Wt 138.5 lb

## 2017-01-27 DIAGNOSIS — R3911 Hesitancy of micturition: Secondary | ICD-10-CM

## 2017-01-27 DIAGNOSIS — R319 Hematuria, unspecified: Secondary | ICD-10-CM

## 2017-01-27 DIAGNOSIS — N390 Urinary tract infection, site not specified: Secondary | ICD-10-CM | POA: Insufficient documentation

## 2017-01-27 DIAGNOSIS — C3490 Malignant neoplasm of unspecified part of unspecified bronchus or lung: Secondary | ICD-10-CM

## 2017-01-27 DIAGNOSIS — Z9221 Personal history of antineoplastic chemotherapy: Secondary | ICD-10-CM | POA: Diagnosis not present

## 2017-01-27 DIAGNOSIS — R8290 Unspecified abnormal findings in urine: Secondary | ICD-10-CM

## 2017-01-27 DIAGNOSIS — Z923 Personal history of irradiation: Secondary | ICD-10-CM | POA: Insufficient documentation

## 2017-01-27 DIAGNOSIS — Z85118 Personal history of other malignant neoplasm of bronchus and lung: Secondary | ICD-10-CM | POA: Diagnosis present

## 2017-01-27 LAB — URINALYSIS, COMPLETE (UACMP) WITH MICROSCOPIC
Bilirubin Urine: NEGATIVE
GLUCOSE, UA: NEGATIVE mg/dL
Ketones, ur: NEGATIVE mg/dL
NITRITE: NEGATIVE
PH: 6 (ref 5.0–8.0)
Protein, ur: NEGATIVE mg/dL
SPECIFIC GRAVITY, URINE: 1.014 (ref 1.005–1.030)

## 2017-01-27 LAB — COMPREHENSIVE METABOLIC PANEL
ALK PHOS: 66 U/L (ref 40–150)
ALT: 9 U/L (ref 0–55)
AST: 13 U/L (ref 5–34)
Albumin: 3.3 g/dL — ABNORMAL LOW (ref 3.5–5.0)
Anion gap: 11 (ref 3–11)
BUN: 26 mg/dL (ref 7–26)
CALCIUM: 9.2 mg/dL (ref 8.4–10.4)
CO2: 22 mmol/L (ref 22–29)
CREATININE: 1.19 mg/dL — AB (ref 0.60–1.10)
Chloride: 106 mmol/L (ref 98–109)
GFR calc non Af Amer: 44 mL/min — ABNORMAL LOW (ref >60)
GFR, EST AFRICAN AMERICAN: 51 mL/min — AB (ref >60)
GLUCOSE: 125 mg/dL (ref 70–140)
Potassium: 4.3 mmol/L (ref 3.3–4.7)
SODIUM: 139 mmol/L (ref 136–145)
Total Bilirubin: 0.2 mg/dL (ref 0.2–1.2)
Total Protein: 7 g/dL (ref 6.4–8.3)

## 2017-01-27 LAB — CBC WITH DIFFERENTIAL/PLATELET
BASOS PCT: 4 %
Basophils Absolute: 0.2 10*3/uL — ABNORMAL HIGH (ref 0.0–0.1)
EOS ABS: 0 10*3/uL (ref 0.0–0.5)
Eosinophils Relative: 1 %
HCT: 27.6 % — ABNORMAL LOW (ref 34.8–46.6)
Hemoglobin: 9 g/dL — ABNORMAL LOW (ref 11.6–15.9)
Lymphocytes Relative: 11 %
Lymphs Abs: 0.6 10*3/uL — ABNORMAL LOW (ref 0.9–3.3)
MCH: 30 pg (ref 25.1–34.0)
MCHC: 32.6 g/dL (ref 31.5–36.0)
MCV: 92 fL (ref 79.5–101.0)
MONO ABS: 1 10*3/uL — AB (ref 0.1–0.9)
Monocytes Relative: 19 %
NEUTROS PCT: 65 %
Neutro Abs: 3.4 10*3/uL (ref 1.5–6.5)
Platelets: 277 10*3/uL (ref 145–400)
RBC: 3.01 MIL/uL — ABNORMAL LOW (ref 3.70–5.45)
RDW: 18.8 % — AB (ref 11.2–16.1)
WBC: 5.2 10*3/uL (ref 3.9–10.3)

## 2017-01-27 LAB — MAGNESIUM: Magnesium: 1.9 mg/dL (ref 1.5–2.5)

## 2017-01-27 MED ORDER — CIPROFLOXACIN HCL 250 MG PO TABS: 250.0000 mg | ORAL_TABLET | Freq: Two times a day (BID) | ORAL | 0 refills | Status: DC

## 2017-01-27 NOTE — Telephone Encounter (Signed)
Urinary symptoms , abdominal cramping and pain x 4 days. LBM this am "normal". " I just lay around all day and night " appt for lab and smc today -pt notified.

## 2017-01-28 NOTE — Progress Notes (Signed)
Symptoms Management Clinic Progress Note   Robyn Barton [id number] [birth year] 72 y.o.  Robyn Barton is managed by Dr. Eilleen Kempf  Actively treated with chemotherapy: yes  Current Therapy: Cisplatin and etoposide with Neulasta support  Last Treated: 12/31/2016 (cycle 4, day 1-3)  Assessment: Plan:    Urinary tract infection with hematuria, site unspecified - Plan: ciprofloxacin (CIPRO) 250 MG tablet   UTI: The patient was given a prescription for ciprofloxacin 250 mg p.o. twice daily  Please see After Visit Summary for patient specific instructions.  Future Appointments  Date Time Provider [place]  01/31/2017 10:00 AM [facility] LAB 2 [facility] None  01/31/2017 11:00 AM [facility] [place]  02/04/2017  9:15 AM [name], MD [facility] None  02/07/2017 10:45 AM Lamonte Sakai, Rose Fillers, MD [facility] None  02/19/2017  2:30 PM Bruning, Ashlyn, PA-C [facility] None  05/14/2017  9:00 AM Copland, Gay Filler, MD [facility]    No orders of the defined types were placed in this encounter.      Subjective:   Patient ID:  Robyn Barton is a 72 y.o. (DOB 1945/01/25) female.  Chief Complaint:  Chief Complaint  Patient presents with  . Fatigue    HPI Robyn Barton is a 72 year old female with a diagnosis of a recurrent small cell lung cancer initially diagnosed as a limited stage lung cancer (T1a, N0, M0) small cell lung cancer diagnosed in June 2018.  She was initially treated with a left upper lobectomy.  The patient had disease recurrence with hypermetabolic subcarinal lymphadenopathy that was biopsy-proven to be a small cell carcinoma.  The patient was treated with cis-platinum and etoposide chemotherapy with concurrent radiation.  Her last infusion of chemotherapy was dosed on 12/31/2016 and her last radiation treatment was given on 01/16/2017. She was last seen in the symptom management clinic for dehydration on 01/02/2017.  She contact her office this  morning stating that she was having urinary hesitancy and a foul odor to her urine along with abdominal cramping for the last 4 days.  Her last bowel movement was this morning and was normal.  She denies dysuria, fevers, chills, sweats, nausea, vomiting, or diarrhea.  Medications: I have reviewed the patient's current medications.  Allergies: No Known Allergies  Past Medical History:  Diagnosis Date  . Allergic rhinitis   . Anxiety   . Chest pain   . Elevated TSH    pt denies  . Emphysema lung ([place])   . Enlarged lymph nodes    in chest  . GERD (gastroesophageal reflux disease)   . Goals of care, counseling/discussion 10/21/2016  . History of hiatal hernia   . Hyperlipidemia   . Hypoxemia   . IFG (impaired fasting glucose)   . Osteopenia   . Pneumonia    several  times last 2009  . Pulmonary nodule   . Recurrent major depressive disorder ([place])   . Sleep apnea    mild no cpap needed  . Small cell carcinoma of right lung ([place]) 2018   surgery right middle lobe removed   . Tobacco dependence   . Varicose veins of both lower extremities   . Vitamin D deficiency     Past Surgical History:  Procedure Laterality Date  . ABDOMINAL HYSTERECTOMY     1 ovary left  . ENDOBRONCHIAL ULTRASOUND Bilateral 10/15/2016   Procedure: ENDOBRONCHIAL ULTRASOUND;  Surgeon: Collene Gobble, MD;  [facility];  Service: Cardiopulmonary;  Laterality: Bilateral;  .  INCONTINENCE SURGERY    . right middle lobe lung surgery  06/26/2016   [facility]    Family History  Problem Relation Age of Onset  . Cancer Neg Hx     Social History   Socioeconomic History  . Marital status: Divorced    Spouse name: Not on file  . Number of children: Not on file  . Years of education: Not on file  . Highest education level: Not on file  Social Needs  . Financial resource strain: Not on file  . Food insecurity - worry: Not on file  . Food insecurity - inability: Not on file  . Transportation needs -  medical: Not on file  . Transportation needs - non-medical: Not on file  Occupational History  . Not on file  Tobacco Use  . Smoking status: Former Smoker    Packs/day: 1.50    Years: 50.00    Pack years: 75.00    Types: Cigarettes    Last attempt to quit: 06/14/2016    Years since quitting: 0.6  . Smokeless tobacco: Never Used  Substance and Sexual Activity  . Alcohol use: Yes    Comment: rare  . Drug use: No  . Sexual activity: No  Other Topics Concern  . Not on file  Social History Narrative  . Not on file    Past Medical History, Surgical history, Social history, and Family history were reviewed and updated as appropriate.   Please see review of systems for further details on the patient's review from today.   Review of Systems:  Review of Systems  Constitutional: Negative for chills, diaphoresis and fever.  Genitourinary: Positive for frequency. Negative for decreased urine volume, difficulty urinating, dysuria, flank pain, hematuria and urgency.       Foul-smelling urine    Objective:   Physical Exam:  BP 133/66 (BP Location: Left Arm, Patient Position: Sitting)   Pulse (!) 109   Temp 97.7 F (36.5 C) (Oral)   Resp 18   Ht 5\' 5"  (1.651 m)   Wt 138 lb 8 oz (62.8 kg)   SpO2 99%   BMI 23.05 kg/m  ECOG: 0  Physical Exam  Constitutional: No distress.  HENT:  Head: Normocephalic and atraumatic.  Cardiovascular: Normal rate, regular rhythm and normal heart sounds. Exam reveals no gallop and no friction rub.  No murmur heard. Pulmonary/Chest: Effort normal and breath sounds normal. No respiratory distress. She has no wheezes. She has no rales.  Abdominal: Soft. Bowel sounds are normal. She exhibits no distension. There is no tenderness. There is no rebound and no guarding.  Musculoskeletal: She exhibits no edema.  Neurological: She is alert. Coordination normal.  Skin: Skin is warm and dry. She is not diaphoretic.  Psychiatric: She has a normal mood and  affect. Her behavior is normal. Judgment and thought content normal.    Lab Review:     Component Value Date/Time   NA 139 01/27/2017 1201   NA 135 (L) 01/10/2017 1045   K 4.3 01/27/2017 1201   K 4.9 01/10/2017 1045   CL 106 01/27/2017 1201   CO2 22 01/27/2017 1201   CO2 20 (L) 01/10/2017 1045   GLUCOSE 125 01/27/2017 1201   GLUCOSE 103 01/10/2017 1045   BUN 26 01/27/2017 1201   BUN 27.6 (H) 01/10/2017 1045   CREATININE 1.19 (H) 01/27/2017 1201   CREATININE 1.4 (H) 01/10/2017 1045   CALCIUM 9.2 01/27/2017 1201   CALCIUM 9.7 01/10/2017 1045   PROT 7.0  01/27/2017 1201   PROT 6.7 01/10/2017 1045   ALBUMIN 3.3 (L) 01/27/2017 1201   ALBUMIN 3.5 01/10/2017 1045   AST 13 01/27/2017 1201   AST 10 01/10/2017 1045   ALT 9 01/27/2017 1201   ALT 11 01/10/2017 1045   ALKPHOS 66 01/27/2017 1201   ALKPHOS 77 01/10/2017 1045   BILITOT 0.2 01/27/2017 1201   BILITOT 0.34 01/10/2017 1045   GFRNONAA 44 (L) 01/27/2017 1201   GFRAA 51 (L) 01/27/2017 1201       Component Value Date/Time   WBC 5.2 01/27/2017 1201   RBC 3.01 (L) 01/27/2017 1201   HGB 9.0 (L) 01/27/2017 1201   HGB 9.1 (L) 01/10/2017 1045   HCT 27.6 (L) 01/27/2017 1201   HCT 27.0 (L) 01/10/2017 1045   PLT 277 01/27/2017 1201   PLT 26 (L) 01/10/2017 1045   MCV 92.0 01/27/2017 1201   MCV 89.7 01/10/2017 1045   MCH 30.0 01/27/2017 1201   MCHC 32.6 01/27/2017 1201   RDW 18.8 (H) 01/27/2017 1201   RDW 15.2 (H) 01/10/2017 1045   LYMPHSABS 0.6 (L) 01/27/2017 1201   LYMPHSABS 0.3 (L) 01/10/2017 1045   MONOABS 1.0 (H) 01/27/2017 1201   MONOABS 0.3 01/10/2017 1045   EOSABS 0.0 01/27/2017 1201   EOSABS 0.0 01/10/2017 1045   BASOSABS 0.2 (H) 01/27/2017 1201   BASOSABS 0.0 01/10/2017 1045   -------------------------------  Imaging from last 24 hours (if applicable):  Radiology interpretation: No results found.

## 2017-01-31 ENCOUNTER — Ambulatory Visit (HOSPITAL_COMMUNITY)
Admission: RE | Admit: 2017-01-31 | Discharge: 2017-01-31 | Disposition: A | Discharge status: Home / Self Care | Payer: Medicare Other | Source: Ambulatory Visit | Attending: Internal Medicine | Admitting: Internal Medicine

## 2017-01-31 ENCOUNTER — Other Ambulatory Visit: Payer: Self-pay

## 2017-01-31 DIAGNOSIS — Z902 Acquired absence of lung [part of]: Secondary | ICD-10-CM | POA: Insufficient documentation

## 2017-01-31 DIAGNOSIS — I700 Atherosclerosis of aorta: Secondary | ICD-10-CM | POA: Insufficient documentation

## 2017-01-31 DIAGNOSIS — I2510 Atherosclerotic heart disease of native coronary artery without angina pectoris: Secondary | ICD-10-CM | POA: Insufficient documentation

## 2017-01-31 DIAGNOSIS — C3490 Malignant neoplasm of unspecified part of unspecified bronchus or lung: Secondary | ICD-10-CM | POA: Diagnosis present

## 2017-01-31 DIAGNOSIS — J432 Centrilobular emphysema: Secondary | ICD-10-CM | POA: Insufficient documentation

## 2017-01-31 MED ORDER — IOPAMIDOL (ISOVUE-300) INJECTION 61%
75.0000 mL | Freq: Once | INTRAVENOUS | Status: AC | PRN
  Administered 2017-01-31: 60 mL via INTRAVENOUS

## 2017-01-31 MED ORDER — IOPAMIDOL (ISOVUE-300) INJECTION 61%
INTRAVENOUS | Status: AC
  Filled 2017-01-31: qty 75

## 2017-02-04 ENCOUNTER — Inpatient Hospital Stay: Payer: Medicare Other | Admitting: Internal Medicine

## 2017-02-04 ENCOUNTER — Telehealth: Payer: Self-pay | Admitting: Internal Medicine

## 2017-02-04 ENCOUNTER — Encounter: Payer: Self-pay | Admitting: Internal Medicine

## 2017-02-04 VITALS — BP 141/69 | HR 100 | Temp 98.0°F | Resp 18 | Ht 65.0 in | Wt 138.9 lb

## 2017-02-04 DIAGNOSIS — R319 Hematuria, unspecified: Secondary | ICD-10-CM | POA: Diagnosis not present

## 2017-02-04 DIAGNOSIS — Z85118 Personal history of other malignant neoplasm of bronchus and lung: Secondary | ICD-10-CM | POA: Diagnosis not present

## 2017-02-04 DIAGNOSIS — N390 Urinary tract infection, site not specified: Secondary | ICD-10-CM

## 2017-02-04 DIAGNOSIS — Z923 Personal history of irradiation: Secondary | ICD-10-CM

## 2017-02-04 DIAGNOSIS — Z9221 Personal history of antineoplastic chemotherapy: Secondary | ICD-10-CM

## 2017-02-04 DIAGNOSIS — C3411 Malignant neoplasm of upper lobe, right bronchus or lung: Secondary | ICD-10-CM

## 2017-02-04 NOTE — Telephone Encounter (Signed)
Gave patient avs and calendar with appts per 1/22 los.

## 2017-02-04 NOTE — Progress Notes (Signed)
[place] [phone]   [phone]  OFFICE PROGRESS NOTE  [name], MD [address] [place] [address]  DIAGNOSIS: Recurrent small cell lung cancer initially diagnosed as Limited stage (T1a, N0, M0) small cell lung cancer diagnosed in June 2018.  PRIOR THERAPY:  1) right upper lobectomy on 06/26/2016 and the final pathology was consistent with small cell lung cancer measuring 1.6 cm in size. 2) systemic chemotherapy with cisplatin 60 MG/M2 on day 1 and etoposide 120 MG/M2 on days 1, 2 and 3 every 3 weeks.  Status post 4 cycles.  This was concurrent with radiation.  CURRENT THERAPY: Observation.  INTERVAL HISTORY: Robyn Barton 72 y.o. female returns to the clinic today for follow-up visit.  The patient is feeling fine today with no specific complaints.  She recovered from the last chemotherapy cycle.  She denied having any current chest pain, shortness of breath, cough or hemoptysis.  She denied having any fever or chills.  She has no nausea, vomiting, diarrhea or constipation.  She denied having any weight loss or night sweats.  She has no headache or visual changes.  The patient had repeat CT scan of the chest performed recently and she is here for evaluation and discussion of her scan results.  MEDICAL HISTORY: Past Medical History:  Diagnosis Date  . Allergic rhinitis   . Anxiety   . Chest pain   . Elevated TSH    pt denies  . Emphysema lung ([place])   . Enlarged lymph nodes    in chest  . GERD (gastroesophageal reflux disease)   . Goals of care, counseling/discussion 10/21/2016  . History of hiatal hernia   . Hyperlipidemia   . Hypoxemia   . IFG (impaired fasting glucose)   . Osteopenia   . Pneumonia    several  times last 2009  . Pulmonary nodule   . Recurrent major depressive disorder ([place])   . Sleep apnea    mild no cpap needed  . Small cell carcinoma of right lung ([place]) 2018   surgery right middle lobe  removed   . Tobacco dependence   . Varicose veins of both lower extremities   . Vitamin D deficiency     ALLERGIES:  has No Known Allergies.  MEDICATIONS:  Current Outpatient Medications  Medication Sig Dispense Refill  . albuterol (PROVENTIL HFA;VENTOLIN HFA) 108 (90 Base) MCG/ACT inhaler Inhale 2 puffs into the lungs every 6 (six) hours as needed for wheezing or shortness of breath.    Marland Kitchen alendronate (FOSAMAX) 70 MG tablet Take 70 mg by mouth every Monday. Take with a full glass of water on an empty stomach.     Marland Kitchen atorvastatin (LIPITOR) 40 MG tablet Take 40 mg by mouth at bedtime.     . calcium carbonate (TUMS - DOSED IN MG ELEMENTAL CALCIUM) 500 MG chewable tablet Chew 1 tablet daily as needed by mouth for indigestion or heartburn.    . Cholecalciferol (VITAMIN D) 2000 units tablet Take 2,000 Units by mouth daily.    . ciprofloxacin (CIPRO) 250 MG tablet Take 1 tablet (250 mg total) by mouth 2 (two) times daily. 14 tablet 0  . diphenhydramine-acetaminophen (TYLENOL PM) 25-500 MG TABS tablet Take 1 tablet at bedtime as needed by mouth (sleep).    Marland Kitchen lidocaine (XYLOCAINE) 2 % solution Mix 10 ml with 2 ounces of water and swallow before eating 100 mL 3  . loperamide (IMODIUM) 2 MG  capsule Take 2 mg as needed by mouth for diarrhea or loose stools.    Marland Kitchen loratadine (CLARITIN) 10 MG tablet Take 10 mg by mouth daily as needed (hay fever).    . LORazepam (ATIVAN) 0.5 MG tablet Take 1 tablet (0.5 mg total) by mouth every 6 (six) hours as needed (nausea). 30 tablet 0  . magnesium oxide (MAG-OX) 400 MG tablet Take 1 tablet by mouth 3 (three) times daily.  1  . omeprazole (PRILOSEC) 40 MG capsule Take 40 mg by mouth daily.   3  . ondansetron (ZOFRAN) 8 MG tablet Take 1 tablet (8 mg total) by mouth every 8 (eight) hours as needed for nausea or vomiting. (Patient not taking: Reported on 01/10/2017) 30 tablet 0  . oxyCODONE-acetaminophen (PERCOCET/ROXICET) 5-325 MG tablet Take 1 tablet by mouth every 4  (four) hours as needed for moderate pain or severe pain. (Patient not taking: Reported on 01/10/2017) 30 tablet 0  . prochlorperazine (COMPAZINE) 10 MG tablet TAKE 1 TABLET (10 MG TOTAL) BY MOUTH EVERY 6 (SIX) HOURS AS NEEDED FOR NAUSEA OR VOMITING. 30 tablet 0  . sertraline (ZOLOFT) 50 MG tablet Take 2.5 tablets (125 mg total) daily by mouth. 75 tablet 0  . sucralfate (CARAFATE) 1 g tablet Take 1 tablet (1 g total) by mouth 4 (four) times daily -  with meals and at bedtime. 5 minutes before food for esophagitis 120 tablet 30  . tiotropium (SPIRIVA) 18 MCG inhalation capsule Place 18 mcg into inhaler and inhale daily.    . Wound Cleansers (RADIAPLEX EX) Apply topically.     No current facility-administered medications for this visit.     SURGICAL HISTORY:  Past Surgical History:  Procedure Laterality Date  . ABDOMINAL HYSTERECTOMY     1 ovary left  . ENDOBRONCHIAL ULTRASOUND Bilateral 10/15/2016   Procedure: ENDOBRONCHIAL ULTRASOUND;  Surgeon: Collene Gobble, MD;  Location: [facility];  Service: Cardiopulmonary;  Laterality: Bilateral;  . INCONTINENCE SURGERY    . right middle lobe lung surgery  06/26/2016   [facility]    REVIEW OF SYSTEMS:  Constitutional: negative Eyes: negative Ears, nose, mouth, throat, and face: negative Respiratory: negative Cardiovascular: negative Gastrointestinal: negative Genitourinary:negative Integument/breast: negative Hematologic/lymphatic: negative Musculoskeletal:negative Neurological: negative Behavioral/Psych: negative Endocrine: negative Allergic/Immunologic: negative   PHYSICAL EXAMINATION: General appearance: alert, cooperative, fatigued and no distress Head: Normocephalic, without obvious abnormality, atraumatic Neck: no adenopathy, no JVD, supple, symmetrical, trachea midline and thyroid not enlarged, symmetric, no tenderness/mass/nodules Lymph nodes: Cervical, supraclavicular, and axillary nodes normal. Resp: clear to auscultation  bilaterally Back: symmetric, no curvature. ROM normal. No CVA tenderness. Cardio: regular rate and rhythm, S1, S2 normal, no murmur, click, rub or gallop GI: soft, non-tender; bowel sounds normal; no masses,  no organomegaly Extremities: extremities normal, atraumatic, no cyanosis or edema Neurologic: Alert and oriented X 3, normal strength and tone. Normal symmetric reflexes. Normal coordination and gait  ECOG PERFORMANCE STATUS: 1 - Symptomatic but completely ambulatory  Blood pressure (!) 141/69, pulse 100, temperature 98 F (36.7 C), temperature source Oral, resp. rate 18, height 5\' 5"  (1.651 m), weight 138 lb 14.4 oz (63 kg), SpO2 100 %.  LABORATORY DATA: Lab Results  Component Value Date   WBC 5.2 01/27/2017   HGB 9.0 (L) 01/27/2017   HCT 27.6 (L) 01/27/2017   MCV 92.0 01/27/2017   PLT 277 01/27/2017      Chemistry      Component Value Date/Time   NA 139 01/27/2017 1201   NA 135 (L)  01/10/2017 1045   K 4.3 01/27/2017 1201   K 4.9 01/10/2017 1045   CL 106 01/27/2017 1201   CO2 22 01/27/2017 1201   CO2 20 (L) 01/10/2017 1045   BUN 26 01/27/2017 1201   BUN 27.6 (H) 01/10/2017 1045   CREATININE 1.19 (H) 01/27/2017 1201   CREATININE 1.4 (H) 01/10/2017 1045      Component Value Date/Time   CALCIUM 9.2 01/27/2017 1201   CALCIUM 9.7 01/10/2017 1045   ALKPHOS 66 01/27/2017 1201   ALKPHOS 77 01/10/2017 1045   AST 13 01/27/2017 1201   AST 10 01/10/2017 1045   ALT 9 01/27/2017 1201   ALT 11 01/10/2017 1045   BILITOT 0.2 01/27/2017 1201   BILITOT 0.34 01/10/2017 1045       RADIOGRAPHIC STUDIES: Ct Chest W Contrast  Result Date: 01/31/2017 CLINICAL DATA:  Small-cell lung cancer with surgery in 2018. Chemotherapy and radiation therapy finished. Right middle lobe lung surgery at outside hospital June 26, 2016. COPD. EXAM: CT CHEST WITH CONTRAST TECHNIQUE: Multidetector CT imaging of the chest was performed during intravenous contrast administration. CONTRAST:  67mL  ISOVUE-300 IOPAMIDOL (ISOVUE-300) INJECTION 61% COMPARISON:  12/16/2016 FINDINGS: Cardiovascular: Aortic and branch vessel atherosclerosis. Tortuous thoracic aorta. Normal heart size, without pericardial effusion. Lad coronary artery atherosclerosis. No central pulmonary embolism, on this non-dedicated study. Mediastinum/Nodes: No supraclavicular adenopathy. No mediastinal or hilar adenopathy. Subcarinal node is similar, including at 6 mm on 56/series 2. Lungs/Pleura: No pleural fluid. Right middle lobectomy. Biapical pleural-parenchymal scarring. Mild centrilobular emphysema. Upper Abdomen: Normal imaged portions of the liver, spleen, pancreas, gallbladder, right kidney. Interpolar left renal lesion is too small to characterize but likely a cyst. Mild left adrenal thickening is similar. Proximal gastric underdistention. Musculoskeletal: Right posterolateral eighth rib sclerosis is similar to on the prior exam and likely posttraumatic or postsurgical. Mild accentuation of expected thoracic kyphosis. Mild right hemidiaphragm elevation with convex left thoracic spine curvature. IMPRESSION: 1. Status post right middle lobectomy. No evidence of recurrent or metastatic disease. 2. Coronary artery atherosclerosis. Aortic Atherosclerosis (ICD10-I70.0). 3.  Emphysema (ICD10-J43.9). Electronically Signed   By: Abigail Miyamoto M.D.   On: 01/31/2017 14:48    ASSESSMENT AND PLAN: This is a very pleasant 72 years old white female with history of limited stage small cell lung cancer status post left upper lobectomy in June 2018.  The patient had evidence for disease recurrence with hypermetabolic subcarinal lymphadenopathy that was biopsy-proven to be small cell carcinoma. She completed systemic chemotherapy with cisplatin and etoposide.  She is status post 4 cycles.  This is concurrent with radiotherapy.   She tolerated the previous treatment well except for fatigue. Repeat CT scan of the chest performed recently showed no  evidence for disease progression.  I personally and independently reviewed the scans and discussed the scan results with the patient today. I recommended for her to proceed with prophylactic cranial irradiation and she is expected to see Dr. Isidore Moos soon for discussion of this option. I will see her back for follow-up visit in 3 months for evaluation after repeating CT scan of the chest for restaging of her disease. The patient was advised to call immediately if she has any concerning symptoms in the interval. The patient voices understanding of current disease status and treatment options and is in agreement with the current care plan.  All questions were answered. The patient knows to call the clinic with any problems, questions or concerns. We can certainly see the patient much sooner if necessary.  Disclaimer: This note was dictated with voice recognition software. Similar sounding words can inadvertently be transcribed and may not be corrected upon review.

## 2017-02-07 ENCOUNTER — Encounter: Payer: Self-pay | Admitting: Emergency Medicine

## 2017-02-07 ENCOUNTER — Ambulatory Visit (INDEPENDENT_AMBULATORY_CARE_PROVIDER_SITE_OTHER): Payer: Medicare Other | Admitting: Emergency Medicine

## 2017-02-07 DIAGNOSIS — J449 Chronic obstructive pulmonary disease, unspecified: Secondary | ICD-10-CM | POA: Diagnosis not present

## 2017-02-07 DIAGNOSIS — C3411 Malignant neoplasm of upper lobe, right bronchus or lung: Secondary | ICD-10-CM

## 2017-02-07 NOTE — Patient Instructions (Signed)
Please continue your Spiriva once a day as you have been taking it Keep albuterol available to use 2 puffs if needed for shortness of breath, chest tightness, wheezing. Follow with Drs [name] in [place] as planned.  Repeat CT scans of the chest as per their plans Follow with Dr Lamonte Sakai in 6 months or sooner if you have any problems

## 2017-02-07 NOTE — Progress Notes (Signed)
Subjective:    Patient ID: Robyn Barton, female    DOB: July 04, 1945, 72 y.o.   MRN: [id number]  COPD  She complains of shortness of breath. There is no cough or wheezing. Pertinent negatives include no ear pain, fever, headaches, postnasal drip, rhinorrhea, sneezing, sore throat or trouble swallowing. Her past medical history is significant for COPD.   72 yo former smoker (79 pk-yrs), hx Of COPD, recently diagnosed small cell lung cancer in the right middle lobe found by lung cancer screening CT scan. She underwent a right middle lobectomy on 06/26/16 by Dr. Lianne Moris at [facility]. Negative station 7 and 11 R nodes, negative surgical margin. Also with a history of mild obstructive sleep apnea not on CPAP. Post-op she had to be admitted to [facility] for progressive dyspnea, treated for possible HCAP vs ARDS, mechanically ventilated, admitted for several weeks.   She is currently managed on Spiriva. Uses albuterol prn >> about 2x a day. She has had more dyspnea since the surgery and the rehospitailzation.  She was d/c on O2 1L/min, has not been using it.   FEV1 from a review of her notes for/20/18 FVC 2.35 L (80% predicted) FEV1 1.77 L (80% predicted), FEV1 to FVC ratio 76%  She is transferring her care to [facility].   Acute OV 11/15/16 --this is an acute visit for patient with a history of COPD, small cell lung cancer, limited stage status post resection.  I saw her for mediastinal nodal biopsies after she had increasing lymphadenopathy 10/2016.  This was consistent with small cell lung cancer. She is preparing for XRT to the mediastinal lesions. She is s/p first round chemo w Dr [name]. She has noticed in increase in dyspnea on exertion beginning 4-5 days ago. She is currently being treated for UTI. Minimal cough, non-productive. No sick contacts. Low grade fever. She had to use her albuterol a few times over the last few days as well, hearing some wheeze. She notes a mold exposure at home, just  detected yesterday. Denies any CP. She has noticed some L > R LE edema about 3-4 days ago.   ROV 02/07/17 --72 year old woman who follows up today for COPD and small cell lung cancer that was diagnosed and treated with a right middle lobe lobectomy 06/26/16.  She developed mediastinal involvement, has undergone radiation therapy that ended 01/16/17; four rounds chemo with Dr [name]. She has some associated fatigue. She is on spiriva, uses reliably. Rare albuterol use. Usually able to exert without dyspnea. No flares since last time - she did just complete abx for a UTI. She is preparing for preventative whole brain radiation. Her last Ct scan was 1/18, shows good response to therapy.    Review of Systems  Constitutional: Negative for fever and unexpected weight change.  HENT: Negative for congestion, dental problem, ear pain, nosebleeds, postnasal drip, rhinorrhea, sinus pressure, sneezing, sore throat and trouble swallowing.   Eyes: Negative for redness and itching.  Respiratory: Positive for shortness of breath. Negative for cough, chest tightness and wheezing.   Cardiovascular: Negative for palpitations and leg swelling.  Gastrointestinal: Negative for nausea and vomiting.  Genitourinary: Negative for dysuria.  Musculoskeletal: Negative for joint swelling.  Skin: Negative for rash.  Neurological: Negative for headaches.  Hematological: Does not bruise/bleed easily.  Psychiatric/Behavioral: Negative for dysphoric mood. The patient is not nervous/anxious.     Past Medical History:  Diagnosis Date  . Allergic rhinitis   . Anxiety   . Chest pain   .  Elevated TSH    pt denies  . Emphysema lung ([place])   . Enlarged lymph nodes    in chest  . GERD (gastroesophageal reflux disease)   . Goals of care, counseling/discussion 10/21/2016  . History of hiatal hernia   . Hyperlipidemia   . Hypoxemia   . IFG (impaired fasting glucose)   . Osteopenia   . Pneumonia    several  times last 2009  .  Pulmonary nodule   . Recurrent major depressive disorder ([place])   . Sleep apnea    mild no cpap needed  . Small cell carcinoma of right lung ([place]) 2018   surgery right middle lobe removed   . Tobacco dependence   . Varicose veins of both lower extremities   . Vitamin D deficiency      Family History  Problem Relation Age of Onset  . Cancer Neg Hx      Social History   Socioeconomic History  . Marital status: Divorced    Spouse name: Not on file  . Number of children: Not on file  . Years of education: Not on file  . Highest education level: Not on file  Social Needs  . Financial resource strain: Not on file  . Food insecurity - worry: Not on file  . Food insecurity - inability: Not on file  . Transportation needs - medical: Not on file  . Transportation needs - non-medical: Not on file  Occupational History  . Not on file  Tobacco Use  . Smoking status: Former Smoker    Packs/day: 1.50    Years: 50.00    Pack years: 75.00    Types: Cigarettes    Last attempt to quit: 06/14/2016    Years since quitting: 0.6  . Smokeless tobacco: Never Used  Substance and Sexual Activity  . Alcohol use: Yes    Comment: rare  . Drug use: No  . Sexual activity: No  Other Topics Concern  . Not on file  Social History Narrative  . Not on file     No Known Allergies   Outpatient Medications Prior to Visit  Medication Sig Dispense Refill  . albuterol (PROVENTIL HFA;VENTOLIN HFA) 108 (90 Base) MCG/ACT inhaler Inhale 2 puffs into the lungs every 6 (six) hours as needed for wheezing or shortness of breath.    Marland Kitchen alendronate (FOSAMAX) 70 MG tablet Take 70 mg by mouth every Monday. Take with a full glass of water on an empty stomach.     Marland Kitchen atorvastatin (LIPITOR) 40 MG tablet Take 40 mg by mouth at bedtime.     . calcium carbonate (TUMS - DOSED IN MG ELEMENTAL CALCIUM) 500 MG chewable tablet Chew 1 tablet daily as needed by mouth for indigestion or heartburn.    . Cholecalciferol (VITAMIN D)  2000 units tablet Take 2,000 Units by mouth daily.    . diphenhydramine-acetaminophen (TYLENOL PM) 25-500 MG TABS tablet Take 1 tablet at bedtime as needed by mouth (sleep).    Marland Kitchen lidocaine (XYLOCAINE) 2 % solution Mix 10 ml with 2 ounces of water and swallow before eating 100 mL 3  . loperamide (IMODIUM) 2 MG capsule Take 2 mg as needed by mouth for diarrhea or loose stools.    Marland Kitchen loratadine (CLARITIN) 10 MG tablet Take 10 mg by mouth daily as needed (hay fever).    . LORazepam (ATIVAN) 0.5 MG tablet Take 1 tablet (0.5 mg total) by mouth every 6 (six) hours as needed (nausea). 30 tablet 0  .  magnesium oxide (MAG-OX) 400 MG tablet Take 1 tablet by mouth 3 (three) times daily.  1  . omeprazole (PRILOSEC) 40 MG capsule Take 40 mg by mouth daily.   3  . prochlorperazine (COMPAZINE) 10 MG tablet TAKE 1 TABLET (10 MG TOTAL) BY MOUTH EVERY 6 (SIX) HOURS AS NEEDED FOR NAUSEA OR VOMITING. 30 tablet 0  . sucralfate (CARAFATE) 1 g tablet Take 1 tablet (1 g total) by mouth 4 (four) times daily -  with meals and at bedtime. 5 minutes before food for esophagitis 120 tablet 30  . tiotropium (SPIRIVA) 18 MCG inhalation capsule Place 18 mcg into inhaler and inhale daily.    . Wound Cleansers (RADIAPLEX EX) Apply topically.    . sertraline (ZOLOFT) 50 MG tablet Take 2.5 tablets (125 mg total) daily by mouth. 75 tablet 0  . ciprofloxacin (CIPRO) 250 MG tablet Take 1 tablet (250 mg total) by mouth 2 (two) times daily. 14 tablet 0  . ondansetron (ZOFRAN) 8 MG tablet Take 1 tablet (8 mg total) by mouth every 8 (eight) hours as needed for nausea or vomiting. (Patient not taking: Reported on 01/10/2017) 30 tablet 0  . oxyCODONE-acetaminophen (PERCOCET/ROXICET) 5-325 MG tablet Take 1 tablet by mouth every 4 (four) hours as needed for moderate pain or severe pain. (Patient not taking: Reported on 01/10/2017) 30 tablet 0   No facility-administered medications prior to visit.         Objective:   Physical Exam Vitals:    02/07/17 1042  BP: 132/76  Pulse: (!) 110  SpO2: 98%  Weight: 139 lb (63 kg)  Height: 5\' 5"  (1.651 m)   Gen: Pleasant, well-nourished, in no distress,  normal affect  ENT: No lesions,  mouth clear,  oropharynx clear, no postnasal drip  Neck: No JVD, no stridor  Lungs: No use of accessory muscles, clear without rales or rhonchi  Cardiovascular: RRR, heart sounds normal, no murmur or gallops, no peripheral edema  Musculoskeletal: No deformities, no cyanosis or clubbing  Neuro: alert, non focal  Skin: Warm, no lesions or rashes       Assessment & Plan:  COPD (chronic obstructive pulmonary disease) (HCC) Appears to be clinically stable on her current bronchodilator regimen.  We will continue this.  Small cell lung cancer, right upper lobe (HCC) Excellent clinical response and radiographical response based on her CT scan from 1/18.  She is bouncing back from her radiation and chemotherapy, and is planning for prophylactic whole brain radiation. She will continue to follow w Drs Tammi Klippel and [name].   Baltazar Apo, MD, PhD 02/07/2017, 11:19 AM [facility] [phone] or if no answer [phone]

## 2017-02-07 NOTE — Assessment & Plan Note (Signed)
Excellent clinical response and radiographical response based on her CT scan from 1/18.  She is bouncing back from her radiation and chemotherapy, and is planning for prophylactic whole brain radiation. She will continue to follow w Drs Tammi Klippel and [name].

## 2017-02-07 NOTE — Assessment & Plan Note (Signed)
Appears to be clinically stable on her current bronchodilator regimen.  We will continue this.

## 2017-02-10 ENCOUNTER — Ambulatory Visit: Payer: Self-pay

## 2017-02-10 ENCOUNTER — Other Ambulatory Visit: Payer: Self-pay | Admitting: Medical Oncology

## 2017-02-10 ENCOUNTER — Telehealth: Payer: Self-pay | Admitting: Medical Oncology

## 2017-02-10 DIAGNOSIS — C3411 Malignant neoplasm of upper lobe, right bronchus or lung: Secondary | ICD-10-CM

## 2017-02-10 DIAGNOSIS — R399 Unspecified symptoms and signs involving the genitourinary system: Secondary | ICD-10-CM

## 2017-02-10 NOTE — Telephone Encounter (Addendum)
Pt lab appt r/s for tomorrow. She did not hear the phone ring.  Pt states her hearing is worse -

## 2017-02-10 NOTE — Telephone Encounter (Signed)
No need for other labs until next visit in 3 months

## 2017-02-10 NOTE — Telephone Encounter (Signed)
Left message for pt to come to cancer center for lab at 145 to submit urine for UTI symptoms.-

## 2017-02-11 ENCOUNTER — Inpatient Hospital Stay: Payer: Medicare Other

## 2017-02-11 ENCOUNTER — Other Ambulatory Visit: Payer: Self-pay | Admitting: Medical Oncology

## 2017-02-11 ENCOUNTER — Telehealth: Payer: Self-pay | Admitting: Medical Oncology

## 2017-02-11 DIAGNOSIS — N390 Urinary tract infection, site not specified: Secondary | ICD-10-CM

## 2017-02-11 DIAGNOSIS — Z85118 Personal history of other malignant neoplasm of bronchus and lung: Secondary | ICD-10-CM | POA: Diagnosis not present

## 2017-02-11 DIAGNOSIS — R399 Unspecified symptoms and signs involving the genitourinary system: Secondary | ICD-10-CM

## 2017-02-11 LAB — URINALYSIS, COMPLETE (UACMP) WITH MICROSCOPIC
BILIRUBIN URINE: NEGATIVE
GLUCOSE, UA: NEGATIVE mg/dL
KETONES UR: NEGATIVE mg/dL
NITRITE: POSITIVE — AB
Protein, ur: NEGATIVE mg/dL
SPECIFIC GRAVITY, URINE: 1.013 (ref 1.005–1.030)
pH: 6 (ref 5.0–8.0)

## 2017-02-11 MED ORDER — CIPROFLOXACIN HCL 500 MG PO TABS
500.0000 mg | ORAL_TABLET | Freq: Two times a day (BID) | ORAL | 0 refills | Status: DC
Start: 2017-02-11 — End: 2017-02-20

## 2017-02-11 NOTE — Telephone Encounter (Signed)
Start Cipro 500 mg po bid x 5 days. She may need to see her PCP for further follow up of this recurring issue.

## 2017-02-13 ENCOUNTER — Other Ambulatory Visit: Payer: Self-pay | Admitting: Radiation Therapy

## 2017-02-13 ENCOUNTER — Other Ambulatory Visit: Payer: Self-pay

## 2017-02-13 DIAGNOSIS — C3490 Malignant neoplasm of unspecified part of unspecified bronchus or lung: Secondary | ICD-10-CM

## 2017-02-13 LAB — URINE CULTURE: Culture: >=100000 — AB

## 2017-02-13 NOTE — Progress Notes (Signed)
Error, duplicate

## 2017-02-17 ENCOUNTER — Ambulatory Visit: Payer: Self-pay | Admitting: Family Medicine

## 2017-02-17 ENCOUNTER — Ambulatory Visit
Admission: RE | Admit: 2017-02-17 | Discharge: 2017-02-17 | Disposition: A | Discharge status: Home / Self Care | Payer: Medicare Other | Source: Ambulatory Visit | Attending: Radiation Oncology | Admitting: Radiation Oncology

## 2017-02-17 DIAGNOSIS — C3490 Malignant neoplasm of unspecified part of unspecified bronchus or lung: Secondary | ICD-10-CM

## 2017-02-17 MED ORDER — GADOBENATE DIMEGLUMINE 529 MG/ML IV SOLN
12.0000 mL | Freq: Once | INTRAVENOUS | Status: AC | PRN
  Administered 2017-02-17: 12 mL via INTRAVENOUS

## 2017-02-18 ENCOUNTER — Ambulatory Visit: Payer: Medicare Other

## 2017-02-18 ENCOUNTER — Ambulatory Visit
Admission: RE | Admit: 2017-02-18 | Discharge: 2017-02-18 | Disposition: A | Discharge status: Home / Self Care | Payer: Medicare Other | Source: Ambulatory Visit | Attending: Radiation Oncology | Admitting: Radiation Oncology

## 2017-02-18 DIAGNOSIS — C3411 Malignant neoplasm of upper lobe, right bronchus or lung: Secondary | ICD-10-CM | POA: Insufficient documentation

## 2017-02-18 DIAGNOSIS — Z510 Encounter for antineoplastic radiation therapy: Secondary | ICD-10-CM | POA: Insufficient documentation

## 2017-02-19 ENCOUNTER — Ambulatory Visit: Payer: Self-pay | Admitting: Urology

## 2017-02-20 ENCOUNTER — Encounter: Payer: Self-pay | Admitting: Family Medicine

## 2017-02-20 ENCOUNTER — Ambulatory Visit (INDEPENDENT_AMBULATORY_CARE_PROVIDER_SITE_OTHER): Payer: Medicare Other | Admitting: Family Medicine

## 2017-02-20 VITALS — BP 132/84 | HR 105 | Temp 98.1°F | Ht 65.0 in | Wt 138.2 lb

## 2017-02-20 DIAGNOSIS — F339 Major depressive disorder, recurrent, unspecified: Secondary | ICD-10-CM | POA: Diagnosis not present

## 2017-02-20 DIAGNOSIS — R8290 Unspecified abnormal findings in urine: Secondary | ICD-10-CM

## 2017-02-20 LAB — POC URINALSYSI DIPSTICK (AUTOMATED)
Bilirubin, UA: NEGATIVE
Blood, UA: POSITIVE
GLUCOSE UA: NEGATIVE
Ketones, UA: NEGATIVE
Leukocytes, UA: NEGATIVE
NITRITE UA: NEGATIVE
Protein, UA: NEGATIVE
Spec Grav, UA: 1.015 (ref 1.010–1.025)
UROBILINOGEN UA: 0.2 U/dL
pH, UA: 6 (ref 5.0–8.0)

## 2017-02-20 MED ORDER — SERTRALINE HCL 100 MG PO TABS: 100.0000 mg | ORAL_TABLET | Freq: Every day | ORAL | 3 refills | Status: DC

## 2017-02-20 NOTE — Progress Notes (Signed)
Pre visit review using our clinic review tool, if applicable. No additional management support is needed unless otherwise documented below in the visit note. 

## 2017-02-20 NOTE — Progress Notes (Signed)
Chief Complaint  Patient presents with  . Establish Care       New Patient Visit SUBJECTIVE: HPI: Robyn Barton is an 72 y.o.female who is being seen for establishing care.  The patient was previously seen at [facility].  She has a history of small cell carcinoma and sees the [facility] team for this.  She would like everything under 1 umbrella.  She is been having issues with urination over the past several weeks.  She saw her previous office and was prescribed 7 days of antibiotics.  Symptoms resolved for approximately 2 weeks and then returned.  Her oncologist prescribed her 5 days of an antibiotic.  She is still having "bubbles in her urine" in a dark color.  She is not have any pain, discharge, or bleeding.  Her bowel movements are unchanged.  She has a history of depression and anxiety.  She is currently taking Zoloft 100 mg daily.  She is tolerating this well.  Stressors include her cancer diagnosis.  No thoughts of harming herself or others.  No Known Allergies  Past Medical History:  Diagnosis Date  . Allergic rhinitis   . Anxiety   . Elevated TSH    pt denies  . Emphysema lung ([place])   . Enlarged lymph nodes    in chest  . GERD (gastroesophageal reflux disease)   . Goals of care, counseling/discussion 10/21/2016  . History of hiatal hernia   . Hyperlipidemia   . IFG (impaired fasting glucose)   . Osteopenia   . Recurrent major depressive disorder ([place])   . Sleep apnea    mild no cpap needed  . Small cell carcinoma of right lung ([place]) 2018   surgery right middle lobe removed   . Tobacco dependence   . Varicose veins of both lower extremities   . Vitamin D deficiency    Past Surgical History:  Procedure Laterality Date  . ABDOMINAL HYSTERECTOMY     1 ovary left  . ENDOBRONCHIAL ULTRASOUND Bilateral 10/15/2016   Procedure: ENDOBRONCHIAL ULTRASOUND;  Surgeon: Collene Gobble, MD;  Location: [facility];  Service: Cardiopulmonary;  Laterality: Bilateral;  .  INCONTINENCE SURGERY    . right middle lobe lung surgery  06/26/2016   [facility]   Social History   Socioeconomic History  . Marital status: Divorced  Tobacco Use  . Smoking status: Former Smoker    Packs/day: 1.50    Years: 50.00    Pack years: 75.00    Types: Cigarettes    Last attempt to quit: 06/14/2016    Years since quitting: 0.6  . Smokeless tobacco: Never Used  Substance and Sexual Activity  . Alcohol use: Yes    Comment: rare  . Drug use: No  . Sexual activity: No   Family History  Problem Relation Age of Onset  . Cancer Neg Hx     Current Outpatient Medications:  .  albuterol (PROVENTIL HFA;VENTOLIN HFA) 108 (90 Base) MCG/ACT inhaler, Inhale 2 puffs into the lungs every 6 (six) hours as needed for wheezing or shortness of breath., Disp: , Rfl:  .  alendronate (FOSAMAX) 70 MG tablet, Take 70 mg by mouth every Monday. Take with a full glass of water on an empty stomach. , Disp: , Rfl:  .  atorvastatin (LIPITOR) 40 MG tablet, Take 40 mg by mouth at bedtime. , Disp: , Rfl:  .  calcium carbonate (TUMS - DOSED IN MG ELEMENTAL CALCIUM) 500 MG chewable tablet, Chew 1 tablet daily as needed  by mouth for indigestion or heartburn., Disp: , Rfl:  .  Cholecalciferol (VITAMIN D) 2000 units tablet, Take 2,000 Units by mouth daily., Disp: , Rfl:  .  diphenhydramine-acetaminophen (TYLENOL PM) 25-500 MG TABS tablet, Take 1 tablet at bedtime as needed by mouth (sleep)., Disp: , Rfl:  .  lidocaine (XYLOCAINE) 2 % solution, Mix 10 ml with 2 ounces of water and swallow before eating, Disp: 100 mL, Rfl: 3 .  loperamide (IMODIUM) 2 MG capsule, Take 2 mg as needed by mouth for diarrhea or loose stools., Disp: , Rfl:  .  loratadine (CLARITIN) 10 MG tablet, Take 10 mg by mouth daily as needed (hay fever)., Disp: , Rfl:  .  LORazepam (ATIVAN) 0.5 MG tablet, Take 1 tablet (0.5 mg total) by mouth every 6 (six) hours as needed (nausea)., Disp: 30 tablet, Rfl: 0 .  magnesium oxide (MAG-OX) 400 MG  tablet, Take 1 tablet by mouth 3 (three) times daily., Disp: , Rfl: 1 .  omeprazole (PRILOSEC) 40 MG capsule, Take 40 mg by mouth daily. , Disp: , Rfl: 3 .  prochlorperazine (COMPAZINE) 10 MG tablet, TAKE 1 TABLET (10 MG TOTAL) BY MOUTH EVERY 6 (SIX) HOURS AS NEEDED FOR NAUSEA OR VOMITING., Disp: 30 tablet, Rfl: 0 .  sucralfate (CARAFATE) 1 g tablet, Take 1 tablet (1 g total) by mouth 4 (four) times daily -  with meals and at bedtime. 5 minutes before food for esophagitis, Disp: 120 tablet, Rfl: 30 .  tiotropium (SPIRIVA) 18 MCG inhalation capsule, Place 18 mcg into inhaler and inhale daily., Disp: , Rfl:  .  Wound Cleansers (RADIAPLEX EX), Apply topically., Disp: , Rfl:  .  sertraline (ZOLOFT) 100 MG tablet, Take 1 tablet (100 mg total) by mouth daily., Disp: 90 tablet, Rfl: 3  No LMP recorded. Patient has had a hysterectomy.  ROS Const: Denies fevers  GU: As noted in HPI   OBJECTIVE: BP 132/84 (BP Location: Left Arm, Patient Position: Sitting, Cuff Size: Normal)   Pulse (!) 105   Temp 98.1 F (36.7 C) (Oral)   Ht 5\' 5"  (1.651 m)   Wt 138 lb 4 oz (62.7 kg)   SpO2 97%   BMI 23.01 kg/m   Constitutional: -  VS reviewed -  Well developed, well nourished, appears stated age -  No apparent distress  Psychiatric: -  Oriented to person, place, and time -  Memory intact -  Affect and mood normal -  Fluent conversation, good eye contact -  Judgment and insight age appropriate  Eye: -  Conjunctivae clear, no discharge -  Pupils symmetric, round, reactive to light  ENMT: -  MMM    Pharynx moist, no exudate, no erythema  Neck: -  No gross swelling, no palpable masses -  Thyroid midline, not enlarged, mobile, no palpable masses  Cardiovascular: -  RRR -  No LE edema  Respiratory: -  Normal respiratory effort, no accessory muscle use, no retraction -  Breath sounds equal, no wheezes, no ronchi, no crackles  Gastrointestinal: -  Bowel sounds normal -  No tenderness, no distention, no  guarding, no masses  Skin: -  No significant lesion on inspection -  Warm and dry to palpation   ASSESSMENT/PLAN: Depression, recurrent (HCC) - Plan: sertraline (ZOLOFT) 100 MG tablet  Urine abnormality - Plan: POCT Urinalysis Dipstick (Automated), Urine Culture, CANCELED: Urine Microscopic Only  Patient instructed to sign release of records form from her previous PCP.  Refill Zoloft. UA not suggestive of  infection, will culture though.  Need microscopy. Patient should return in 1 mo for CPE. The patient voiced understanding and agreement to the plan.   [place], DO 02/20/17  12:00 PM

## 2017-02-20 NOTE — Patient Instructions (Addendum)
Try metamucil for your stools.   Stay hydrated.   We will look into the [employer].   I will likely culture your urine. Expect a message maybe over the weekend, but likely by Monday with results of this.   Let us know if you need anything.

## 2017-02-22 LAB — URINE CULTURE
MICRO NUMBER: 90166516
SPECIMEN QUALITY:: ADEQUATE

## 2017-02-27 NOTE — Progress Notes (Signed)
Location/Histology of Brain Tumor:  She is here to discuss prophylactic cranial irradiation.   Patient presented with symptoms of:  N/A  Past or anticipated interventions, if any, per neurosurgery: N/A  Past or anticipated interventions, if any, per medical oncology:  02/04/17 Dr. Earlie Server DIAGNOSIS: Recurrent small cell lung cancer initially diagnosed as Limited stage (T1a, N0, M0) small cell lung cancer diagnosed in June 2018. PRIOR THERAPY:  1) right upper lobectomy on 06/26/2016 and the final pathology was consistent with small cell lung cancer measuring 1.6 cm in size. 2) systemic chemotherapy with cisplatin 60 MG/M2 on day 1 and etoposide 120 MG/M2 on days 1, 2 and 3 every 3 weeks.  Status post 4 cycles.  This was concurrent with radiation. CURRENT THERAPY: Observation.  ---I recommended for her to proceed with prophylactic cranial irradiation and she is expected to see Dr. Isidore Moos soon for discussion of this option.  Dose of Decadron, if applicable: N/A  Recent neurologic symptoms, if any:   Seizures: No  Headaches: No  Nausea: No  Dizziness/ataxia: She reports occasional dizziness when standing at times.   Difficulty with hand coordination: No  Focal numbness/weakness: No  Visual deficits/changes: No  Confusion/Memory deficits: She reports difficulty with STM at times. She is not sure if she relates this to her chemotherapy treatment or age.   Pain: She reports pain to the front and back area where she received radiation. She reports that the pain is positional.   Painful bone metastases at present, if any: N/A  SAFETY ISSUES: Prior radiation? Yes, Radiation treatment dates: 11/18/16-01/16/17 Site/dose: Lung/ 59.4 Gy in 33 fractions       Pacemaker/ICD? No  Possible current pregnancy? No  Is the patient on methotrexate? No  Additional Complaints / other details:  Brain MRI 02/17/17 IMPRESSION: Negative for metastatic disease to the brain Progression of white  matter changes since the prior MRI, likely related to interval cancer treatment  BP 111/74   Pulse 85   Temp 98.4 F (36.9 C)   Ht 5\' 5"  (1.651 m)   Wt 141 lb 3.2 oz (64 kg)   SpO2 100% Comment: room air  BMI 23.50 kg/m    Wt Readings from Last 3 Encounters:  03/05/17 141 lb 3.2 oz (64 kg)  02/20/17 138 lb 4 oz (62.7 kg)  02/07/17 139 lb (63 kg)

## 2017-03-03 ENCOUNTER — Other Ambulatory Visit: Payer: Self-pay | Admitting: Family Medicine

## 2017-03-03 ENCOUNTER — Telehealth: Payer: Self-pay | Admitting: Family Medicine

## 2017-03-03 ENCOUNTER — Other Ambulatory Visit (INDEPENDENT_AMBULATORY_CARE_PROVIDER_SITE_OTHER): Payer: Medicare Other

## 2017-03-03 DIAGNOSIS — N390 Urinary tract infection, site not specified: Secondary | ICD-10-CM | POA: Diagnosis not present

## 2017-03-03 NOTE — Telephone Encounter (Signed)
I am OK with that since I recently saw her. I do not do this routinely though. TY.

## 2017-03-03 NOTE — Telephone Encounter (Signed)
Copied from [place]. Topic: Quick Communication - See Telephone Encounter >> Mar 03, 2017  9:51 AM Percell Belt A wrote: CRM for notification. See Telephone encounter for: pt called in and said that she was just in a few weeks ago.  She stated that she feel like she has a UTI and would like to come by to just give a sample?  Is she able to do that?    Best number [phone]  03/03/17.

## 2017-03-03 NOTE — Telephone Encounter (Signed)
Patient called back and scheduled lab appt for today.

## 2017-03-03 NOTE — Telephone Encounter (Signed)
Put in order for urine test. Called left message to call back.

## 2017-03-04 ENCOUNTER — Other Ambulatory Visit: Payer: Self-pay | Admitting: Family Medicine

## 2017-03-04 LAB — URINALYSIS, ROUTINE W REFLEX MICROSCOPIC
Bilirubin Urine: NEGATIVE
Ketones, ur: NEGATIVE
Nitrite: POSITIVE — AB
Specific Gravity, Urine: <=1.005 — AB (ref 1.000–1.030)
Total Protein, Urine: NEGATIVE
URINE GLUCOSE: NEGATIVE
UROBILINOGEN UA: 0.2 (ref 0.0–1.0)
pH: 6 (ref 5.0–8.0)

## 2017-03-04 MED ORDER — NITROFURANTOIN MONOHYD MACRO 100 MG PO CAPS: 100.0000 mg | ORAL_CAPSULE | Freq: Two times a day (BID) | ORAL | 0 refills | Status: DC

## 2017-03-05 ENCOUNTER — Encounter: Payer: Self-pay | Admitting: Radiation Oncology

## 2017-03-05 ENCOUNTER — Ambulatory Visit
Admission: RE | Admit: 2017-03-05 | Discharge: 2017-03-05 | Disposition: A | Discharge status: Home / Self Care | Payer: Medicare Other | Source: Ambulatory Visit | Attending: Radiation Oncology | Admitting: Radiation Oncology

## 2017-03-05 VITALS — BP 111/74 | HR 85 | Temp 98.4°F | Ht 65.0 in | Wt 141.2 lb

## 2017-03-05 DIAGNOSIS — F17210 Nicotine dependence, cigarettes, uncomplicated: Secondary | ICD-10-CM | POA: Insufficient documentation

## 2017-03-05 DIAGNOSIS — Z79899 Other long term (current) drug therapy: Secondary | ICD-10-CM | POA: Diagnosis not present

## 2017-03-05 DIAGNOSIS — C3411 Malignant neoplasm of upper lobe, right bronchus or lung: Secondary | ICD-10-CM | POA: Diagnosis present

## 2017-03-05 DIAGNOSIS — F419 Anxiety disorder, unspecified: Secondary | ICD-10-CM | POA: Diagnosis not present

## 2017-03-05 DIAGNOSIS — R42 Dizziness and giddiness: Secondary | ICD-10-CM | POA: Diagnosis not present

## 2017-03-05 DIAGNOSIS — R413 Other amnesia: Secondary | ICD-10-CM | POA: Diagnosis not present

## 2017-03-05 DIAGNOSIS — E559 Vitamin D deficiency, unspecified: Secondary | ICD-10-CM | POA: Insufficient documentation

## 2017-03-05 DIAGNOSIS — R109 Unspecified abdominal pain: Secondary | ICD-10-CM | POA: Insufficient documentation

## 2017-03-05 DIAGNOSIS — K219 Gastro-esophageal reflux disease without esophagitis: Secondary | ICD-10-CM | POA: Diagnosis not present

## 2017-03-05 DIAGNOSIS — Z809 Family history of malignant neoplasm, unspecified: Secondary | ICD-10-CM | POA: Diagnosis not present

## 2017-03-05 DIAGNOSIS — M8580 Other specified disorders of bone density and structure, unspecified site: Secondary | ICD-10-CM | POA: Insufficient documentation

## 2017-03-05 DIAGNOSIS — J439 Emphysema, unspecified: Secondary | ICD-10-CM | POA: Insufficient documentation

## 2017-03-05 DIAGNOSIS — Z298 Encounter for other specified prophylactic measures: Secondary | ICD-10-CM | POA: Insufficient documentation

## 2017-03-05 DIAGNOSIS — F329 Major depressive disorder, single episode, unspecified: Secondary | ICD-10-CM | POA: Diagnosis not present

## 2017-03-05 DIAGNOSIS — Z9221 Personal history of antineoplastic chemotherapy: Secondary | ICD-10-CM | POA: Diagnosis not present

## 2017-03-05 DIAGNOSIS — G4730 Sleep apnea, unspecified: Secondary | ICD-10-CM | POA: Diagnosis not present

## 2017-03-05 DIAGNOSIS — E785 Hyperlipidemia, unspecified: Secondary | ICD-10-CM | POA: Diagnosis not present

## 2017-03-05 DIAGNOSIS — Z923 Personal history of irradiation: Secondary | ICD-10-CM | POA: Diagnosis not present

## 2017-03-05 DIAGNOSIS — Z510 Encounter for antineoplastic radiation therapy: Secondary | ICD-10-CM | POA: Diagnosis not present

## 2017-03-05 NOTE — Progress Notes (Signed)
Radiation Oncology         [phone] ________________________________  Outpatient re consultation   Name: Robyn Barton MRN: [id number]  Date: 03/05/2017  DOB: 1945/09/04  OI:[facility], Crosby Oyster, DO  [name], MD   REFERRING PHYSICIAN: [name], MD  DIAGNOSIS:    ICD-10-CM   1. Small cell lung cancer, right upper lobe (HCC) C34.11     CHIEF COMPLAINT: Here to discuss management of lung cancer  HISTORY OF PRESENT ILLNESS::Robyn Barton is a 72 y.o. female who was initially diagnosed with limited stage (T1a, N0, M0) small cell lung cancer in June 2018. She underwent right upper lobectomy on 06/26/16 and final pathology was consistent with small cell lung cancer, measuring 1.6 cm in size. 6 nodes were all negative.  On 09-14-16 a CT chest showed new subcarinal adenopathy.  MR brain was negative.  PET on 09-26-16 showed SUV uptake at the subcarinal region  but negative elsewhere.    Then she proceeded with systemic chemotherapy with cisplatin 60 MG/M2 on day 1 and etoposide 120 MG/M2 on days 1, 2, and 3 every 3 weeks. Status post 4 cycle. This was concurrent with radiation to the lung. CT of the chest on 01/31/17 showed status post right middle lobectomy. No evidence of progression, recurrence or metastatic disease. MRI of the brain on 02/17/17 showed negative for metastatic disease to the brain. There was progression of white matter changes since the prior MRI, likely related to interval cancer treatment. The patient presents today to discuss prophylactic cranial irradiation.  The patient reports occasional dizziness when standing at times. She reports "not drinking as much as she should". She reports difficulty with short term memory at times and word finding difficulty. She is unsure if this is related to chemotherapy or age. She reports this was present prior to chemotherapy but it has worsened with chemotherapy. She reports pain to the front and back area where she  received radiation. She reports the pain is positional. She denies seizures, headaches, nausea, difficulty with hand coordination, focal numbness/weakness, or visual deficits/ changes.   PREVIOUS RADIATION THERAPY: Yes 11/18/16-01/16/17: 59.4 Gy to the lung in 33 fractions  PAST MEDICAL HISTORY:  has a past medical history of Allergic rhinitis, Anxiety, Elevated TSH, Emphysema lung ([place]), Enlarged lymph nodes, GERD (gastroesophageal reflux disease), Goals of care, counseling/discussion (10/21/2016), History of hiatal hernia, Hyperlipidemia, IFG (impaired fasting glucose), Osteopenia, Recurrent major depressive disorder ([place]), Sleep apnea, Small cell carcinoma of right lung ([place]) (2018), Tobacco dependence, Varicose veins of both lower extremities, and Vitamin D deficiency.    PAST SURGICAL HISTORY: Past Surgical History:  Procedure Laterality Date  . ABDOMINAL HYSTERECTOMY     1 ovary left  . ENDOBRONCHIAL ULTRASOUND Bilateral 10/15/2016   Procedure: ENDOBRONCHIAL ULTRASOUND;  Surgeon: Collene Gobble, MD;  Location: [facility];  Service: Cardiopulmonary;  Laterality: Bilateral;  . INCONTINENCE SURGERY     bladder sling  . right middle lobe lung surgery  06/26/2016   [facility]    FAMILY HISTORY: family history includes COPD in her father; Cancer in her father; Depression in her mother; Heart disease in her father; High Cholesterol in her father; High blood pressure in her father.  SOCIAL HISTORY:  reports that she quit smoking about 8 months ago. Her smoking use included cigarettes. She has a 75.00 pack-year smoking history. she has never used smokeless tobacco. She reports that she drinks alcohol. She reports that she does not use drugs.  ALLERGIES: Patient has no known  allergies.  MEDICATIONS:  Current Outpatient Medications  Medication Sig Dispense Refill  . alendronate (FOSAMAX) 70 MG tablet Take 70 mg by mouth every Monday. Take with a full glass of water on an empty stomach.     Marland Kitchen  atorvastatin (LIPITOR) 40 MG tablet Take 40 mg by mouth at bedtime.     . calcium carbonate (TUMS - DOSED IN MG ELEMENTAL CALCIUM) 500 MG chewable tablet Chew 1 tablet daily as needed by mouth for indigestion or heartburn.    . Cholecalciferol (VITAMIN D) 2000 units tablet Take 2,000 Units by mouth daily.    . diphenhydramine-acetaminophen (TYLENOL PM) 25-500 MG TABS tablet Take 1 tablet at bedtime as needed by mouth (sleep).    Marland Kitchen loperamide (IMODIUM) 2 MG capsule Take 2 mg as needed by mouth for diarrhea or loose stools.    Marland Kitchen loratadine (CLARITIN) 10 MG tablet Take 10 mg by mouth daily as needed (hay fever).    Marland Kitchen omeprazole (PRILOSEC) 40 MG capsule Take 40 mg by mouth daily.   3  . sertraline (ZOLOFT) 100 MG tablet Take 1 tablet (100 mg total) by mouth daily. 90 tablet 3  . tiotropium (SPIRIVA) 18 MCG inhalation capsule Place 18 mcg into inhaler and inhale daily.    Marland Kitchen albuterol (PROVENTIL HFA;VENTOLIN HFA) 108 (90 Base) MCG/ACT inhaler Inhale 2 puffs into the lungs every 6 (six) hours as needed for wheezing or shortness of breath.    Marland Kitchen amoxicillin-clavulanate (AUGMENTIN) 500-125 MG tablet Take 1 tablet (500 mg total) by mouth 2 (two) times daily for 7 days. 14 tablet 0  . magnesium oxide (MAG-OX) 400 MG tablet Take 1 tablet by mouth 3 (three) times daily.  1   No current facility-administered medications for this encounter.     REVIEW OF SYSTEMS: as above   PHYSICAL EXAM:  height is 5\' 5"  (1.651 m) and weight is 141 lb 3.2 oz (64 kg). Her temperature is 98.4 F (36.9 C). Her blood pressure is 111/74 and her pulse is 85. Her oxygen saturation is 100%.   General: Alert and oriented, in no acute distress HEENT: Head is normocephalic. Extraocular movements are intact. Oropharynx and oral cavity is clear. Neck: Neck is supple, no palpable cervical or supraclavicular lymphadenopathy. Heart: Regular in rate and rhythm with no murmurs, rubs, or gallops. Chest: Clear to auscultation bilaterally, with  no rhonchi, wheezes, or rales. Abdomen: Soft. Tenderness to palpation to the periumbilical area just superior and right to the umbilicus.  Extremities: No cyanosis or edema. Lymphatics: see Neck Exam Skin: No concerning lesions. Musculoskeletal: symmetric strength and muscle tone throughout. Neurologic: Cranial nerves II through XII are grossly intact. No obvious focalities. Speech is fluent. Coordination is intact. Object recall 3/3 at 3 minutes.  She demonstrates continuous, subtle, involuntary-appearing movements of the head/neck/torso Psychiatric: Judgment and insight are intact. Affect is appropriate.   ECOG = 1  0 - Asymptomatic (Fully active, able to carry on all predisease activities without restriction)  1 - Symptomatic but completely ambulatory (Restricted in physically strenuous activity but ambulatory and able to carry out work of a light or sedentary nature. For example, light housework, office work)  2 - Symptomatic, <50% in bed during the day (Ambulatory and capable of all self care but unable to carry out any work activities. Up and about more than 50% of waking hours)  3 - Symptomatic, >50% in bed, but not bedbound (Capable of only limited self-care, confined to bed or chair 50% or more of  waking hours)  4 - Bedbound (Completely disabled. Cannot carry on any self-care. Totally confined to bed or chair)  5 - Death   Eustace Pen MM, Creech RH, Tormey DC, et al. [phone]). "Toxicity and response criteria of the [facility]". [place] Oncol. 5 (6): 649-55   LABORATORY DATA:  Lab Results  Component Value Date   WBC 5.2 01/27/2017   HGB 9.0 (L) 01/27/2017   HCT 27.6 (L) 01/27/2017   MCV 92.0 01/27/2017   PLT 277 01/27/2017   CMP     Component Value Date/Time   NA 139 01/27/2017 1201   NA 135 (L) 01/10/2017 1045   K 4.3 01/27/2017 1201   K 4.9 01/10/2017 1045   CL 106 01/27/2017 1201   CO2 22 01/27/2017 1201   CO2 20 (L) 01/10/2017 1045    GLUCOSE 125 01/27/2017 1201   GLUCOSE 103 01/10/2017 1045   BUN 26 01/27/2017 1201   BUN 27.6 (H) 01/10/2017 1045   CREATININE 1.19 (H) 01/27/2017 1201   CREATININE 1.4 (H) 01/10/2017 1045   CALCIUM 9.2 01/27/2017 1201   CALCIUM 9.7 01/10/2017 1045   PROT 7.0 01/27/2017 1201   PROT 6.7 01/10/2017 1045   ALBUMIN 3.3 (L) 01/27/2017 1201   ALBUMIN 3.5 01/10/2017 1045   AST 13 01/27/2017 1201   AST 10 01/10/2017 1045   ALT 9 01/27/2017 1201   ALT 11 01/10/2017 1045   ALKPHOS 66 01/27/2017 1201   ALKPHOS 77 01/10/2017 1045   BILITOT 0.2 01/27/2017 1201   BILITOT 0.34 01/10/2017 1045   GFRNONAA 44 (L) 01/27/2017 1201   GFRAA 51 (L) 01/27/2017 1201         RADIOGRAPHY: Mr Jeri Cos DG Contrast  Result Date: 02/17/2017 CLINICAL DATA:  Small cell lung cancer. EXAM: MRI HEAD WITHOUT AND WITH CONTRAST TECHNIQUE: Multiplanar, multiecho pulse sequences of the brain and surrounding structures were obtained without and with intravenous contrast. CONTRAST:  23mL MULTIHANCE GADOBENATE DIMEGLUMINE 529 MG/ML IV SOLN COMPARISON:  MRI 09/14/2016 FINDINGS: Brain: Ventricle size and cerebral volume normal. Scattered small white matter hyperintensities bilaterally, with mild progression from the prior study. No enhancing lesions. Negative for hemorrhage, mass, or edema. Negative for hydrocephalus. No enhancing metastatic deposits are seen postcontrast infusion. Vascular: Normal arterial flow voids. Skull and upper cervical spine: Negative Sinuses/Orbits: Mild mucosal edema paranasal sinuses. Bilateral lens replacement Other: None IMPRESSION: Negative for metastatic disease to the brain Progression of white matter changes since the prior MRI, likely related to interval cancer treatment. Electronically Signed   By: Franchot Gallo M.D.   On: 02/17/2017 10:41      IMPRESSION/PLAN: Small cell lung cancer responsive to CHRT  It was a pleasure meeting the patient today. We discussed the risks, benefits, and side  effects of PCI brain radiotherapy. We spoke about possible nausea, headaches, and fatigue associated with treatment.  We discussed risk of further cognitive decline, though this is usually not severe.  I recommend prophylactic cranial irradiation to reduce the risk of recurrence in the brain by half. I anticipate 10 treatments over two weeks to the brain. The patient is enthusiastic about proceeding with her treatment. I look forward to participating in the patient's care. The patient is scheduled for CT simulation and treatment planning immediately following today's appointment. A consent form was signed and a copy was placed in the patient's chart.   I offered the patient a referral to occupational therapy /neurorehab for short term memory difficulties. She is interested in  proceeding with therapy after her radiation treatment.  I advised the patient drink 6 cups of water daily for dizziness upon standing.   Re: abdominal soreness: Advised patient to proceed with discussing this with her GI physician and consider a colonoscopy in May or June as she states she is due for this.  I spent 30 minutes  face to face with the patient and more than 50% of that time was spent in counseling and/or coordination of care.   __________________________________________   Eppie Gibson, MD  This document serves as a record of services personally performed by Eppie Gibson, MD. It was created on her behalf by Bethann Humble, a trained medical scribe. The creation of this record is based on the scribe's personal observations and the provider's statements to them. This document has been checked and approved by the attending provider.

## 2017-03-05 NOTE — Progress Notes (Signed)
Simulation and Treatment Planning Note   ICD-10-CM   1. Small cell lung cancer, right upper lobe [facility]) C34.11     Outpatient  The patient was taken to the CT simulator and laid in the supine position. An Aquaplast facemask was custom fitted to the patient's head/anatomy. High-resolution CT axial imaging was obtained of the patient's brain. An isocenter was placed in the middle of the brain.  Treatment Planning Note I plan to treat the patient's whole brain to 25 Gy in 10 fractions (PCI). The patient's brain will be treated with opposed   fields using MLCs for custom blocks. Wedges will be used as needed for dose homogeneity.  I have ordered an isodose plan.  3 complex treatment devices were supervised by me and custom made today - the mask, and the 2 fields with MLCs to block eyes, lenses, spinal cord.  All of this is medically necessary.  -----------------------------------  Eppie Gibson, MD  This document serves as a record of services personally performed by Eppie Gibson, MD. It was created on her behalf by Bethann Humble, a trained medical scribe. The creation of this record is based on the scribe's personal observations and the provider's statements to them. This document has been checked and approved by the attending provider.

## 2017-03-06 ENCOUNTER — Telehealth: Payer: Self-pay

## 2017-03-06 LAB — URINE CULTURE
MICRO NUMBER:: 90212784
SPECIMEN QUALITY: ADEQUATE

## 2017-03-06 NOTE — Telephone Encounter (Signed)
Medical Record request received from [facility], form forwarded to [place], Engineer, building services for scanning/emailing.

## 2017-03-07 ENCOUNTER — Other Ambulatory Visit: Payer: Self-pay | Admitting: Radiation Oncology

## 2017-03-07 ENCOUNTER — Encounter: Payer: Self-pay | Admitting: Radiation Oncology

## 2017-03-07 ENCOUNTER — Telehealth: Payer: Self-pay | Admitting: *Deleted

## 2017-03-07 ENCOUNTER — Other Ambulatory Visit: Payer: Self-pay | Admitting: Family Medicine

## 2017-03-07 DIAGNOSIS — C3491 Malignant neoplasm of unspecified part of right bronchus or lung: Secondary | ICD-10-CM

## 2017-03-07 DIAGNOSIS — R413 Other amnesia: Secondary | ICD-10-CM

## 2017-03-07 MED ORDER — AMOXICILLIN-POT CLAVULANATE 500-125 MG PO TABS: 1.0000 | ORAL_TABLET | Freq: Two times a day (BID) | ORAL | 0 refills | Status: AC

## 2017-03-07 NOTE — Progress Notes (Signed)
Stop macrobid, start augmentin given MIC.

## 2017-03-07 NOTE — Telephone Encounter (Signed)
Called patient to inform of occupational therapy appt. for 05-12-17- arrival time - 10:45 am @ [address], ph. No. - [phone], spoke with patient and she is aware of this appt.

## 2017-03-10 ENCOUNTER — Ambulatory Visit
Admission: RE | Admit: 2017-03-10 | Discharge: 2017-03-10 | Disposition: A | Discharge status: Home / Self Care | Payer: Medicare Other | Source: Ambulatory Visit | Attending: Radiation Oncology | Admitting: Radiation Oncology

## 2017-03-10 DIAGNOSIS — C3411 Malignant neoplasm of upper lobe, right bronchus or lung: Secondary | ICD-10-CM

## 2017-03-10 DIAGNOSIS — Z510 Encounter for antineoplastic radiation therapy: Secondary | ICD-10-CM | POA: Diagnosis not present

## 2017-03-10 MED ORDER — SONAFINE EX EMUL
1.0000 "application " | Freq: Once | CUTANEOUS | Status: AC
  Administered 2017-03-10: 1 via TOPICAL

## 2017-03-10 NOTE — Progress Notes (Signed)
Pt here for patient teaching.  Pt given Radiation and You booklet, skin care instructions and Sonafine.  Reviewed areas of pertinence such as fatigue, hair loss and skin changes . Pt able to give teach back of to pat skin, use unscented/gentle soap and drink plenty of water,apply Sonafine bid and avoid applying anything to skin within 4 hours of treatment. Pt verbalizes understanding of information given and will contact nursing with any questions or concerns.     [email or web address]

## 2017-03-11 ENCOUNTER — Ambulatory Visit
Admission: RE | Admit: 2017-03-11 | Discharge: 2017-03-11 | Disposition: A | Discharge status: Home / Self Care | Payer: Medicare Other | Source: Ambulatory Visit | Attending: Radiation Oncology | Admitting: Radiation Oncology

## 2017-03-11 DIAGNOSIS — Z510 Encounter for antineoplastic radiation therapy: Secondary | ICD-10-CM | POA: Diagnosis not present

## 2017-03-12 ENCOUNTER — Ambulatory Visit: Payer: Medicare Other | Admitting: Radiation Oncology

## 2017-03-12 ENCOUNTER — Ambulatory Visit
Admission: RE | Admit: 2017-03-12 | Discharge: 2017-03-12 | Disposition: A | Discharge status: Home / Self Care | Payer: Medicare Other | Source: Ambulatory Visit | Attending: Radiation Oncology | Admitting: Radiation Oncology

## 2017-03-12 DIAGNOSIS — Z510 Encounter for antineoplastic radiation therapy: Secondary | ICD-10-CM | POA: Diagnosis not present

## 2017-03-13 ENCOUNTER — Ambulatory Visit
Admission: RE | Admit: 2017-03-13 | Discharge: 2017-03-13 | Disposition: A | Discharge status: Home / Self Care | Payer: Medicare Other | Source: Ambulatory Visit | Attending: Radiation Oncology | Admitting: Radiation Oncology

## 2017-03-13 DIAGNOSIS — Z510 Encounter for antineoplastic radiation therapy: Secondary | ICD-10-CM | POA: Diagnosis not present

## 2017-03-14 ENCOUNTER — Ambulatory Visit
Admission: RE | Admit: 2017-03-14 | Discharge: 2017-03-14 | Disposition: A | Discharge status: Home / Self Care | Payer: Medicare Other | Source: Ambulatory Visit | Attending: Radiation Oncology | Admitting: Radiation Oncology

## 2017-03-14 DIAGNOSIS — Z510 Encounter for antineoplastic radiation therapy: Secondary | ICD-10-CM | POA: Diagnosis not present

## 2017-03-14 DIAGNOSIS — C3411 Malignant neoplasm of upper lobe, right bronchus or lung: Secondary | ICD-10-CM | POA: Insufficient documentation

## 2017-03-17 ENCOUNTER — Ambulatory Visit
Admission: RE | Admit: 2017-03-17 | Discharge: 2017-03-17 | Disposition: A | Discharge status: Home / Self Care | Payer: Medicare Other | Source: Ambulatory Visit | Attending: Radiation Oncology | Admitting: Radiation Oncology

## 2017-03-17 DIAGNOSIS — C3411 Malignant neoplasm of upper lobe, right bronchus or lung: Secondary | ICD-10-CM | POA: Diagnosis not present

## 2017-03-18 ENCOUNTER — Ambulatory Visit
Admission: RE | Admit: 2017-03-18 | Discharge: 2017-03-18 | Disposition: A | Discharge status: Home / Self Care | Payer: Medicare Other | Source: Ambulatory Visit | Attending: Radiation Oncology | Admitting: Radiation Oncology

## 2017-03-18 DIAGNOSIS — C3411 Malignant neoplasm of upper lobe, right bronchus or lung: Secondary | ICD-10-CM | POA: Diagnosis not present

## 2017-03-19 ENCOUNTER — Encounter: Payer: Self-pay | Admitting: Obstetrics & Gynecology

## 2017-03-19 ENCOUNTER — Ambulatory Visit
Admission: RE | Admit: 2017-03-19 | Discharge: 2017-03-19 | Disposition: A | Discharge status: Home / Self Care | Payer: Medicare Other | Source: Ambulatory Visit | Attending: Radiation Oncology | Admitting: Radiation Oncology

## 2017-03-19 DIAGNOSIS — C3411 Malignant neoplasm of upper lobe, right bronchus or lung: Secondary | ICD-10-CM | POA: Diagnosis not present

## 2017-03-20 ENCOUNTER — Ambulatory Visit
Admission: RE | Admit: 2017-03-20 | Discharge: 2017-03-20 | Disposition: A | Discharge status: Home / Self Care | Payer: Medicare Other | Source: Ambulatory Visit | Attending: Radiation Oncology | Admitting: Radiation Oncology

## 2017-03-20 DIAGNOSIS — C3411 Malignant neoplasm of upper lobe, right bronchus or lung: Secondary | ICD-10-CM | POA: Diagnosis not present

## 2017-03-21 ENCOUNTER — Ambulatory Visit
Admission: RE | Admit: 2017-03-21 | Discharge: 2017-03-21 | Disposition: A | Discharge status: Home / Self Care | Payer: Medicare Other | Source: Ambulatory Visit | Attending: Radiation Oncology | Admitting: Radiation Oncology

## 2017-03-21 DIAGNOSIS — C3411 Malignant neoplasm of upper lobe, right bronchus or lung: Secondary | ICD-10-CM | POA: Diagnosis not present

## 2017-03-24 ENCOUNTER — Ambulatory Visit: Payer: Medicare Other

## 2017-03-25 ENCOUNTER — Ambulatory Visit: Payer: Medicare Other

## 2017-03-26 ENCOUNTER — Encounter: Payer: Self-pay | Admitting: Radiation Oncology

## 2017-03-26 NOTE — Progress Notes (Signed)
  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 During treatment, the patient complained of occasional nausea. She denied pain, visual changes, or cognitive changes.  Plan: The patient has completed radiation treatment. The patient will return to radiation oncology clinic for routine followup in one month. I advised them to call or return sooner if they have any questions or concerns related to their recovery or treatment.  -----------------------------------  Eppie Gibson, MD  This document serves as a record of services personally performed by Eppie Gibson, MD. It was created on her behalf by Bethann Humble, a trained medical scribe. The creation of this record is based on the scribe's personal observations and the provider's statements to them. This document has been checked and approved by the attending provider.

## 2017-04-09 ENCOUNTER — Ambulatory Visit (INDEPENDENT_AMBULATORY_CARE_PROVIDER_SITE_OTHER): Payer: Medicare Other | Admitting: Family Medicine

## 2017-04-09 ENCOUNTER — Encounter: Payer: Self-pay | Admitting: Family Medicine

## 2017-04-09 ENCOUNTER — Encounter (HOSPITAL_COMMUNITY): Payer: Self-pay

## 2017-04-09 ENCOUNTER — Other Ambulatory Visit: Payer: Self-pay

## 2017-04-09 ENCOUNTER — Telehealth: Payer: Self-pay | Admitting: Radiation Therapy

## 2017-04-09 ENCOUNTER — Emergency Department (HOSPITAL_COMMUNITY)
Admission: EM | Admit: 2017-04-09 | Discharge: 2017-04-09 | Disposition: A | Discharge status: Home / Self Care | Payer: Medicare Other | Attending: Emergency Medicine | Admitting: Emergency Medicine

## 2017-04-09 ENCOUNTER — Emergency Department (HOSPITAL_COMMUNITY): Payer: Medicare Other

## 2017-04-09 VITALS — BP 130/78 | HR 103 | Temp 97.9°F | Ht 65.0 in | Wt 132.2 lb

## 2017-04-09 DIAGNOSIS — Z79899 Other long term (current) drug therapy: Secondary | ICD-10-CM | POA: Insufficient documentation

## 2017-04-09 DIAGNOSIS — Z87891 Personal history of nicotine dependence: Secondary | ICD-10-CM | POA: Diagnosis not present

## 2017-04-09 DIAGNOSIS — N3000 Acute cystitis without hematuria: Secondary | ICD-10-CM

## 2017-04-09 DIAGNOSIS — J449 Chronic obstructive pulmonary disease, unspecified: Secondary | ICD-10-CM | POA: Insufficient documentation

## 2017-04-09 DIAGNOSIS — R631 Polydipsia: Secondary | ICD-10-CM | POA: Diagnosis not present

## 2017-04-09 DIAGNOSIS — Z0000 Encounter for general adult medical examination without abnormal findings: Secondary | ICD-10-CM

## 2017-04-09 DIAGNOSIS — Z923 Personal history of irradiation: Secondary | ICD-10-CM | POA: Insufficient documentation

## 2017-04-09 DIAGNOSIS — R5383 Other fatigue: Secondary | ICD-10-CM | POA: Insufficient documentation

## 2017-04-09 DIAGNOSIS — C3490 Malignant neoplasm of unspecified part of unspecified bronchus or lung: Secondary | ICD-10-CM | POA: Diagnosis not present

## 2017-04-09 DIAGNOSIS — R0602 Shortness of breath: Secondary | ICD-10-CM | POA: Diagnosis present

## 2017-04-09 DIAGNOSIS — R209 Unspecified disturbances of skin sensation: Secondary | ICD-10-CM | POA: Insufficient documentation

## 2017-04-09 DIAGNOSIS — IMO0001 Reserved for inherently not codable concepts without codable children: Secondary | ICD-10-CM

## 2017-04-09 LAB — COMPREHENSIVE METABOLIC PANEL
ALBUMIN: 3.8 g/dL (ref 3.5–5.2)
ALK PHOS: 60 U/L (ref 39–117)
ALT: 11 U/L (ref 0–35)
AST: 16 U/L (ref 0–37)
BUN: 26 mg/dL — ABNORMAL HIGH (ref 6–23)
CALCIUM: 10.3 mg/dL (ref 8.4–10.5)
CO2: 25 mEq/L (ref 19–32)
Chloride: 100 mEq/L (ref 96–112)
Creatinine, Ser: 1.34 mg/dL — ABNORMAL HIGH (ref 0.40–1.20)
GFR: 41.11 mL/min — AB (ref >60.00)
Glucose, Bld: 122 mg/dL — ABNORMAL HIGH (ref 70–99)
POTASSIUM: 4.9 meq/L (ref 3.5–5.1)
Sodium: 136 mEq/L (ref 135–145)
TOTAL PROTEIN: 7.7 g/dL (ref 6.0–8.3)
Total Bilirubin: 0.3 mg/dL (ref 0.2–1.2)

## 2017-04-09 LAB — POC URINALSYSI DIPSTICK (AUTOMATED)
BILIRUBIN UA: NEGATIVE
Blood, UA: NEGATIVE
Glucose, UA: NEGATIVE
KETONES UA: NEGATIVE
PROTEIN UA: NEGATIVE
Spec Grav, UA: 1.01 (ref 1.010–1.025)
Urobilinogen, UA: 0.2 E.U./dL
pH, UA: 7 (ref 5.0–8.0)

## 2017-04-09 LAB — URINALYSIS, ROUTINE W REFLEX MICROSCOPIC
Bilirubin Urine: NEGATIVE
GLUCOSE, UA: NEGATIVE mg/dL
Ketones, ur: NEGATIVE mg/dL
Nitrite: POSITIVE — AB
PROTEIN: NEGATIVE mg/dL
Specific Gravity, Urine: 1.006 (ref 1.005–1.030)
pH: 9 — ABNORMAL HIGH (ref 5.0–8.0)

## 2017-04-09 LAB — CBC
HEMATOCRIT: 34.3 % — AB (ref 36.0–46.0)
HEMOGLOBIN: 11.4 g/dL — AB (ref 12.0–15.0)
MCHC: 33.1 g/dL (ref 30.0–36.0)
MCV: 90.6 fl (ref 78.0–100.0)
PLATELETS: 244 10*3/uL (ref 150.0–400.0)
RBC: 3.79 Mil/uL — AB (ref 3.87–5.11)
RDW: 14.8 % (ref 11.5–15.5)
WBC: 7.4 10*3/uL (ref 4.0–10.5)

## 2017-04-09 LAB — LIPID PANEL
Cholesterol: 123 mg/dL (ref 0–200)
HDL: 37.1 mg/dL — AB (ref >39.00)
LDL Cholesterol: 57 mg/dL (ref 0–99)
NonHDL: 86.37
TRIGLYCERIDES: 146 mg/dL (ref 0.0–149.0)
Total CHOL/HDL Ratio: 3
VLDL: 29.2 mg/dL (ref 0.0–40.0)

## 2017-04-09 LAB — I-STAT TROPONIN, ED: Troponin i, poc: 0 ng/mL (ref 0.00–0.08)

## 2017-04-09 MED ORDER — NITROFURANTOIN MONOHYD MACRO 100 MG PO CAPS: 100.0000 mg | ORAL_CAPSULE | Freq: Two times a day (BID) | ORAL | 0 refills | Status: DC

## 2017-04-09 NOTE — Patient Instructions (Addendum)
I am sorry to hear about your experience with our office. I will have our office manager reach out to you regarding the issue.    We will culture your urine and let you know the results in 2 days the result.  Keep pushing fluids.  Let us know if you need anything.

## 2017-04-09 NOTE — ED Provider Notes (Signed)
[facility] DEPT Provider Note   CSN: [id number] Arrival date & time: 04/09/17  1744     History   Chief Complaint Chief Complaint  Patient presents with  . Shortness of Breath    HPI Robyn Barton is a 72 y.o. female.  She has a history of lung cancer and has completed chemo back in January and recently had radiation about 2 or 3 weeks ago.  She is complaining of what she calls side effects to her medications.  She states she has been short of breath and that is been getting worse over the last 5 days.  She is also complaining of tingling in her fingers and her toes and this is causing her difficulty in ambulating.  She states she goes through periods of where she is sleeping all the time.  She is talked to her oncologist nurse about this and she actually saw her PCP today.  There she was told she had a UTI and gave her a prescription for an antibiotic.  Patient denies any urinary symptoms.  Ultimately she states she is not coping well at home and cannot do her dishes cannot do any chores around the house.  She states she is having more difficulty walking and has been able to shop.  The history is provided by the patient.  Shortness of Breath  This is a new problem. The problem occurs frequently.The problem has been gradually worsening. Associated symptoms include cough. Pertinent negatives include no fever, no rhinorrhea, no sore throat, no neck pain, no sputum production, no hemoptysis, no chest pain, no syncope, no vomiting and no leg swelling. It is unknown what precipitated the problem. She has tried nothing for the symptoms. Associated medical issues include COPD.    Past Medical History:  Diagnosis Date  . Allergic rhinitis   . Anxiety   . Elevated TSH    pt denies  . Emphysema lung ([place])   . Enlarged lymph nodes    in chest  . GERD (gastroesophageal reflux disease)   . Goals of care, counseling/discussion 10/21/2016  . History of hiatal hernia    . Hyperlipidemia   . IFG (impaired fasting glucose)   . Osteopenia   . Recurrent major depressive disorder ([place])   . Sleep apnea    mild no cpap needed  . Small cell carcinoma of right lung ([place]) 2018   surgery right middle lobe removed   . Tobacco dependence   . Varicose veins of both lower extremities   . Vitamin D deficiency     Patient Active Problem List   Diagnosis Date Noted  . Depression, recurrent ([place]) 02/20/2017  . Anemia due to antineoplastic chemotherapy 12/18/2016  . Anxiety 11/26/2016  . Leukocytosis 11/24/2016  . Dyspnea 11/15/2016  . Goals of care, counseling/discussion 10/21/2016  . Encounter for antineoplastic chemotherapy 10/21/2016  . Mediastinal lymphadenopathy   . Small cell lung cancer, right upper lobe ([place]) 08/20/2016  . COPD (chronic obstructive pulmonary disease) ([place]) 08/20/2016  . ARDS (adult respiratory distress syndrome) ([place]) 08/20/2016    Past Surgical History:  Procedure Laterality Date  . ABDOMINAL HYSTERECTOMY     1 ovary left  . ENDOBRONCHIAL ULTRASOUND Bilateral 10/15/2016   Procedure: ENDOBRONCHIAL ULTRASOUND;  Surgeon: Collene Gobble, MD;  Location: [facility];  Service: Cardiopulmonary;  Laterality: Bilateral;  . INCONTINENCE SURGERY     bladder sling  . right middle lobe lung surgery  06/26/2016   [facility]     OB History  None      Home Medications    Prior to Admission medications   Medication Sig Start Date End Date Taking? Authorizing Provider  albuterol (PROVENTIL HFA;VENTOLIN HFA) 108 (90 Base) MCG/ACT inhaler Inhale 2 puffs into the lungs every 6 (six) hours as needed for wheezing or shortness of breath.    [provider]  alendronate (FOSAMAX) 70 MG tablet Take 70 mg by mouth every Monday. Take with a full glass of water on an empty stomach.     [provider]  atorvastatin (LIPITOR) 40 MG tablet Take 40 mg by mouth at bedtime.     [provider]  calcium carbonate (TUMS - DOSED IN  MG ELEMENTAL CALCIUM) 500 MG chewable tablet Chew 1 tablet daily as needed by mouth for indigestion or heartburn.    [provider]  Cholecalciferol (VITAMIN D) 2000 units tablet Take 2,000 Units by mouth daily.    [provider]  diphenhydramine-acetaminophen (TYLENOL PM) 25-500 MG TABS tablet Take 1 tablet at bedtime as needed by mouth (sleep).    [provider]  loperamide (IMODIUM) 2 MG capsule Take 2 mg as needed by mouth for diarrhea or loose stools.    [provider]  loratadine (CLARITIN) 10 MG tablet Take 10 mg by mouth daily as needed (hay fever).    [provider]  magnesium oxide (MAG-OX) 400 MG tablet Take 1 tablet by mouth 3 (three) times daily. 11/12/16   [provider]  nitrofurantoin, macrocrystal-monohydrate, (MACROBID) 100 MG capsule Take 1 capsule (100 mg total) by mouth 2 (two) times daily for 5 days. 04/09/17 04/14/17  Shelda Pal, DO  omeprazole (PRILOSEC) 40 MG capsule Take 40 mg by mouth daily.  08/01/16   [provider]  sertraline (ZOLOFT) 100 MG tablet Take 1 tablet (100 mg total) by mouth daily. 02/20/17   Shelda Pal, DO  tiotropium (SPIRIVA) 18 MCG inhalation capsule Place 18 mcg into inhaler and inhale daily.    [provider]    Family History Family History  Problem Relation Age of Onset  . Depression Mother   . Cancer Father   . COPD Father   . Heart disease Father   . High blood pressure Father   . High Cholesterol Father     Social History Social History   Tobacco Use  . Smoking status: Former Smoker    Packs/day: 1.50    Years: 50.00    Pack years: 75.00    Types: Cigarettes    Last attempt to quit: 06/14/2016    Years since quitting: 0.8  . Smokeless tobacco: Never Used  Substance Use Topics  . Alcohol use: Yes    Comment: rare  . Drug use: No     Allergies   Patient has no known allergies.   Review of Systems Review of Systems    Constitutional: Positive for fatigue. Negative for fever.  HENT: Positive for hearing loss. Negative for rhinorrhea and sore throat.   Respiratory: Positive for cough and shortness of breath. Negative for hemoptysis and sputum production.   Cardiovascular: Negative for chest pain, leg swelling and syncope.  Gastrointestinal: Positive for constipation and diarrhea. Negative for vomiting.  Endocrine: Positive for polydipsia.  Genitourinary: Negative for dysuria and frequency.  Musculoskeletal: Negative for back pain and neck pain.  Neurological: Positive for numbness. Negative for syncope.     Physical Exam Updated Vital Signs BP 124/68   Pulse 99   Temp 97.8 F (36.6  C) (Oral)   Resp 16   SpO2 100%   Physical Exam  Constitutional: She appears well-developed and well-nourished. No distress.  HENT:  Head: Normocephalic and atraumatic.  Eyes: Pupils are equal, round, and reactive to light. Conjunctivae and EOM are normal.  Neck: Normal range of motion. Neck supple.  Cardiovascular: Normal rate and regular rhythm.  No murmur heard. Pulmonary/Chest: Effort normal and breath sounds normal. Tachypnea noted. No respiratory distress.  Abdominal: Soft. There is no tenderness.  Musculoskeletal: Normal range of motion. She exhibits no edema.       Right lower leg: Normal. She exhibits no tenderness and no edema.       Left lower leg: Normal. She exhibits no tenderness and no edema.  Neurological: She is alert. She has normal strength. A sensory deficit (decreased fingers and feet) is present. No cranial nerve deficit. GCS eye subscore is 4. GCS verbal subscore is 5. GCS motor subscore is 6.  Skin: Skin is warm and dry. Capillary refill takes less than 2 seconds.  Psychiatric: She has a normal mood and affect.  Nursing note and vitals reviewed.    ED Treatments / Results  Labs (all labs ordered are listed, but only abnormal results are displayed) Labs Reviewed  URINALYSIS, ROUTINE W  REFLEX MICROSCOPIC - Abnormal; Notable for the following components:      Result Value   APPearance HAZY (*)    pH 9.0 (*)    Hgb urine dipstick SMALL (*)    Nitrite POSITIVE (*)    Leukocytes, UA LARGE (*)    Bacteria, UA RARE (*)    Squamous Epithelial / LPF 0-5 (*)    All other components within normal limits  URINE CULTURE  I-STAT TROPONIN, ED    EKG EKG Interpretation  Date/Time:  Wednesday April 09 2017 19:07:39 EDT Ventricular Rate:  80 PR Interval:    QRS Duration: 88 QT Interval:  361 QTC Calculation: 417 R Axis:   11 Text Interpretation:  Sinus rhythm Probable left atrial enlargement poor baseline similar to prior  Confirmed by Aletta Edouard [phone]) on 04/09/2017 7:13:43 PM Also confirmed by Aletta Edouard [phone]), editor Hattie Perch [phone])  on 04/10/2017 7:39:38 AM   Radiology Dg Chest 2 View  Result Date: 04/09/2017 CLINICAL DATA:  Shortness of breath, history of lung cancer EXAM: CHEST - 2 VIEW COMPARISON:  CT chest dated 01/31/2017 FINDINGS: Patient is rotated. Mild opacity in the right mid/lower lung, favoring scarring/postsurgical changes. Left lung is clear. No pleural effusion or pneumothorax. The heart is normal in size. Mild degenerative changes of the visualized thoracolumbar spine. IMPRESSION: Mild opacity in the right mid/lower lung, favoring scarring/postsurgical changes. No evidence of acute cardiopulmonary disease. Electronically Signed   By: Julian Hy M.D.   On: 04/09/2017 18:48    Procedures Procedures (including critical care time)  Medications Ordered in ED Medications - No data to display   Initial Impression / Assessment and Plan / ED Course  I have reviewed the triage vital signs and the nursing notes.  Pertinent labs & imaging results that were available during my care of the patient were reviewed by me and considered in my medical decision making (see chart for details).  Clinical Course as of Apr 10 1233  Wed Apr 09, 2017  [employer] Patient had blood work by her PCPs office that is in the computer for today.  Is fairly unremarkable.  She did not have a troponin so at least repeat that.   [  MB]  1936 Is here now who lives in [place].  Sounds like she was doing fairly well independently but now is at the point where she is got like a frozen meal in the house cannot do the dishes cannot do any cleaning can come out shopping and she is getting overwhelmed with all this.  He had taken her to the PCP today and commented about anxious and short of breath she was during the trip.   [MB]  2022 Discussed with the hospitalist regarding admission and he asked me if I would try social work first to see if they can send her home with services.  Talk with social work and he will talk to the son and the patient and see how we could help.   [MB]  2042 She and son are frustrated with this and are looking to be discharged.  I did talk to social work and he has been to put in a case management consult with the face-to-face and so this is been ordered.  Let the family know that if they can stick around to talk to social work this would still be a process in the hospital would contact them tomorrow.   [MB]    Clinical Course User Index [MB] Hayden Rasmussen, MD    Final Clinical Impressions(s) / ED Diagnoses   Final diagnoses:  SOB (shortness of breath)  Paresthesias/numbness  Malignant neoplasm of lung, unspecified laterality, unspecified part of lung [facility])    ED Discharge Orders    None       Hayden Rasmussen, MD 04/10/17 1236

## 2017-04-09 NOTE — Progress Notes (Signed)
Pre visit review using our clinic review tool, if applicable. No additional management support is needed unless otherwise documented below in the visit note. 

## 2017-04-09 NOTE — Discharge Instructions (Signed)
Your evaluated in the emergency department for progressive shortness of breath and numbness in your hands and feet.  It sounds like these symptoms are interfering with your ability to take care of yourself.  We did not find an obvious medical reason for the symptoms but do think it is important that you follow-up with a primary care doctor and we have also asked case management to contact you regarding home health services.  Please let your oncologist and primary care doctor know how things are going and return to the emergency department if any worsening symptoms.

## 2017-04-09 NOTE — Telephone Encounter (Signed)
Small Cell Lung [place] Completed whole brain radiation on 03/21/17. Site/dose:  Brain/ 25 Gy in 10 fractions   Ms. Guimont called today with complaints of extreme fatigue, hurting all over, numbness in her feet and fingers, and shortness of breath. Her son has offered to take her to the emergency room for an evaluation. I have encouraged Ms. Hutt to proceed with this visit with her son. It appears that she was seen by her PCP this morning. Labs were drawn, but the results are not yet available for review. Ms. Osberg went on and on about how she just feels so bad and can't take care of things. I asked if someone has been out to her home to assess her needs. She replied that she has not been seen by palliative care and there is no additional help at home. She has stopped going to the grocery store and simply can't take care of the chores around the house. She has a son, but he lives in [place].   I will make Dr. Isidore Moos and her nurse aware of this call, and will also forward it to Wadie Lessen from the palliative medicine team to check on additional resources.   Mont Dutton R.T.(R)(T) Special Procedures Navigator

## 2017-04-09 NOTE — ED Notes (Signed)
No respiratory or acute distress noted alert and oriented x 3 call light in reach able to speak in full sentences visitor at bedside.

## 2017-04-09 NOTE — Progress Notes (Signed)
Chief Complaint  Patient presents with  . Urinary Tract Infection    Robyn Barton is a 72 y.o. female here for possible UTI.  Duration: 3 weeks. Symptoms: urinary frequency and urgency Denies: hematuria, urinary hesitancy and fever Hx of recurrent UTI? Yes  ROS:  Constitutional: denies fever GU: As noted in HPI  Past Medical History:  Diagnosis Date  . Allergic rhinitis   . Anxiety   . Elevated TSH    pt denies  . Emphysema lung ([place])   . Enlarged lymph nodes    in chest  . GERD (gastroesophageal reflux disease)   . Goals of care, counseling/discussion 10/21/2016  . History of hiatal hernia   . Hyperlipidemia   . IFG (impaired fasting glucose)   . Osteopenia   . Recurrent major depressive disorder ([place])   . Sleep apnea    mild no cpap needed  . Small cell carcinoma of right lung ([place]) 2018   surgery right middle lobe removed   . Tobacco dependence   . Varicose veins of both lower extremities   . Vitamin D deficiency     BP 130/78 (BP Location: Left Arm, Patient Position: Sitting, Cuff Size: Normal)   Pulse (!) 103   Temp 97.9 F (36.6 C) (Oral)   Ht 5\' 5"  (1.651 m)   Wt 132 lb 4 oz (60 kg)   SpO2 99%   BMI 22.01 kg/m  General: Awake, alert, appears stated age HEENT: MMM Heart: RRR, no murmurs Lungs: CTAB, normal respiratory effort, no accessory muscle usage Abd: BS+, soft, +TTP suprapubic region, ND, no masses or organomegaly MSK: No CVA tenderness, neg Lloyd's sign Psych: Age appropriate judgment and insight  Acute cystitis without hematuria - Plan: nitrofurantoin, macrocrystal-monohydrate, (MACROBID) 100 MG capsule, POCT Urinalysis Dipstick (Automated), Urine Culture  Well adult exam - Plan: CBC, Comprehensive metabolic panel, Lipid panel  Orders as above. Stay hydrated. Seek immediate care if pt starts to develop fevers, new/worsening symptoms, uncontrollable N/V. F/u prn. The patient voiced understanding and agreement to the plan.  [place], DO 04/09/17 11:12 AM

## 2017-04-09 NOTE — ED Triage Notes (Signed)
Pt c/o increasing SOB and tingling in her toes and fingers over the last week. She has a hx of lung cancer and her last radiation was 2.5 weeks ago. She also reports that she went to her PCP this morning and was dx'd with a UTI and given antibiotics that she hasnt filled yet. She hopes that we can use their lab work and urine from this morning. A&Ox4. Ambulatory with assistance. She is tachypneaic in triage, but saturating well.

## 2017-04-09 NOTE — Progress Notes (Addendum)
Consult request has been received. CSW attempting to follow up at present time.  CSW spoke to the EDP who states pt presents with c/o SOB as well as tingling and numbness of the hands/feet. Pt is a cancer patient and states, per EDP, her belief that these symptoms were a side effect of her medications, per notes.  Per notes, "pt is not coping well at home and cannot do her dishes cannot do any chores around the house.  She states she is having more difficulty walking and has been (un)able to shop", per notes.  CSW staffed case and per notes there are nor acute, rehab-able injuries, fractures, etc that would be made better bby rehab and as such [employer] will not pay for above symptoms.  CSW requested EDP place a CM consult with orders for [facility] face-to-face, with orders for RN, Aide, PT/OT and [facility]) Social Work and will follow up with pt/family to inquire about needs and to offer information about options.  10:21 PM Consult request has been received. CSW attempting to follow up at present time.  CSW counseled pt's son on SNF's, ALF's and Respite Care, how they work, who is qualified to go there, insurances that do and don't pay for them and how it is paid for otherwise, as well as [place] services and how they will assist the pt in the home.  CSW provided pt's son with a list of Assisted Living Facilities, as well as area SNF's in the [place] area and counseled pt's son on lMedicaid and how it is sometimes attained..  Pt's son was appreciative and thanked the CSW.  Please reconsult if future social work needs arise.  CSW signing off, as social work intervention is no longer needed.  Alphonse Guild. Mahli Glahn, LCSW, LCAS, CSI Clinical Social Worker Ph: [phone]

## 2017-04-10 ENCOUNTER — Telehealth: Payer: Self-pay | Admitting: Emergency Medicine

## 2017-04-10 NOTE — Care Management Note (Addendum)
Case Management Note  Patient Details  Name: RAYNA BRENNER MRN: [id number] Date of Birth: [birth year]  Please see telephone encounter.  Expected Discharge Date: 04/09/2017           Expected Discharge Plan:  [place]  In-House Referral:  Clinical Social Work  Discharge planning Services  CM Consult  Post Acute Care Choice:  Home Health Choice offered to:  Patient, Adult Children  HH Arranged:  RN, PT, OT, Nurse's Aide, Speech Therapy, Social Work [employer] Agency:   [facility]  Status of Service:  Completed, signed off  Rae Mar, RN 04/10/2017, 10:25 AM

## 2017-04-10 NOTE — Telephone Encounter (Signed)
CM left a VM for pt's son Darolyn Double requesting a return call regarding [facility] agency choice.

## 2017-04-10 NOTE — Telephone Encounter (Signed)
CM contacted pt for [facility] agency choice.  Pt requested that CM speak with her son Sherren Mocha.  She doesn't believe that [facility] will help with what she needs and pt could barely speak between words due to SOB.  CM advised her the benefits of HH and she appeared open to the additional assistance temporarily.

## 2017-04-11 ENCOUNTER — Other Ambulatory Visit: Payer: Self-pay | Admitting: Family Medicine

## 2017-04-11 ENCOUNTER — Telehealth: Payer: Self-pay | Admitting: Family Medicine

## 2017-04-11 LAB — URINE CULTURE
MICRO NUMBER:: 90382278
SPECIMEN QUALITY:: ADEQUATE

## 2017-04-11 MED ORDER — CEPHALEXIN 500 MG PO CAPS: 500.0000 mg | ORAL_CAPSULE | Freq: Two times a day (BID) | ORAL | 0 refills | Status: DC

## 2017-04-11 NOTE — Telephone Encounter (Signed)
Copied from [place] [id number]. Topic: Quick Communication - See Telephone Encounter >> Apr 11, 2017  4:55 PM Arletha Grippe wrote: CRM for notification. See Telephone encounter for: 04/11/17. Horris Latino from [facility] called - they can not do start of care until Tues or Wednesday - due to staffing. they need to know if this is ok. please call [phone] If this is not ok, pt will need to be referred to another agency

## 2017-04-11 NOTE — Telephone Encounter (Addendum)
CM has not received a return call from pt's son.  CM contacted Ronalee Belts with [facility] who advised they could not start care until Monday.  CM contacted Tiffany with [facility] who advised they could start care for the pt this weekend. No further CM needs noted at this time.

## 2017-04-12 LAB — URINE CULTURE

## 2017-04-13 ENCOUNTER — Telehealth: Payer: Self-pay

## 2017-04-13 NOTE — Telephone Encounter (Signed)
Post ED Visit - Positive Culture Follow-up  Culture report reviewed by antimicrobial stewardship pharmacist:  []  Elenor Quinones, Pharm.D. []  Heide Guile, Pharm.D., BCPS AQ-ID []  Parks Neptune, Pharm.D., BCPS []  Alycia Rossetti, Pharm.D., BCPS []  [place], Pharm.D., BCPS, AAHIVP []  Legrand Como, Pharm.D., BCPS, AAHIVP []  Salome Arnt, PharmD, BCPS []  Jalene Mullet, PharmD []  Vincenza Hews, PharmD, BCPS [facility] Pharm D Positive urine culture Treated with Cephalexin, organism sensitive to the same and no further patient follow-up is required at this time.  Genia Del 04/13/2017, 10:43 AM

## 2017-04-14 ENCOUNTER — Telehealth: Payer: Self-pay | Admitting: Emergency Medicine

## 2017-04-14 NOTE — Telephone Encounter (Addendum)
CM received a return call from Rachael Fee, [phone], who apologized for not calling CM back sooner but he was wanting suggestions on how to proceed.  He reports the pt is currently staying [place] with his sister, the pt's daughter, because she is unable to be alone at this time.  CM advised him that [facility] was set up since CM had not heard back from him for choice and that they should have seen her over the weekend if she was home.  CM advised him that I would reach out to [facility] and have them contact him to see how they can assist.  CM contacted Sharyn Lull with [facility] to advise her of the situation.  She stated she will contact the necessary people to call Todd back.  She did advise that they do not cover the [facility] area where the pt is currently located.  No further CM needs noted at this time.

## 2017-04-14 NOTE — Telephone Encounter (Signed)
Is this occ therapy? I don't believe I have ordered it, but seeing how it will be tomorrow or Wed, do not think anything else will be fasting at this point. TY.

## 2017-04-15 ENCOUNTER — Telehealth: Payer: Self-pay | Admitting: Family Medicine

## 2017-04-15 NOTE — Telephone Encounter (Unsigned)
Copied from [place] [id number]. Topic: Quick Communication - See Telephone Encounter >> Apr 15, 2017  2:24 PM Percell Belt A wrote: CRM for notification. See Telephone encounter for: 04/15/17.  [place] [facility] home [facility]  She has not been able to reach pt to start care

## 2017-04-17 ENCOUNTER — Ambulatory Visit (INDEPENDENT_AMBULATORY_CARE_PROVIDER_SITE_OTHER): Payer: Medicare Other | Admitting: Family Medicine

## 2017-04-17 ENCOUNTER — Encounter: Payer: Self-pay | Admitting: Family Medicine

## 2017-04-17 VITALS — BP 102/62 | HR 97 | Temp 97.6°F | Ht 65.0 in | Wt 132.1 lb

## 2017-04-17 DIAGNOSIS — Z0001 Encounter for general adult medical examination with abnormal findings: Secondary | ICD-10-CM | POA: Diagnosis not present

## 2017-04-17 DIAGNOSIS — R202 Paresthesia of skin: Secondary | ICD-10-CM | POA: Diagnosis not present

## 2017-04-17 DIAGNOSIS — M8580 Other specified disorders of bone density and structure, unspecified site: Secondary | ICD-10-CM

## 2017-04-17 DIAGNOSIS — Z1159 Encounter for screening for other viral diseases: Secondary | ICD-10-CM

## 2017-04-17 DIAGNOSIS — Z0000 Encounter for general adult medical examination without abnormal findings: Secondary | ICD-10-CM

## 2017-04-17 DIAGNOSIS — J441 Chronic obstructive pulmonary disease with (acute) exacerbation: Secondary | ICD-10-CM

## 2017-04-17 LAB — TSH: TSH: 3.17 u[IU]/mL (ref 0.35–4.50)

## 2017-04-17 LAB — VITAMIN B12: VITAMIN B 12: 945 pg/mL — AB (ref 211–911)

## 2017-04-17 LAB — MAGNESIUM: MAGNESIUM: 1.5 mg/dL (ref 1.5–2.5)

## 2017-04-17 LAB — HEMOGLOBIN A1C: HEMOGLOBIN A1C: 6.1 % (ref 4.6–6.5)

## 2017-04-17 MED ORDER — PREDNISONE 20 MG PO TABS: 40.0000 mg | ORAL_TABLET | Freq: Every day | ORAL | 0 refills | Status: AC

## 2017-04-17 MED ORDER — IPRATROPIUM-ALBUTEROL 0.5-2.5 (3) MG/3ML IN SOLN
3.0000 mL | Freq: Once | RESPIRATORY_TRACT | Status: AC
  Administered 2017-04-17: 3 mL via RESPIRATORY_TRACT

## 2017-04-17 MED ORDER — IPRATROPIUM-ALBUTEROL 0.5-2.5 (3) MG/3ML IN SOLN: 3.0000 mL | Freq: Four times a day (QID) | RESPIRATORY_TRACT | Status: DC

## 2017-04-17 NOTE — Progress Notes (Signed)
Chief Complaint  Patient presents with  . Annual Exam     Well Woman Robyn Barton is here for a complete physical.   Her last physical was >1 year ago.  Current diet: in general, a "healthy" diet. Current exercise: Silver Sneakers. Weight is stable and she denies daytime fatigue. No LMP recorded. Patient has had a hysterectomy..  Seatbelt? Yes  Health Maintenance Colonoscopy- Yes- has appt coming up DEXA- >2 yrs ago Mammogram- has known breast cancer Tetanus- Yes Pneumonia- Yes Hep C screen- No  Follows with GYN for women's health  Has been having SOB, URI s/s's for past 2-3 weeks. Hx of COPD, has been using albuterol inhaler more freq, it does help. Denies any fevers.  She has a history of allergies, ran out of Claritin.  She did take her last tab around 1 week ago that was helpful.  She has been having watery eyes, ear fullness, runny/stuffy nose, wheezing, shortness of breath/dyspnea on exertion.  She also has a history of Small cell lung cancer for which she received chest radiation.  She wonders if this could be related to that also.  She has a long-standing history of numbness/tingling in her both of her feet.  It has recently worsened where it feels uncomfortable and is affecting her walking.  There is no injury or change in activity.  No skin changes over the area.  Past Medical History:  Diagnosis Date  . Allergic rhinitis   . Anxiety   . Elevated TSH    pt denies  . Emphysema lung ([place])   . Enlarged lymph nodes    in chest  . GERD (gastroesophageal reflux disease)   . Goals of care, counseling/discussion 10/21/2016  . History of hiatal hernia   . Hyperlipidemia   . IFG (impaired fasting glucose)   . Osteopenia   . Recurrent major depressive disorder ([place])   . Sleep apnea    mild no cpap needed  . 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 Tobacco dependence   . Varicose veins of both lower extremities   . Vitamin D  deficiency      Past Surgical History:  Procedure Laterality Date  . ABDOMINAL HYSTERECTOMY     1 ovary left  . ENDOBRONCHIAL ULTRASOUND Bilateral 10/15/2016   Procedure: ENDOBRONCHIAL ULTRASOUND;  Surgeon: Collene Gobble, MD;  Location: [facility];  Service: Cardiopulmonary;  Laterality: Bilateral;  . INCONTINENCE SURGERY     bladder sling  . right middle lobe lung surgery  06/26/2016   [facility]    Medications  Current Outpatient Medications on File Prior to Visit  Medication Sig Dispense Refill  . albuterol (PROVENTIL HFA;VENTOLIN HFA) 108 (90 Base) MCG/ACT inhaler Inhale 2 puffs into the lungs every 6 (six) hours as needed for wheezing or shortness of breath.    Marland Kitchen alendronate (FOSAMAX) 70 MG tablet Take 70 mg by mouth every Monday. Take with a full glass of water on an empty stomach.     Marland Kitchen atorvastatin (LIPITOR) 40 MG tablet Take 40 mg by mouth at bedtime.     . calcium carbonate (TUMS - DOSED IN MG ELEMENTAL CALCIUM) 500 MG chewable tablet Chew 1 tablet daily as needed by mouth for indigestion or heartburn.    . Cholecalciferol (VITAMIN D) 2000 units tablet Take 2,000 Units by mouth daily.    . diphenhydramine-acetaminophen (TYLENOL PM) 25-500 MG TABS tablet Take 1 tablet at bedtime as needed  by mouth (sleep).    Marland Kitchen loperamide (IMODIUM) 2 MG capsule Take 2 mg as needed by mouth for diarrhea or loose stools.    Marland Kitchen loratadine (CLARITIN) 10 MG tablet Take 10 mg by mouth daily as needed (hay fever).    Marland Kitchen omeprazole (PRILOSEC) 40 MG capsule Take 40 mg by mouth daily.   3  . sertraline (ZOLOFT) 100 MG tablet Take 1 tablet (100 mg total) by mouth daily. 90 tablet 3  . tiotropium (SPIRIVA) 18 MCG inhalation capsule Place 18 mcg into inhaler and inhale daily.      Allergies No Known Allergies  Review of Systems: Constitutional:  no fevers, sweats, or chills Eye:  no recent significant change in vision Ear/Nose/Mouth/Throat:  Ears:  No changes in hearing, Nose/Mouth/Throat:  +nasal  congestion, no sore throat Cardiovascular: +chest wall pain (2/2 radiation) Respiratory:  No cough and no shortness of breath Gastrointestinal:  no abdominal pain GU:  Female: negative for dysuria, frequency, and incontinence; no abnormal bleeding, pelvic pain, or discharge Musculoskeletal/Extremities:  no pain of the joints Integumentary (Skin/Breast):  no abnormal skin lesions reported Neurologic: +Paresthesias over feet Psychiatric:  no anxiety, no depression Endocrine:  denies unexplained wt loss Hematologic/Lymphatic:  no abnormal bleeding  Exam BP 102/62 (BP Location: Left Arm, Patient Position: Sitting, Cuff Size: Normal)   Pulse 97   Temp 97.6 F (36.4 C) (Oral)   Ht 5\' 5"  (1.651 m)   Wt 132 lb 2 oz (59.9 kg)   SpO2 97%   BMI 21.99 kg/m  General:  well developed, well nourished, in no apparent distress Skin:  no significant moles, warts, or growths Head:  no masses, lesions, or tenderness Eyes:  pupils equal and round, sclera anicteric without injection Ears:  canals without lesions, TMs shiny without retraction, no obvious effusion, no erythema Nose:  nares patent, septum midline, mucosa normal, and no drainage or sinus tenderness Throat/Pharynx:  lips and gingiva without lesion; tongue and uvula midline; non-inflamed pharynx; no exudates or postnasal drainage Neck: neck supple without adenopathy, thyromegaly, or masses Breasts:  Not done Thorax:  nontender Lungs:  +intermittent conversational dyspnea, +diffuse wheezing, no access msc use Cardio:  regular rate and rhythm without murmurs, heart sounds without clicks or rubs, point of maximal impulse normal; no lifts, heaves, or thrills Abdomen:  abdomen soft, nontender; bowel sounds normal; no masses or organomegaly Genital: Deferred Musculoskeletal:  symmetrical muscle groups noted without atrophy or deformity Extremities:  no clubbing, cyanosis, or edema, no deformities, no skin discoloration Neuro:  gait normal; deep  tendon reflexes normal and symmetric Psych: well oriented with normal range of affect and appropriate judgment/insight  Assessment and Plan  Well adult exam  COPD exacerbation ([place]) - Plan: ipratropium-albuterol (DUONEB) 0.5-2.5 (3) MG/3ML nebulizer solution 3 mL, DISCONTINUED: ipratropium-albuterol (DUONEB) 0.5-2.5 (3) MG/3ML nebulizer solution 3 mL  Encounter for hepatitis C screening test for low risk patient - Plan: Hepatitis C antibody  Paresthesia of both feet - Plan: B12, Magnesium, Hemoglobin A1c, TSH  Osteopenia, unspecified location - Plan: DG Bone Density   Well 72 y.o. female. Counseled on diet and exercise. Patient reported improvement after the breathing treatment.  Will start oral steroids as well.  Check labs for paresthesias. She is due for a bone density scan. Other orders as above. Follow up in 6 mo pending the above workup. The patient voiced understanding and agreement to the plan.  [place], DO 04/17/17 11:48 AM

## 2017-04-17 NOTE — Patient Instructions (Addendum)
Claritin (loratadine), Allegra (fexofenadine), Zyrtec (cetirizine); these are listed in order from weakest to strongest. Generic, and therefore cheaper, options are in the parentheses.   Flonase (fluticasone); nasal spray that is over the counter. 2 sprays each nostril, once daily. Aim towards the same side eye when you spray.  There are available OTC, and the generic versions, which may be cheaper, are in parentheses. Show this to a pharmacist if you have trouble finding any of these items.  Consider increasing your salt intake to help with your blood pressure. Stay well hydrated.  Keep using albuterol as needed.  2-3 days to get the results of your labs back.   Let us know if you need anything.

## 2017-04-17 NOTE — Progress Notes (Signed)
Pre visit review using our clinic review tool, if applicable. No additional management support is needed unless otherwise documented below in the visit note. 

## 2017-04-18 LAB — HEPATITIS C ANTIBODY
HEP C AB: NONREACTIVE
SIGNAL TO CUT-OFF: 0.02 (ref <1.00)

## 2017-04-22 ENCOUNTER — Telehealth: Payer: Self-pay | Admitting: Family Medicine

## 2017-04-22 ENCOUNTER — Telehealth: Payer: Self-pay | Admitting: Emergency Medicine

## 2017-04-22 MED ORDER — TIOTROPIUM BROMIDE MONOHYDRATE 18 MCG IN CAPS: 18.0000 ug | ORAL_CAPSULE | Freq: Every day | RESPIRATORY_TRACT | 3 refills | Status: DC

## 2017-04-22 MED ORDER — ALBUTEROL SULFATE HFA 108 (90 BASE) MCG/ACT IN AERS: 2.0000 | INHALATION_SPRAY | Freq: Four times a day (QID) | RESPIRATORY_TRACT | 3 refills | Status: DC | PRN

## 2017-04-22 NOTE — Telephone Encounter (Signed)
Copied from [place]. Topic: General - Other >> Apr 22, 2017 12:22 PM Valla Leaver wrote: Reason for CRM: Constance Haw, PT, with [facility] calling to get verbal okay for PT and RN services with frequency 2x a wk for 4wks.

## 2017-04-22 NOTE — Telephone Encounter (Signed)
Copied from [place]. Topic: Quick Communication - Rx Refill/Question >> Apr 22, 2017  3:04 PM Carolyn Stare wrote: Medication tiotropium (SPIRIVA) 18 MCG inhalation capsule,  albuterol (PROVENTIL HFA;VENTOLIN HFA) 108 (90 Base) MCG/ACT inhaler  Preferred Pharmacy   [facility]   [address] [place]   Agent: Please be advised that RX refills may take up to 3 business days. We ask that you follow-up with your pharmacy.

## 2017-04-22 NOTE — Telephone Encounter (Signed)
Called and spoke to patient. Patient stated that her PCP had been prescribing her respiratory medications but that she has been seeing Dr. Lamonte Sakai and changed PCPs since being diagnosed with cancer. Per last OV note with RB patient was to continue Spiriva and Proair rescue inhaler.  Rx sent to patient's preferred pharmacy. Nothing further is needed at this time.

## 2017-04-23 ENCOUNTER — Encounter: Payer: Self-pay | Admitting: Radiation Oncology

## 2017-04-23 ENCOUNTER — Telehealth: Payer: Self-pay | Admitting: Family Medicine

## 2017-04-23 MED ORDER — ALBUTEROL SULFATE HFA 108 (90 BASE) MCG/ACT IN AERS: 2.0000 | INHALATION_SPRAY | Freq: Four times a day (QID) | RESPIRATORY_TRACT | 3 refills | Status: DC | PRN

## 2017-04-23 MED ORDER — TIOTROPIUM BROMIDE MONOHYDRATE 18 MCG IN CAPS: 18.0000 ug | ORAL_CAPSULE | Freq: Every day | RESPIRATORY_TRACT | 3 refills | Status: DC

## 2017-04-23 NOTE — Telephone Encounter (Signed)
Copied from [place] [id number]. Topic: Quick Communication - See Telephone Encounter >> Apr 23, 2017  3:04 PM Vernona Rieger wrote: CRM for notification. See Telephone encounter for: 04/23/17.  Will from [facility] needs verbals for OT 2 times a week for two weeks. Call back is [phone]

## 2017-04-23 NOTE — Telephone Encounter (Signed)
CALLED LEFT DETAILED MESSAGE OF VERBAL OK PER PCP

## 2017-04-23 NOTE — Telephone Encounter (Signed)
Called [facility] informed ok per PCP

## 2017-04-25 ENCOUNTER — Encounter: Payer: Self-pay | Admitting: Radiation Oncology

## 2017-04-25 ENCOUNTER — Ambulatory Visit
Admission: RE | Admit: 2017-04-25 | Discharge: 2017-04-25 | Disposition: A | Discharge status: Home / Self Care | Payer: Medicare Other | Source: Ambulatory Visit | Attending: Radiation Oncology | Admitting: Radiation Oncology

## 2017-04-25 ENCOUNTER — Other Ambulatory Visit: Payer: Self-pay

## 2017-04-25 VITALS — BP 109/60 | HR 93 | Temp 97.7°F | Resp 18 | Ht 65.0 in | Wt 130.6 lb

## 2017-04-25 DIAGNOSIS — J441 Chronic obstructive pulmonary disease with (acute) exacerbation: Secondary | ICD-10-CM | POA: Insufficient documentation

## 2017-04-25 DIAGNOSIS — C3411 Malignant neoplasm of upper lobe, right bronchus or lung: Secondary | ICD-10-CM | POA: Diagnosis not present

## 2017-04-25 DIAGNOSIS — Z923 Personal history of irradiation: Secondary | ICD-10-CM | POA: Diagnosis not present

## 2017-04-25 HISTORY — DX: Personal history of irradiation: Z92.3

## 2017-04-25 NOTE — Progress Notes (Addendum)
Nursing notes by Harlow Asa RN: Robyn Barton presents after prophylactic brain radiation completed 03/21/17. She denies pain except occasional sharp pain to the area that received radiation over her lungs. She reports eating well. She reports balance issues related numbness in her bilateral feet. She also has tingling to her fingers. She reports congestion to her entire head. She feels like she is talking in a tunnel. She cannot hear very well. She reports severe fatigue that continues after receiving brain radiation. She has shortness of breath with any type of activity. She has noticed wheezing and began using her inhaler and taking muccinex. She has had several UTI's.   BP 109/60   Pulse 93   Temp 97.7 F (36.5 C)   Resp 18   Ht 5\' 5"  (1.651 m)   Wt 130 lb 9.6 oz (59.2 kg)   SpO2 98% Comment: room air  BMI 21.73 kg/m    Wt Readings from Last 3 Encounters:  04/25/17 130 lb 9.6 oz (59.2 kg)  04/17/17 132 lb 2 oz (59.9 kg)  04/09/17 132 lb 4 oz (60 kg)

## 2017-04-25 NOTE — Progress Notes (Signed)
Radiation Oncology         [phone]) [phone] ________________________________  Name: Robyn Barton MRN: [id number]  Date: 04/25/2017  DOB: [birth year]  Follow-Up Visit Note  Outpatient  CC: Shelda Pal, DO  [name], MD  Diagnosis and Prior Radiotherapy:    ICD-10-CM   1. Small cell lung cancer, right upper lobe ([place]) C34.11     03/10/17-03/21/17: Whole Brain / 25 Gy in 10 fractions, Isodose plan/ 6X 11/18/16-01/16/17: 59.4 Gy to the lung in 33 fractions  CHIEF COMPLAINT: Here for follow-up and surveillance of lung cancer  Narrative:  The patient returns today for routine follow-up after prophylactic brain radiation completed 03/21/2017. She is scheduled for CT Chest on 05/02/2017 and is scheduled to follow up with Dr. [name] on 05/05/2017.  On review of systems, she denies pain except occasional sharp pain to the area that received radiation over her lungs. She reports eating well. She reports balance issues related numbness in her bilateral feet. She will be seeing a physical therapist in her home for this. She also has tingling to her fingers. She reports congestion to her entire head. She feels like she is "talking in a tunnel". She cannot hear very well. She reports severe fatigue that continues after receiving brain radiation. She does admit the fatigue has gradually been getting better. She has shortness of breath with any type of activity. She has noticed wheezing and began using her inhaler and taking Mucinex. She reports the SOB improved with inhalers and oral prednisone recently Rxd by her PCP. She is also using Flonase and Claritin for seasonal allergies. She has had several UTI's.                               ALLERGIES:  has No Known Allergies.  Meds: Current Outpatient Medications  Medication Sig Dispense Refill  . albuterol (PROVENTIL HFA;VENTOLIN HFA) 108 (90 Base) MCG/ACT inhaler Inhale 2 puffs into the lungs every 6 (six) hours as needed for wheezing or  shortness of breath. 1 Inhaler 3  . alendronate (FOSAMAX) 70 MG tablet Take 70 mg by mouth every Monday. Take with a full glass of water on an empty stomach.     Marland Kitchen atorvastatin (LIPITOR) 40 MG tablet Take 40 mg by mouth at bedtime.     . calcium carbonate (TUMS - DOSED IN MG ELEMENTAL CALCIUM) 500 MG chewable tablet Chew 1 tablet daily as needed by mouth for indigestion or heartburn.    . Cholecalciferol (VITAMIN D) 2000 units tablet Take 2,000 Units by mouth daily.    . diphenhydramine-acetaminophen (TYLENOL PM) 25-500 MG TABS tablet Take 1 tablet at bedtime as needed by mouth (sleep).    Marland Kitchen loperamide (IMODIUM) 2 MG capsule Take 2 mg as needed by mouth for diarrhea or loose stools.    Marland Kitchen loratadine (CLARITIN) 10 MG tablet Take 10 mg by mouth daily as needed (hay fever).    Marland Kitchen omeprazole (PRILOSEC) 40 MG capsule Take 40 mg by mouth daily.   3  . sertraline (ZOLOFT) 100 MG tablet Take 1 tablet (100 mg total) by mouth daily. 90 tablet 3  . tiotropium (SPIRIVA HANDIHALER) 18 MCG inhalation capsule Place 1 capsule (18 mcg total) into inhaler and inhale daily. 30 capsule 3  . tiotropium (SPIRIVA) 18 MCG inhalation capsule Place 18 mcg into inhaler and inhale daily.     No current facility-administered medications for this encounter.  Physical Findings: The patient is in no acute distress. Patient is alert and oriented.  height is 5\' 5"  (1.651 m) and weight is 130 lb 9.6 oz (59.2 kg). Her temperature is 97.7 F (36.5 C). Her blood pressure is 109/60 and her pulse is 93. Her respiration is 18 and oxygen saturation is 98%. .    General: Alert and oriented, in no acute distress HEENT: Head is normocephalic. Extraocular movements are intact. Oropharynx is clear. Tympanic membranes look unremarkable. Neck: Neck is supple, no palpable cervical or supraclavicular lymphadenopathy. Heart: Regular in rate and rhythm with no murmurs, rubs, or gallops. Chest: Scattered wheezes through lungs  bilaterally. Abdomen: Soft, nontender, nondistended, with no rigidity or guarding. Extremities: No cyanosis or edema. Lymphatics: see Neck Exam Skin: No concerning lesions. Musculoskeletal: symmetric strength and muscle tone throughout. Neurologic: Cranial nerves II through XII are grossly intact. No obvious focalities. Speech is fluent.  Psychiatric: Judgment and insight are intact. Affect is appropriate.   Lab Findings: Lab Results  Component Value Date   WBC 7.4 04/09/2017   HGB 11.4 (L) 04/09/2017   HCT 34.3 (L) 04/09/2017   MCV 90.6 04/09/2017   PLT 244.0 04/09/2017    Radiographic Findings: Dg Chest 2 View  Result Date: 04/09/2017 CLINICAL DATA:  Shortness of breath, history of lung cancer EXAM: CHEST - 2 VIEW COMPARISON:  CT chest dated 01/31/2017 FINDINGS: Patient is rotated. Mild opacity in the right mid/lower lung, favoring scarring/postsurgical changes. Left lung is clear. No pleural effusion or pneumothorax. The heart is normal in size. Mild degenerative changes of the visualized thoracolumbar spine. IMPRESSION: Mild opacity in the right mid/lower lung, favoring scarring/postsurgical changes. No evidence of acute cardiopulmonary disease. Electronically Signed   By: Julian Hy M.D.   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 Radatiion pneumonitis is also on the differential.  She also took oral steroids with good effect, which is helpful for both COPD exacerbations and pneumonitis from RT.  She has an upcoming CT Chest to verify if there is significant residual pneumonitis, soon. Will see med onc at that time.  I will order repeat Brain MRI in 3 months and we will follow up to discuss these results. (she prefers this  to be in 3 mo rather than 2 mo, to minimize procedures, and I think that is fine).   _____________________________________   Eppie Gibson, MD   This document serves as a record of services personally performed by Eppie Gibson, MD. It was created on her behalf by Arlyce Harman, a trained medical scribe. The creation of this record is based on the scribe's personal observations and the provider's statements to them. This document has been checked and approved by the attending provider.

## 2017-04-26 ENCOUNTER — Encounter: Payer: Self-pay | Admitting: Radiation Oncology

## 2017-04-28 ENCOUNTER — Telehealth: Payer: Self-pay | Admitting: *Deleted

## 2017-04-28 ENCOUNTER — Other Ambulatory Visit: Payer: Self-pay | Admitting: Internal Medicine

## 2017-04-28 DIAGNOSIS — C3411 Malignant neoplasm of upper lobe, right bronchus or lung: Secondary | ICD-10-CM

## 2017-04-28 NOTE — Telephone Encounter (Signed)
Received Home Health Certification and Plan of Care; forwarded to provider/SLS 04/15

## 2017-05-02 ENCOUNTER — Ambulatory Visit (HOSPITAL_COMMUNITY): Payer: Medicare Other

## 2017-05-02 ENCOUNTER — Other Ambulatory Visit: Payer: Self-pay | Admitting: Radiation Therapy

## 2017-05-02 ENCOUNTER — Encounter (HOSPITAL_BASED_OUTPATIENT_CLINIC_OR_DEPARTMENT_OTHER): Payer: Self-pay

## 2017-05-02 ENCOUNTER — Inpatient Hospital Stay: Payer: Medicare Other | Attending: Internal Medicine

## 2017-05-02 ENCOUNTER — Ambulatory Visit (HOSPITAL_BASED_OUTPATIENT_CLINIC_OR_DEPARTMENT_OTHER)
Admission: RE | Admit: 2017-05-02 | Discharge: 2017-05-02 | Disposition: A | Discharge status: Home / Self Care | Payer: Medicare Other | Source: Ambulatory Visit | Attending: Internal Medicine | Admitting: Internal Medicine

## 2017-05-02 DIAGNOSIS — Z9221 Personal history of antineoplastic chemotherapy: Secondary | ICD-10-CM | POA: Insufficient documentation

## 2017-05-02 DIAGNOSIS — C3411 Malignant neoplasm of upper lobe, right bronchus or lung: Secondary | ICD-10-CM | POA: Diagnosis not present

## 2017-05-02 DIAGNOSIS — Y842 Radiological procedure and radiotherapy as the cause of abnormal reaction of the patient, or of later complication, without mention of misadventure at the time of the procedure: Secondary | ICD-10-CM | POA: Insufficient documentation

## 2017-05-02 DIAGNOSIS — Z923 Personal history of irradiation: Secondary | ICD-10-CM | POA: Insufficient documentation

## 2017-05-02 DIAGNOSIS — I700 Atherosclerosis of aorta: Secondary | ICD-10-CM | POA: Diagnosis not present

## 2017-05-02 DIAGNOSIS — J439 Emphysema, unspecified: Secondary | ICD-10-CM | POA: Insufficient documentation

## 2017-05-02 DIAGNOSIS — G629 Polyneuropathy, unspecified: Secondary | ICD-10-CM | POA: Insufficient documentation

## 2017-05-02 DIAGNOSIS — Z79899 Other long term (current) drug therapy: Secondary | ICD-10-CM | POA: Diagnosis not present

## 2017-05-02 DIAGNOSIS — C7949 Secondary malignant neoplasm of other parts of nervous system: Principal | ICD-10-CM

## 2017-05-02 DIAGNOSIS — C7931 Secondary malignant neoplasm of brain: Secondary | ICD-10-CM

## 2017-05-02 LAB — CBC WITH DIFFERENTIAL/PLATELET
BASOS ABS: 0 10*3/uL (ref 0.0–0.1)
Basophils Relative: 0 %
Eosinophils Absolute: 0.1 10*3/uL (ref 0.0–0.5)
Eosinophils Relative: 2 %
HEMATOCRIT: 33.8 % — AB (ref 34.8–46.6)
Hemoglobin: 11.2 g/dL — ABNORMAL LOW (ref 11.6–15.9)
LYMPHS ABS: 0.7 10*3/uL — AB (ref 0.9–3.3)
LYMPHS PCT: 10 %
MCH: 29.5 pg (ref 26.0–34.0)
MCHC: 33.1 g/dL (ref 32.0–36.0)
MCV: 88.9 fL (ref 81.0–101.0)
MONO ABS: 0.7 10*3/uL (ref 0.1–0.9)
MONOS PCT: 10 %
NEUTROS ABS: 5.6 10*3/uL (ref 1.5–6.5)
Neutrophils Relative %: 78 %
Platelets: 187 10*3/uL (ref 145–400)
RBC: 3.8 MIL/uL (ref 3.70–5.32)
RDW: 14.8 % (ref 11.1–15.7)
WBC: 7.2 10*3/uL (ref 3.9–10.0)

## 2017-05-02 LAB — COMPREHENSIVE METABOLIC PANEL
ALT: 29 U/L (ref 10–47)
ANION GAP: 7 (ref 5–15)
AST: 20 U/L (ref 11–38)
Albumin: 3.4 g/dL — ABNORMAL LOW (ref 3.5–5.0)
Alkaline Phosphatase: 61 U/L (ref 26–84)
BILIRUBIN TOTAL: 0.6 mg/dL (ref 0.2–1.6)
BUN: 19 mg/dL (ref 7–22)
CALCIUM: 9.9 mg/dL (ref 8.0–10.3)
CO2: 26 mmol/L (ref 18–33)
Chloride: 107 mmol/L (ref 98–108)
Creatinine, Ser: 1.4 mg/dL — ABNORMAL HIGH (ref 0.60–1.20)
Glucose, Bld: 102 mg/dL (ref 73–118)
Potassium: 4.1 mmol/L (ref 3.3–4.7)
Sodium: 140 mmol/L (ref 128–145)
Total Protein: 7.3 g/dL (ref 6.4–8.1)

## 2017-05-02 LAB — MAGNESIUM: Magnesium: 1.5 mg/dL — ABNORMAL LOW (ref 1.7–2.4)

## 2017-05-02 MED ORDER — IOPAMIDOL (ISOVUE-300) INJECTION 61%
100.0000 mL | Freq: Once | INTRAVENOUS | Status: AC | PRN
  Administered 2017-05-02: 75 mL via INTRAVENOUS

## 2017-05-05 ENCOUNTER — Telehealth: Payer: Self-pay | Admitting: Internal Medicine

## 2017-05-05 ENCOUNTER — Encounter: Payer: Self-pay | Admitting: Internal Medicine

## 2017-05-05 ENCOUNTER — Inpatient Hospital Stay (HOSPITAL_BASED_OUTPATIENT_CLINIC_OR_DEPARTMENT_OTHER): Payer: Medicare Other | Admitting: Internal Medicine

## 2017-05-05 VITALS — BP 117/72 | HR 89 | Temp 97.9°F | Resp 20 | Ht 65.0 in | Wt 130.7 lb

## 2017-05-05 DIAGNOSIS — J439 Emphysema, unspecified: Secondary | ICD-10-CM | POA: Diagnosis not present

## 2017-05-05 DIAGNOSIS — Z923 Personal history of irradiation: Secondary | ICD-10-CM

## 2017-05-05 DIAGNOSIS — Z9221 Personal history of antineoplastic chemotherapy: Secondary | ICD-10-CM

## 2017-05-05 DIAGNOSIS — G629 Polyneuropathy, unspecified: Secondary | ICD-10-CM | POA: Diagnosis not present

## 2017-05-05 DIAGNOSIS — Z79899 Other long term (current) drug therapy: Secondary | ICD-10-CM

## 2017-05-05 DIAGNOSIS — C3411 Malignant neoplasm of upper lobe, right bronchus or lung: Secondary | ICD-10-CM

## 2017-05-05 DIAGNOSIS — R202 Paresthesia of skin: Secondary | ICD-10-CM

## 2017-05-05 NOTE — Telephone Encounter (Signed)
Scheduled appt per 4/22 los - lab/ct and f/u in 3 months - sent reminder letter in the mail with appt date and time.

## 2017-05-05 NOTE — Progress Notes (Signed)
[facility] [phone]   [phone]  OFFICE PROGRESS NOTE  Shelda Pal, DO [address] [place] [address]  DIAGNOSIS: Recurrent small cell lung cancer initially diagnosed as Limited stage (T1a, N0, M0) small cell lung cancer diagnosed in June 2018.  PRIOR THERAPY:  1) right upper lobectomy on 06/26/2016 and the final pathology was consistent with small cell lung cancer measuring 1.6 cm in size. 2) systemic chemotherapy with cisplatin 60 MG/M2 on day 1 and etoposide 120 MG/M2 on days 1, 2 and 3 every 3 weeks.  Status post 4 cycles.  This was concurrent with radiation. 3) prophylactic cranial irradiation under the care of Dr. Isidore Moos.  CURRENT THERAPY: Observation.  INTERVAL HISTORY: Robyn Barton 72 y.o. female returns to the clinic today for 3 months follow-up visit the patient is feeling fine today with no specific complaints except for shortness of breath with exertion as well as wheezing.  She has seasonal allergy.  She is using inhaler as prescribed by Dr. Lamonte Sakai.  She denied having any chest pain, cough or hemoptysis.  She continues to have mild numbness and tingling in the fingers and toes.  She has no recent weight loss or night sweats.  She has no nausea, vomiting, diarrhea or constipation.  She feels a little bit more weaker and fatigued started after the brain radiation.  The patient had repeat CT scan of the chest performed recently and she is here for evaluation and discussion of her risk her results.  MEDICAL HISTORY: Past Medical History:  Diagnosis Date  . Allergic rhinitis   . Anxiety   . Elevated TSH    pt denies  . Emphysema lung ([place])   . Enlarged lymph nodes    in chest  . GERD (gastroesophageal reflux disease)   . Goals of care, counseling/discussion 10/21/2016  . History of hiatal hernia   . History of radiation therapy 03/10/17- 03/21/17   Whole brain, prophylactic, radiation 25 Gy in 10  fractions  . Hyperlipidemia   . IFG (impaired fasting glucose)   . Osteopenia   . Recurrent major depressive disorder ([place])   . Sleep apnea    mild no cpap needed  . Small cell carcinoma of right lung ([place]) 2018   surgery right middle lobe removed   . Tobacco dependence   . Varicose veins of both lower extremities   . Vitamin D deficiency     ALLERGIES:  has No Known Allergies.  MEDICATIONS:  Current Outpatient Medications  Medication Sig Dispense Refill  . albuterol (PROVENTIL HFA;VENTOLIN HFA) 108 (90 Base) MCG/ACT inhaler Inhale 2 puffs into the lungs every 6 (six) hours as needed for wheezing or shortness of breath. 1 Inhaler 3  . alendronate (FOSAMAX) 70 MG tablet Take 70 mg by mouth every Monday. Take with a full glass of water on an empty stomach.     Marland Kitchen atorvastatin (LIPITOR) 40 MG tablet Take 40 mg by mouth at bedtime.     . calcium carbonate (TUMS - DOSED IN MG ELEMENTAL CALCIUM) 500 MG chewable tablet Chew 1 tablet daily as needed by mouth for indigestion or heartburn.    . Cholecalciferol (VITAMIN D) 2000 units tablet Take 2,000 Units by mouth daily.    . diphenhydramine-acetaminophen (TYLENOL PM) 25-500 MG TABS tablet Take 1 tablet at bedtime as needed by mouth (sleep).    Marland Kitchen loperamide (IMODIUM) 2 MG capsule Take 2 mg as needed by  mouth for diarrhea or loose stools.    Marland Kitchen loratadine (CLARITIN) 10 MG tablet Take 10 mg by mouth daily as needed (hay fever).    Marland Kitchen omeprazole (PRILOSEC) 40 MG capsule Take 40 mg by mouth daily.   3  . sertraline (ZOLOFT) 100 MG tablet Take 1 tablet (100 mg total) by mouth daily. 90 tablet 3  . tiotropium (SPIRIVA HANDIHALER) 18 MCG inhalation capsule Place 1 capsule (18 mcg total) into inhaler and inhale daily. 30 capsule 3  . tiotropium (SPIRIVA) 18 MCG inhalation capsule Place 18 mcg into inhaler and inhale daily.     No current facility-administered medications for this visit.     SURGICAL HISTORY:  Past Surgical History:  Procedure  Laterality Date  . ABDOMINAL HYSTERECTOMY     1 ovary left  . ENDOBRONCHIAL ULTRASOUND Bilateral 10/15/2016   Procedure: ENDOBRONCHIAL ULTRASOUND;  Surgeon: Collene Gobble, MD;  Location: [facility];  Service: Cardiopulmonary;  Laterality: Bilateral;  . INCONTINENCE SURGERY     bladder sling  . right middle lobe lung surgery  06/26/2016   [facility]    REVIEW OF SYSTEMS:  A comprehensive review of systems was negative except for: Constitutional: positive for fatigue Respiratory: positive for dyspnea on exertion and wheezing Neurological: positive for paresthesia   PHYSICAL EXAMINATION: General appearance: alert, cooperative, fatigued and no distress Head: Normocephalic, without obvious abnormality, atraumatic Neck: no adenopathy, no JVD, supple, symmetrical, trachea midline and thyroid not enlarged, symmetric, no tenderness/mass/nodules Lymph nodes: Cervical, supraclavicular, and axillary nodes normal. Resp: clear to auscultation bilaterally Back: symmetric, no curvature. ROM normal. No CVA tenderness. Cardio: regular rate and rhythm, S1, S2 normal, no murmur, click, rub or gallop GI: soft, non-tender; bowel sounds normal; no masses,  no organomegaly Extremities: extremities normal, atraumatic, no cyanosis or edema  ECOG PERFORMANCE STATUS: 1 - Symptomatic but completely ambulatory  Blood pressure 117/72, pulse 89, temperature 97.9 F (36.6 C), temperature source Oral, resp. rate 20, height 5\' 5"  (1.651 m), weight 130 lb 11.2 oz (59.3 kg), SpO2 98 %.  LABORATORY DATA: Lab Results  Component Value Date   WBC 7.2 05/02/2017   HGB 11.2 (L) 05/02/2017   HCT 33.8 (L) 05/02/2017   MCV 88.9 05/02/2017   PLT 187 05/02/2017      Chemistry      Component Value Date/Time   NA 140 05/02/2017 1129   NA 135 (L) 01/10/2017 1045   K 4.1 05/02/2017 1129   K 4.9 01/10/2017 1045   CL 107 05/02/2017 1129   CO2 26 05/02/2017 1129   CO2 20 (L) 01/10/2017 1045   BUN 19 05/02/2017 1129    BUN 27.6 (H) 01/10/2017 1045   CREATININE 1.40 (H) 05/02/2017 1129   CREATININE 1.4 (H) 01/10/2017 1045      Component Value Date/Time   CALCIUM 9.9 05/02/2017 1129   CALCIUM 9.7 01/10/2017 1045   ALKPHOS 61 05/02/2017 1129   ALKPHOS 77 01/10/2017 1045   AST 20 05/02/2017 1129   AST 10 01/10/2017 1045   ALT 29 05/02/2017 1129   ALT 11 01/10/2017 1045   BILITOT 0.6 05/02/2017 1129   BILITOT 0.34 01/10/2017 1045       RADIOGRAPHIC STUDIES: Dg Chest 2 View  Result Date: 04/09/2017 CLINICAL DATA:  Shortness of breath, history of lung cancer EXAM: CHEST - 2 VIEW COMPARISON:  CT chest dated 01/31/2017 FINDINGS: Patient is rotated. Mild opacity in the right mid/lower lung, favoring scarring/postsurgical changes. Left lung is clear. No pleural effusion or pneumothorax.  The heart is normal in size. Mild degenerative changes of the visualized thoracolumbar spine. IMPRESSION: Mild opacity in the right mid/lower lung, favoring scarring/postsurgical changes. No evidence of acute cardiopulmonary disease. Electronically Signed   By: Julian Hy M.D.   On: 04/09/2017 18:48   Ct Chest W Contrast  Result Date: 05/05/2017 CLINICAL DATA:  Small-cell lung cancer of the right upper lobe. Status post chemo radiation. EXAM: CT CHEST WITH CONTRAST TECHNIQUE: Multidetector CT imaging of the chest was performed during intravenous contrast administration. CONTRAST:  26mL ISOVUE-300 IOPAMIDOL (ISOVUE-300) INJECTION 61% COMPARISON:  01/31/2017 FINDINGS: Cardiovascular: Heart size normal. Stable appearance trace pericardial fluid or thickening. Coronary artery calcification is evident. Atherosclerotic calcification is noted in the wall of the thoracic aorta. Mediastinum/Nodes: No mediastinal lymphadenopathy. No left hilar lymphadenopathy. Soft tissue attenuation in the right hilum is slightly progressed and likely treatment related change. The esophagus has normal imaging features. There is no axillary  lymphadenopathy. Lungs/Pleura: Volume loss in the right hemithorax is compatible with the reported history of right middle lobectomy. Changes of centrilobular emphysema evident. Interval progression of interstitial and airspace opacity in the medial parahilar right lung likely related to evolving radiation fibrosis. Similar appearance biapical pleuroparenchymal scarring. No pleural effusion. Upper Abdomen: Small lymph nodes in the hepato duodenal ligament are stable. Musculoskeletal: Bone windows reveal no worrisome lytic or sclerotic osseous lesions. IMPRESSION: 1. Evolving post radiation change in the medial right lung. Continued close attention on follow-up recommended. 2. Otherwise stable exam. 3.  Aortic Atherosclerois (ICD10-170.0) 4.  Emphysema. ([id number]) Electronically Signed   By: Misty Stanley M.D.   On: 05/05/2017 08:46    ASSESSMENT AND PLAN: This is a very pleasant 72 years old white female with history of limited stage small cell lung cancer status post left upper lobectomy in June 2018.  The patient had evidence for disease recurrence with hypermetabolic subcarinal lymphadenopathy that was biopsy-proven to be small cell carcinoma. She completed systemic chemotherapy with cisplatin and etoposide.  She is status post 4 cycles.  This is concurrent with radiotherapy.   She also underwent prophylactic cranial irradiation. The patient continues to complain of fatigue and mild peripheral neuropathy. Her recent CT scan of the chest showed no concerning findings for disease recurrence. I recommended for the patient to continue on observation with repeat CT scan of the chest in 3 months. She was advised to call immediately if she has any concerning symptoms in the interval. The patient voices understanding of current disease status and treatment options and is in agreement with the current care plan.  All questions were answered. The patient knows to call the clinic with any problems, questions  or concerns. We can certainly see the patient much sooner if necessary.  Disclaimer: This note was dictated with voice recognition software. Similar sounding words can inadvertently be transcribed and may not be corrected upon review.

## 2017-05-08 ENCOUNTER — Telehealth: Payer: Self-pay | Admitting: Family Medicine

## 2017-05-08 NOTE — Telephone Encounter (Signed)
Called the patient and scheduled her an appt for Monday 05/12/2017 at [place]. She did agree she has continued fatigue/depression and no motivation.  She verbalized agreement to the appointment.

## 2017-05-08 NOTE — Telephone Encounter (Signed)
Please reach out and state the PT team is a little worried about her energy levels and mood. If she is also concerned, we can evaluate her in the clinic and discuss options. This is an offer based on their observations and she does not need to do anything if she is content. TY.

## 2017-05-08 NOTE — Telephone Encounter (Signed)
Do you want me to give her a call and see if she will come in for an appt?Reason for appt. To tell her?

## 2017-05-08 NOTE — Telephone Encounter (Signed)
Sounds like depression from her description. I am happy to see her to discuss options, but we can't force her to come in. TY.

## 2017-05-08 NOTE — Telephone Encounter (Signed)
Copied from [place] [id number]. Topic: Inquiry >> May 08, 2017  9:56 AM Scherrie Gerlach wrote: Flonnie Hailstone with [facility] is discharging pt today.   Remo Lipps would also like the dr to know a few things, and she has discussed these with the pt as well Pt is having a hard time with motivation.  Pt is sleeping long hours.  When pt gets up, pt often returns to bed for several hours, then the day is over.  So shopping is out of the question. Motivation is hindering her rehab.  Pt has been on Zoloft 100 mg several yrs. May need to be adjusted if appropriate.  Pt lives alone. Remo Lipps states even coming to the dr is overwhelming.  Pt cannot walk that far.  Still gets out of breath walking distances. Remo Lipps thinks PT is discharging her next week. Remo Lipps wants to make sure pt gets the help she needs since she is good enough doing things in the home and the insurance cannot justify continuing.

## 2017-05-12 ENCOUNTER — Encounter: Payer: Self-pay | Admitting: Family Medicine

## 2017-05-12 ENCOUNTER — Telehealth: Payer: Self-pay | Admitting: Family Medicine

## 2017-05-12 ENCOUNTER — Ambulatory Visit: Payer: Medicare Other | Admitting: Occupational Therapy

## 2017-05-12 ENCOUNTER — Ambulatory Visit: Payer: Medicare Other | Admitting: Family Medicine

## 2017-05-12 VITALS — BP 92/60 | HR 102 | Temp 97.8°F | Ht 65.0 in | Wt 127.0 lb

## 2017-05-12 DIAGNOSIS — R5383 Other fatigue: Secondary | ICD-10-CM | POA: Diagnosis not present

## 2017-05-12 DIAGNOSIS — J439 Emphysema, unspecified: Secondary | ICD-10-CM | POA: Diagnosis not present

## 2017-05-12 DIAGNOSIS — F339 Major depressive disorder, recurrent, unspecified: Secondary | ICD-10-CM | POA: Diagnosis not present

## 2017-05-12 MED ORDER — FLUTICASONE FUROATE-VILANTEROL 100-25 MCG/INH IN AEPB: 1.0000 | INHALATION_SPRAY | Freq: Every day | RESPIRATORY_TRACT | 3 refills | Status: DC

## 2017-05-12 NOTE — Patient Instructions (Addendum)
Ferrous sulfate is the over the counter iron supplement to take. Try to take it 3 times per day, but ok to decrease frequency if it upsets your stomach. It will turn your stools dark.   Remember to rinse your mouth out after using the inhaler.  Don't fill prescription if it is too expensive and let me know.   Try to eat. Protein shakes are a good option. Take a multivitamin if you are not eating normally.  If you need some more nausea medicine, let me know.   Let us know if you need anything.

## 2017-05-12 NOTE — Progress Notes (Signed)
Chief Complaint  Patient presents with  . Fatigue  . Breathing Problem  . Depression    Subjective Robyn Barton presents for fatigue and sob. Here w neighbor.  Over past several mo, has been having worsening SOB. Known hx of COPD and lung cancer s/p chemo and radiation. Sometimes will wheeze. Reports compliance with Spiriva, will end up using albuterol 3-4 times per day. She is unable to do adl's due to breathing. She remains off of cigarettes. Denies fevers or cough.   Pt has a hx of depression on Zoloft 100 mg/d. Reports compliance and no ae's. Does not follow w counselor or psychologist.  Feels fatigue, mood has been decreasing and lacks motivation.   Questionable hx of CHF? Echo WNL at end of 2018, pt reports no known hx. She is not on any medication for this issue.  ROS Psych: +depression Lungs: +sob  Past Medical History:  Diagnosis Date  . Allergic rhinitis   . Anxiety   . Elevated TSH    pt denies  . Emphysema lung ([place])   . Enlarged lymph nodes    in chest  . GERD (gastroesophageal reflux disease)   . Goals of care, counseling/discussion 10/21/2016  . History of hiatal hernia   . History of radiation therapy 03/10/17- 03/21/17   Whole brain, prophylactic, radiation 25 Gy in 10 fractions  . Hyperlipidemia   . IFG (impaired fasting glucose)   . Osteopenia   . Recurrent major depressive disorder ([place])   . Sleep apnea    mild no cpap needed  . Small cell carcinoma of right lung ([place]) 2018   surgery right middle lobe removed   . Tobacco dependence   . Varicose veins of both lower extremities   . Vitamin D deficiency    Family History  Problem Relation Age of Onset  . Depression Mother   . Cancer Father   . COPD Father   . Heart disease Father   . High blood pressure Father   . High Cholesterol Father    Allergies as of 05/12/2017   No Known Allergies     Medication List        Accurate as of 05/12/17 12:39 PM. Always use your most recent med list.          albuterol 108 (90 Base) MCG/ACT inhaler Commonly known as:  PROVENTIL HFA;VENTOLIN HFA Inhale 2 puffs into the lungs every 6 (six) hours as needed for wheezing or shortness of breath.   alendronate 70 MG tablet Commonly known as:  FOSAMAX Take 70 mg by mouth every Monday. Take with a full glass of water on an empty stomach.   atorvastatin 40 MG tablet Commonly known as:  LIPITOR Take 40 mg by mouth at bedtime.   calcium carbonate 500 MG chewable tablet Commonly known as:  TUMS - dosed in mg elemental calcium Chew 1 tablet daily as needed by mouth for indigestion or heartburn.   diphenhydramine-acetaminophen 25-500 MG Tabs tablet Commonly known as:  TYLENOL PM Take 1 tablet at bedtime as needed by mouth (sleep).   fluticasone furoate-vilanterol 100-25 MCG/INH Aepb Commonly known as:  BREO ELLIPTA Inhale 1 puff into the lungs daily.   loperamide 2 MG capsule Commonly known as:  IMODIUM Take 2 mg as needed by mouth for diarrhea or loose stools.   loratadine 10 MG tablet Commonly known as:  CLARITIN Take 10 mg by mouth daily as needed (hay fever).   omeprazole 40 MG capsule Commonly known as:  PRILOSEC Take 40 mg by mouth daily.   sertraline 100 MG tablet Commonly known as:  ZOLOFT Take 1 tablet (100 mg total) by mouth daily.   tiotropium 18 MCG inhalation capsule Commonly known as:  SPIRIVA Place 18 mcg into inhaler and inhale daily.   tiotropium 18 MCG inhalation capsule Commonly known as:  SPIRIVA HANDIHALER Place 1 capsule (18 mcg total) into inhaler and inhale daily.   Vitamin D 2000 units tablet Take 2,000 Units by mouth daily.       Exam BP 92/60 (BP Location: Left Arm, Patient Position: Sitting, Cuff Size: Normal)   Pulse (!) 102   Temp 97.8 F (36.6 C) (Oral)   Ht 5\' 5"  (1.651 m)   Wt 127 lb (57.6 kg)   SpO2 97%   BMI 21.13 kg/m  General:  well developed, well nourished, in no apparent distress HEENT: Ears neg, nares patent w/o d/c, mmm, no  erythema or exudate in pharynx Neck: neck supple without adenopathy, thyromegaly, or masses Lungs:  clear to auscultation, breath sounds equal bilaterally, no respiratory distress Cardio:  regular rate and rhythm without murmurs, heart sounds without clicks or rubs Psych: Age appropriate judgment and insight  Assessment and Plan  Pulmonary emphysema, unspecified emphysema type ([place]) - Plan: fluticasone furoate-vilanterol (BREO ELLIPTA) 100-25 MCG/INH AEPB  Fatigue, unspecified type  Depression, recurrent ([facility])  Cont Spiriva, add ICS/LABA. Rinse mouth out afterwards.  I think if we can get her breathing easier, she will be able to move around more. This can not only help her fatigue, but also her depression. Will hold off on adjusting Zoloft for now. With her hx of anemia, she was instructed to take PO Fe. I have written down exactly what I want her to take. Will try 3x/d, but fewer times if she does not tolerate.  F/u in 2 weeks to recheck breathing and fatigue- can explore theophylline vs adjusting her dosage. Will be interested to check Fe and CBC at that time. The patient voiced understanding and agreement to the plan.  [place], DO 05/12/17 12:39 PM

## 2017-05-12 NOTE — Telephone Encounter (Signed)
The patient stated she thought she was being sent in something for her sugar today. She thinks that is why she feels so bad. She states she does not hear well and was afraid she had missed some communication regarding this.

## 2017-05-12 NOTE — Telephone Encounter (Signed)
I don't think he sugars are high enough to warrant a medicine given her other issues at this time. TY.

## 2017-05-12 NOTE — Telephone Encounter (Signed)
Called informed the patient of PCP response to her question. She did verbalize understanding/did go over again with her the AVS for today's appointment explaining to her his instructions. She did understand and agree to do.

## 2017-05-12 NOTE — Progress Notes (Signed)
Pre visit review using our clinic review tool, if applicable. No additional management support is needed unless otherwise documented below in the visit note. 

## 2017-05-12 NOTE — Telephone Encounter (Signed)
Copied from [place] [id number]. Topic: Quick Communication - See Telephone Encounter >> May 12, 2017  3:30 PM Vernona Rieger wrote: CRM for notification. See Telephone encounter for: 05/12/17.  She said that she thought Dr Nani Ravens was going to prescribe her something for her blood sugars when she was in the office today.  She is requesting something to take for her sugar. She wants the nurse to call her. She would not give me any further information.  Call back is [phone]

## 2017-05-13 ENCOUNTER — Other Ambulatory Visit (HOSPITAL_BASED_OUTPATIENT_CLINIC_OR_DEPARTMENT_OTHER): Payer: Self-pay

## 2017-05-14 ENCOUNTER — Encounter

## 2017-05-14 ENCOUNTER — Telehealth: Payer: Self-pay

## 2017-05-14 ENCOUNTER — Ambulatory Visit: Payer: Self-pay | Admitting: Family Medicine

## 2017-05-14 NOTE — Telephone Encounter (Addendum)
Nutrition  Patient identified on Malnutrition Screening report for weight loss and poor appetite.  Last seen by RD on 12/31/16 during treatment.    Patient was being treated for lung cancer and receiving chemotherapy.  Currently under observation.  Called and spoke with patient briefly.  Patient reports that appetite has been poor due to side effects from chemotherapy.  Patient with shortness of breath on phone.  Noted medications have been adjusted to help with breathing.  Patient reports that she is packing and getting ready to go to sisters house in [place] because she can't take care of herself.  Has difficulty cooking, preparing meals.  Does drink ensure daily.  Has bouts of nausea.    Patient not able to continue conversation as needing to go to help pack.  Encouraged patient to purchase high calorie, high protein shakes and increase if able to 2 per day.  No further education done over the phone at this time. Contact information given and encouraged patient to reach out to RD with questions or concerns.  Robyn Barton B. Zenia Resides, [place], [place] Registered Dietitian [phone] (pager)

## 2017-05-16 ENCOUNTER — Telehealth: Payer: Self-pay | Admitting: *Deleted

## 2017-05-16 NOTE — Telephone Encounter (Signed)
Received Physician Orders from [facility]; forwarded to provider/SLS 05/03

## 2017-05-22 ENCOUNTER — Telehealth: Payer: Self-pay | Admitting: Family Medicine

## 2017-05-22 ENCOUNTER — Telehealth: Payer: Self-pay | Admitting: *Deleted

## 2017-05-22 NOTE — Telephone Encounter (Signed)
I would like to see records from urgent care office if they can have that sent here. TY.

## 2017-05-22 NOTE — Telephone Encounter (Signed)
Copied from [place] [id number]. Topic: Inquiry >> May 22, 2017 11:30 AM Margot Ables wrote: Pt states that she has been sick and in the office 4x and Dr. Nani Ravens has done nothing for her. She said her sister had to come get her and she went to an Urgent Care who told her she needs to see a nephrologist right away. She called and states that we need to send her records to the nephrologist she called. I advised we need a signed consent form to release her records. She said she wanted to talk to Shirlean Mylar for them to be sent right away. I was trying to confirm information with patient and she hung up. Rexanne Mano, MD [facility]  [address] [place], [place] [address] Phone: [phone] Fax: [phone]

## 2017-05-22 NOTE — Telephone Encounter (Signed)
Called left message to call back 

## 2017-05-22 NOTE — Telephone Encounter (Signed)
Did speak to the patient and her sister . They are requesting records to be sent to the nephrologist office in [facility] in order for her to get an appointment.  I did inform her to call that office and request a release of records form/complete it/and return to that office and they would send to our office to get her records.  The patient is currently in [place] With her sister and they do not plan on returning her to [place] until she has the appointment.

## 2017-05-22 NOTE — Telephone Encounter (Signed)
Received Physician Orders from [facility]; forwarded to provider/SLS 05/09

## 2017-05-23 NOTE — Telephone Encounter (Signed)
Received fax for medical records from [facility] Records were faxed to them yesterday 05/23/2017

## 2017-05-26 ENCOUNTER — Ambulatory Visit: Payer: Medicare Other | Admitting: Family Medicine

## 2017-05-27 ENCOUNTER — Ambulatory Visit: Payer: Medicare Other | Admitting: Family Medicine

## 2017-06-10 ENCOUNTER — Telehealth: Payer: Self-pay | Admitting: *Deleted

## 2017-06-10 NOTE — Telephone Encounter (Signed)
Received Physician Orders from [facility]; forwarded to provider/SLS 05/28

## 2017-06-19 ENCOUNTER — Other Ambulatory Visit: Payer: Self-pay

## 2017-06-24 ENCOUNTER — Inpatient Hospital Stay: Admission: RE | Admit: 2017-06-24 | Payer: Self-pay | Source: Ambulatory Visit | Admitting: Radiation Oncology

## 2017-07-08 ENCOUNTER — Other Ambulatory Visit: Payer: Self-pay

## 2017-07-09 ENCOUNTER — Telehealth: Payer: Self-pay | Admitting: Medical Oncology

## 2017-07-09 ENCOUNTER — Ambulatory Visit
Admission: RE | Admit: 2017-07-09 | Discharge: 2017-07-09 | Disposition: A | Discharge status: Home / Self Care | Payer: Medicare Other | Source: Ambulatory Visit | Attending: Radiation Oncology | Admitting: Radiation Oncology

## 2017-07-09 NOTE — Telephone Encounter (Signed)
"  Nausea , Vomiting ,head shakes , jerks all over a few times on  most days. My balance is not good- I stagger when I walk and I have to use a cane- for weeks and weeks".  Returned call. Pt wants to move up appt.. Pt resides at [facility] in [place]. Son lives in [place]. She is travelling back to  [place] on Saturday.  I encouraged pt to call son and go to ED .  Spoke to son ,Sherren Mocha,  and informed him of his mothers reported symptoms. I called nurse at [facility] and she will evaluate Vaughan Basta. Son notified that the nurse at [facility] will call him back.

## 2017-07-09 NOTE — Telephone Encounter (Signed)
Son called and reported the nurse told him pt was walking and said she was not vomiting.

## 2017-07-22 ENCOUNTER — Inpatient Hospital Stay: Admission: RE | Admit: 2017-07-22 | Payer: Self-pay | Source: Ambulatory Visit

## 2017-07-28 ENCOUNTER — Ambulatory Visit
Admission: RE | Admit: 2017-07-28 | Discharge: 2017-07-28 | Disposition: A | Discharge status: Home / Self Care | Payer: Medicare Other | Source: Ambulatory Visit | Attending: Radiation Oncology | Admitting: Radiation Oncology

## 2017-07-28 DIAGNOSIS — C7949 Secondary malignant neoplasm of other parts of nervous system: Principal | ICD-10-CM

## 2017-07-28 DIAGNOSIS — C7931 Secondary malignant neoplasm of brain: Secondary | ICD-10-CM

## 2017-07-28 MED ORDER — GADOBENATE DIMEGLUMINE 529 MG/ML IV SOLN
6.0000 mL | Freq: Once | INTRAVENOUS | Status: AC | PRN
  Administered 2017-07-28: 6 mL via INTRAVENOUS

## 2017-07-30 ENCOUNTER — Encounter: Payer: Self-pay | Admitting: Radiation Oncology

## 2017-07-30 NOTE — Progress Notes (Signed)
Ms. Vialpando presents for follow up of radiation treatment: 03/10/17- 03/21/17: Whole brain/ 25 Gy in 10 fractions. 11/18/16- 01/16/17: 59.4 Gy to the lung in 33 fractions.   She had a MRI brain 07/28/17. She is not feeling well. She reports constant nausea especially in the morning. She has dry heaves and gagging.  She is taking zofran with relief at times. She is not eating well due to the nausea and decreased appetite. She is weak and reports fatigue. She has shortness of breath with exertion and nausea. She reports hearing loss and tells me that it feels like she "has water in her ears".   BP 103/66 (BP Location: Right Arm, Patient Position: Sitting, Cuff Size: Normal)   Pulse (!) 108   Temp 98 F (36.7 C) (Oral)   Resp 20   Ht 5\' 5"  (1.651 m)   Wt 115 lb 8 oz (52.4 kg)   SpO2 98%   BMI 19.22 kg/m    Wt Readings from Last 3 Encounters:  08/01/17 115 lb 8 oz (52.4 kg)  05/12/17 127 lb (57.6 kg)  05/05/17 130 lb 11.2 oz (59.3 kg)

## 2017-08-01 ENCOUNTER — Encounter: Payer: Self-pay | Admitting: Radiation Oncology

## 2017-08-01 ENCOUNTER — Inpatient Hospital Stay: Payer: Medicare Other | Attending: Radiation Oncology

## 2017-08-01 ENCOUNTER — Ambulatory Visit (HOSPITAL_COMMUNITY)
Admission: RE | Admit: 2017-08-01 | Discharge: 2017-08-01 | Disposition: A | Discharge status: Home / Self Care | Payer: Medicare Other | Source: Ambulatory Visit | Attending: Internal Medicine | Admitting: Internal Medicine

## 2017-08-01 ENCOUNTER — Other Ambulatory Visit: Payer: Self-pay

## 2017-08-01 ENCOUNTER — Ambulatory Visit
Admission: RE | Admit: 2017-08-01 | Discharge: 2017-08-01 | Disposition: A | Discharge status: Home / Self Care | Payer: Medicare Other | Source: Ambulatory Visit | Attending: Radiation Oncology | Admitting: Radiation Oncology

## 2017-08-01 VITALS — BP 103/66 | HR 108 | Temp 98.0°F | Resp 20 | Ht 65.0 in | Wt 115.5 lb

## 2017-08-01 DIAGNOSIS — R110 Nausea: Secondary | ICD-10-CM | POA: Insufficient documentation

## 2017-08-01 DIAGNOSIS — Z79899 Other long term (current) drug therapy: Secondary | ICD-10-CM | POA: Diagnosis not present

## 2017-08-01 DIAGNOSIS — G319 Degenerative disease of nervous system, unspecified: Secondary | ICD-10-CM | POA: Diagnosis not present

## 2017-08-01 DIAGNOSIS — C3411 Malignant neoplasm of upper lobe, right bronchus or lung: Secondary | ICD-10-CM | POA: Insufficient documentation

## 2017-08-01 DIAGNOSIS — H9190 Unspecified hearing loss, unspecified ear: Secondary | ICD-10-CM | POA: Insufficient documentation

## 2017-08-01 DIAGNOSIS — Z923 Personal history of irradiation: Secondary | ICD-10-CM | POA: Diagnosis not present

## 2017-08-01 DIAGNOSIS — R251 Tremor, unspecified: Secondary | ICD-10-CM | POA: Diagnosis not present

## 2017-08-01 DIAGNOSIS — Z08 Encounter for follow-up examination after completed treatment for malignant neoplasm: Secondary | ICD-10-CM | POA: Diagnosis not present

## 2017-08-01 DIAGNOSIS — H9193 Unspecified hearing loss, bilateral: Secondary | ICD-10-CM | POA: Diagnosis not present

## 2017-08-01 DIAGNOSIS — R5383 Other fatigue: Secondary | ICD-10-CM | POA: Diagnosis not present

## 2017-08-01 DIAGNOSIS — Z9221 Personal history of antineoplastic chemotherapy: Secondary | ICD-10-CM | POA: Diagnosis not present

## 2017-08-01 DIAGNOSIS — M8580 Other specified disorders of bone density and structure, unspecified site: Secondary | ICD-10-CM | POA: Diagnosis not present

## 2017-08-01 DIAGNOSIS — Z902 Acquired absence of lung [part of]: Secondary | ICD-10-CM | POA: Diagnosis not present

## 2017-08-01 DIAGNOSIS — J439 Emphysema, unspecified: Secondary | ICD-10-CM | POA: Insufficient documentation

## 2017-08-01 DIAGNOSIS — R634 Abnormal weight loss: Secondary | ICD-10-CM | POA: Diagnosis not present

## 2017-08-01 DIAGNOSIS — I700 Atherosclerosis of aorta: Secondary | ICD-10-CM | POA: Diagnosis not present

## 2017-08-01 LAB — CBC WITH DIFFERENTIAL (CANCER CENTER ONLY)
BASOS PCT: 0 %
Basophils Absolute: 0 10*3/uL (ref 0.0–0.1)
EOS PCT: 0 %
Eosinophils Absolute: 0 10*3/uL (ref 0.0–0.5)
HCT: 36.5 % (ref 34.8–46.6)
Hemoglobin: 12.1 g/dL (ref 11.6–15.9)
LYMPHS PCT: 11 %
Lymphs Abs: 1 10*3/uL (ref 0.9–3.3)
MCH: 29.3 pg (ref 25.1–34.0)
MCHC: 33.2 g/dL (ref 31.5–36.0)
MCV: 88.4 fL (ref 79.5–101.0)
Monocytes Absolute: 0.7 10*3/uL (ref 0.1–0.9)
Monocytes Relative: 8 %
NEUTROS ABS: 7.4 10*3/uL — AB (ref 1.5–6.5)
NEUTROS PCT: 81 %
PLATELETS: 242 10*3/uL (ref 145–400)
RBC: 4.13 MIL/uL (ref 3.70–5.45)
RDW: 16.2 % — ABNORMAL HIGH (ref 11.2–14.5)
WBC Count: 9.1 10*3/uL (ref 3.9–10.3)

## 2017-08-01 LAB — CMP (CANCER CENTER ONLY)
ALBUMIN: 4.1 g/dL (ref 3.5–5.0)
ALK PHOS: 73 U/L (ref 38–126)
ALT: 11 U/L (ref 0–44)
ANION GAP: 10 (ref 5–15)
AST: 14 U/L — ABNORMAL LOW (ref 15–41)
BUN: 29 mg/dL — ABNORMAL HIGH (ref 8–23)
CALCIUM: 10.5 mg/dL — AB (ref 8.9–10.3)
CO2: 25 mmol/L (ref 22–32)
Chloride: 102 mmol/L (ref 98–111)
Creatinine: 1.44 mg/dL — ABNORMAL HIGH (ref 0.44–1.00)
GFR, EST AFRICAN AMERICAN: 40 mL/min — AB (ref >60)
GFR, Estimated: 35 mL/min — ABNORMAL LOW (ref >60)
GLUCOSE: 117 mg/dL — AB (ref 70–99)
POTASSIUM: 4.7 mmol/L (ref 3.5–5.1)
Sodium: 137 mmol/L (ref 135–145)
TOTAL PROTEIN: 8 g/dL (ref 6.5–8.1)
Total Bilirubin: 0.3 mg/dL (ref 0.3–1.2)

## 2017-08-01 MED ORDER — IOHEXOL 300 MG/ML  SOLN
75.0000 mL | Freq: Once | INTRAMUSCULAR | Status: AC | PRN
  Administered 2017-08-01: 50 mL via INTRAVENOUS

## 2017-08-01 NOTE — Progress Notes (Signed)
Radiation Oncology         [phone]) [phone] ________________________________  Name: Robyn Barton MRN: [id number]  Date: 08/01/2017  DOB: 09-Apr-1945  Follow-Up Visit Note  Outpatient  CC: Shelda Pal, DO  [name], MD  Diagnosis and Prior Radiotherapy:    ICD-10-CM   1. 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   Since our last visit the patient underwent MRI brain on 07/28/2017. These scans are negative for metastatic disease to the brain and show progressive atrophy and chronic white matter changes related to whole-brain radiation and chemotherapy. She also had a CT Chest earlier today -- results pending. I personally reviewed her imaging studies. No obvious masses seen in her liver.  Liver function tests look satisfactory today. Her CBC shows no anemia and her TSH was normal 3 months ago.   The patient is scheduled to follow up with Dr. [name] next week.  On review of systems, the patient states she is not feeling well. She is here alone today. Patient states she lives alone at home and cares for herself. She reports constant nausea especially in the morning. She reports dry heaves and gagging for the last two months. She is taking zofran with relief at times. She previously took compazine but ran out of it about two months ago. She is not eating well due to the nausea and decreased appetite. She has lost about 15 lbs in the last three months. She denies constipation and takes miralax nightly. She denies blood in stool. She states her last colonoscopy was 7 years ago and she was recommended to have repeat colonoscopy in 5 years however she  was so busy she was unable to have this procedure done. She reports weakness and fatigue. She also reports tremors. She has shortness of breath with exertion and nausea. She reports hearing loss more on the left than the right and tells nursing staff that it feels like she "has water in her both ears". She uses flonase and claritin for her allergies but has not tried any decongestants. Patient also notes she has not seen a gynecologist in 10-12 years and previously had a bladder sling.  ALLERGIES:  has No Known Allergies.  Meds: Current Outpatient Medications  Medication Sig Dispense Refill  . albuterol (PROVENTIL HFA;VENTOLIN HFA) 108 (90 Base) MCG/ACT inhaler Inhale 2 puffs into the lungs every 6 (six) hours as needed for wheezing or shortness of breath. 1 Inhaler 3  . alendronate (FOSAMAX) 70 MG tablet Take 70 mg by mouth every Monday. Take with a full glass of water on an empty stomach.     Marland Kitchen atorvastatin (LIPITOR) 40 MG tablet Take 40 mg by mouth at bedtime.     . calcium carbonate (TUMS - DOSED IN MG ELEMENTAL CALCIUM) 500 MG chewable tablet Chew 1 tablet daily as needed by mouth for indigestion or heartburn.    . Cholecalciferol (VITAMIN D) 2000 units tablet Take 2,000 Units by mouth daily.    . diphenhydramine-acetaminophen (TYLENOL PM) 25-500 MG TABS tablet Take 1 tablet at bedtime as needed by mouth (sleep).    . fluticasone furoate-vilanterol (BREO ELLIPTA) 100-25 MCG/INH AEPB Inhale 1 puff into the  lungs daily. 1 each 3  . loratadine (CLARITIN) 10 MG tablet Take 10 mg by mouth daily as needed (hay fever).    Marland Kitchen omeprazole (PRILOSEC) 40 MG capsule Take 40 mg by mouth daily.   3  . polyethylene glycol (MIRALAX / GLYCOLAX) packet Take 17 g by mouth daily.    . sertraline (ZOLOFT) 100 MG tablet Take 1 tablet (100 mg total) by mouth daily. 90 tablet 3  . tiotropium (SPIRIVA HANDIHALER) 18 MCG inhalation capsule Place 1 capsule (18 mcg total) into inhaler and inhale daily. 30 capsule 3  .  tiotropium (SPIRIVA) 18 MCG inhalation capsule Place 18 mcg into inhaler and inhale daily.    Marland Kitchen loperamide (IMODIUM) 2 MG capsule Take 2 mg as needed by mouth for diarrhea or loose stools.    . ondansetron (ZOFRAN) 8 MG tablet TAKE 1 TABLET (8 MG TOTAL) BY MOUTH EVERY 8 (EIGHT) HOURS AS NEEDED FOR UP TO 7 DAYS FOR NAUSEA.  2   No current facility-administered medications for this encounter.     Physical Findings:  height is 5\' 5"  (1.651 m) and weight is 115 lb 8 oz (52.4 kg). Her oral temperature is 98 F (36.7 C). Her blood pressure is 103/66 and her pulse is 108 (abnormal). Her respiration is 20 and oxygen saturation is 98%. .    General: Alert and oriented, in no acute distress. In wheelchair. HEENT: Head is normocephalic. Extraocular movements are intact. Oropharynx is clear. No obvious abnormality of tympanic membranes bilaterally. Neck: Neck is supple, no palpable cervical or supraclavicular lymphadenopathy. Heart: Regular in rate and rhythm with no murmurs, rubs, or gallops. Chest: Scattered wheezes   Abdomen: Tenderness with palpation of RUQ. Extremities: No cyanosis or edema. Lymphatics: see Neck Exam Skin: No concerning lesions. Musculoskeletal:  In a WC today. Neurologic:  Speech is fluent. Involuntary movements of the head and upper torso noted Psychiatric: Judgment and insight are intact. Affect is appropriate.  Lab Findings: Lab Results  Component Value Date   WBC 9.1 08/01/2017   HGB 12.1 08/01/2017   HCT 36.5 08/01/2017   MCV 88.4 08/01/2017   PLT 242 08/01/2017    Radiographic Findings: Ct Chest W Contrast  Result Date: 08/01/2017 CLINICAL DATA:  Follow-up right lung cancer, status post right lung resection, chemotherapy and XRT complete. Shortness of breath. EXAM: CT CHEST WITH CONTRAST TECHNIQUE: Multidetector CT imaging of the chest was performed during intravenous contrast administration. CONTRAST:  68mL OMNIPAQUE IOHEXOL 300 MG/ML  SOLN COMPARISON:   05/02/2017 FINDINGS: Cardiovascular: Heart is normal in size.  No pericardial effusion. No evidence of thoracic aortic aneurysm. Atherosclerotic calcifications of the aortic arch. Coronary atherosclerosis of the LAD. Mediastinum/Nodes: No suspicious mediastinal lymphadenopathy. Visualized thyroid is unremarkable. Lungs/Pleura: Status post right middle lobectomy. Radiation changes in the superior segment right lower lobe and right paramediastinal region. Biapical pleural-parenchymal scarring. Mild paraseptal emphysematous changes, upper lung predominant. No suspicious pulmonary nodules. No focal consolidation. Trace right pleural fluid.  No pneumothorax. Upper Abdomen: Visualized upper abdomen is grossly unremarkable. Musculoskeletal: Degenerative changes of the visualized thoracolumbar spine. IMPRESSION: Status post right middle lobectomy with radiation changes in the right hemithorax, as above. No evidence of recurrent metastatic disease. Aortic Atherosclerosis (ICD10-I70.0) and Emphysema (ICD10-J43.9). Electronically Signed   By: Julian Hy M.D.   On: 08/01/2017 15:39   Mr Jeri Cos ER Contrast  Result Date: 07/28/2017 CLINICAL DATA:  History of small cell lung cancer with metastatic disease. Whole-brain radiation 03/21/2017. Creatinine was obtained on site at  [facility] at [place] [address] Results: Creatinine 1.4 mg/dL. EXAM: MRI HEAD WITHOUT AND WITH CONTRAST TECHNIQUE: Multiplanar, multiecho pulse sequences of the brain and surrounding structures were obtained without and with intravenous contrast. CONTRAST:  34mL MULTIHANCE GADOBENATE DIMEGLUMINE 529 MG/ML IV SOLN. Reduced dose contrast due to renal insufficiency. COMPARISON:  MRI head 02/17/2017 FINDINGS: Brain: Image quality degraded by mild motion. Progressive atrophy. Progressive diffuse white matter hyperintensity. Mild hyperintensity in the pons is stable. Findings most compatible with whole-brain radiation and chemotherapy. No  enhancing metastatic deposits identified. No vasogenic edema. No acute infarct. No midline shift. Vascular: Normal arterial flow voids Skull and upper cervical spine: Negative Sinuses/Orbits: Paranasal sinuses clear.  Bilateral cataract surgery Other: None IMPRESSION: Negative for metastatic disease to the brain Progressive atrophy and chronic white matter changes related to whole-brain radiation and chemotherapy. Electronically Signed   By: Franchot Gallo M.D.   On: 07/28/2017 11:36    Impression/Plan:  Recovering from PCI to the brain, also prior RT to lungs by Dr Tammi Klippel.  I will refer the patient to an ENT for evaluation of hearing loss. I will also refer the patient to neuro-oncologist Dr. Mickeal Skinner for current neurologic symptoms. (After consultation, patient was presented at [facility] tumor board.  Effusions noted inside inner ears by radiology - defer to ENT on management.  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  The patient is scheduled to follow up with Dr. [name] next week and she will discuss current nausea with him. Until then she will continue Zofran. We will also see if our social worker can meet with the patient when she returns next week as patient needs more help at home with limited support.  Tentatively we will f/u with next brain MRI in 47mo. _____________________________________   Eppie Gibson, MD   This document serves as a record of services personally performed by Eppie Gibson, MD. It was created on her behalf by Arlyce Harman, a trained medical scribe. The creation of this record is based on the scribe's personal observations and the provider's statements to them. This document has been checked and approved by the attending provider.

## 2017-08-04 ENCOUNTER — Telehealth: Payer: Self-pay | Admitting: Internal Medicine

## 2017-08-04 ENCOUNTER — Encounter: Payer: Self-pay | Admitting: Radiation Oncology

## 2017-08-04 ENCOUNTER — Inpatient Hospital Stay (HOSPITAL_BASED_OUTPATIENT_CLINIC_OR_DEPARTMENT_OTHER): Payer: Medicare Other | Admitting: Internal Medicine

## 2017-08-04 ENCOUNTER — Encounter: Payer: Self-pay | Admitting: *Deleted

## 2017-08-04 ENCOUNTER — Encounter: Payer: Self-pay | Admitting: Internal Medicine

## 2017-08-04 ENCOUNTER — Other Ambulatory Visit: Payer: Self-pay | Admitting: Radiation Oncology

## 2017-08-04 VITALS — BP 121/68 | HR 99 | Temp 98.3°F | Resp 18 | Ht 65.0 in | Wt 116.6 lb

## 2017-08-04 DIAGNOSIS — J449 Chronic obstructive pulmonary disease, unspecified: Secondary | ICD-10-CM

## 2017-08-04 DIAGNOSIS — C3411 Malignant neoplasm of upper lobe, right bronchus or lung: Secondary | ICD-10-CM

## 2017-08-04 DIAGNOSIS — M8580 Other specified disorders of bone density and structure, unspecified site: Secondary | ICD-10-CM

## 2017-08-04 DIAGNOSIS — R110 Nausea: Secondary | ICD-10-CM

## 2017-08-04 DIAGNOSIS — Z9221 Personal history of antineoplastic chemotherapy: Secondary | ICD-10-CM

## 2017-08-04 DIAGNOSIS — H938X9 Other specified disorders of ear, unspecified ear: Secondary | ICD-10-CM

## 2017-08-04 DIAGNOSIS — R5383 Other fatigue: Secondary | ICD-10-CM

## 2017-08-04 DIAGNOSIS — Z923 Personal history of irradiation: Secondary | ICD-10-CM

## 2017-08-04 DIAGNOSIS — G252 Other specified forms of tremor: Secondary | ICD-10-CM

## 2017-08-04 DIAGNOSIS — R634 Abnormal weight loss: Secondary | ICD-10-CM | POA: Diagnosis not present

## 2017-08-04 DIAGNOSIS — Z79899 Other long term (current) drug therapy: Secondary | ICD-10-CM

## 2017-08-04 DIAGNOSIS — H9190 Unspecified hearing loss, unspecified ear: Secondary | ICD-10-CM

## 2017-08-04 NOTE — Progress Notes (Signed)
[place] [phone]   [phone]  OFFICE PROGRESS NOTE  Shelda Pal, DO [address] [place] [address]  DIAGNOSIS: Recurrent small cell lung cancer initially diagnosed as Limited stage (T1a, N0, M0) small cell lung cancer diagnosed in June 2018.  PRIOR THERAPY:  1) right upper lobectomy on 06/26/2016 and the final pathology was consistent with small cell lung cancer measuring 1.6 cm in size. 2) systemic chemotherapy with cisplatin 60 MG/M2 on day 1 and etoposide 120 MG/M2 on days 1, 2 and 3 every 3 weeks.  Status post 4 cycles.  This was concurrent with radiation. 3) prophylactic cranial irradiation under the care of Dr. Isidore Moos.  CURRENT THERAPY: Observation.  INTERVAL HISTORY: Robyn Barton 72 y.o. female returns to the clinic today for follow-up visit.  The patient is feeling fine today except for nausea early in the morning as well as fatigue.  She also lost few pounds since her last visit.  She has some hearing deficit after the radiotherapy and she was referred to ENT for evaluation.  She denied having any current chest pain, shortness of breath, cough or hemoptysis.  She denied having any fever or chills.  She has no headache or visual changes.  The patient had repeat CT scan of the chest as well as MRI of the brain performed recently and she is here for evaluation and discussion of her scan results.  MEDICAL HISTORY: Past Medical History:  Diagnosis Date  . Allergic rhinitis   . Anxiety   . Elevated TSH    pt denies  . Emphysema lung ([place])   . Enlarged lymph nodes    in chest  . GERD (gastroesophageal reflux disease)   . Goals of care, counseling/discussion 10/21/2016  . History of hiatal hernia   . History of radiation therapy 03/10/17- 03/21/17   Whole brain, prophylactic, radiation 25 Gy in 10 fractions  . History of radiation therapy 11/18/16- 01/16/17   59.4 Gy to lung in 33 fractions.  .  Hyperlipidemia   . IFG (impaired fasting glucose)   . Osteopenia   . Recurrent major depressive disorder ([place])   . Sleep apnea    mild no cpap needed  . Small cell carcinoma of right lung ([place]) 2018   surgery right middle lobe removed   . Tobacco dependence   . Varicose veins of both lower extremities   . Vitamin D deficiency     ALLERGIES:  has No Known Allergies.  MEDICATIONS:  Current Outpatient Medications  Medication Sig Dispense Refill  . albuterol (PROVENTIL HFA;VENTOLIN HFA) 108 (90 Base) MCG/ACT inhaler Inhale 2 puffs into the lungs every 6 (six) hours as needed for wheezing or shortness of breath. 1 Inhaler 3  . alendronate (FOSAMAX) 70 MG tablet Take 70 mg by mouth every Monday. Take with a full glass of water on an empty stomach.     Marland Kitchen atorvastatin (LIPITOR) 40 MG tablet Take 40 mg by mouth at bedtime.     . calcium carbonate (TUMS - DOSED IN MG ELEMENTAL CALCIUM) 500 MG chewable tablet Chew 1 tablet daily as needed by mouth for indigestion or heartburn.    . Cholecalciferol (VITAMIN D) 2000 units tablet Take 2,000 Units by mouth daily.    . diphenhydramine-acetaminophen (TYLENOL PM) 25-500 MG TABS tablet Take 1 tablet at bedtime as needed by mouth (sleep).    . fluticasone furoate-vilanterol (BREO ELLIPTA) 100-25 MCG/INH AEPB Inhale 1  puff into the lungs daily. 1 each 3  . loperamide (IMODIUM) 2 MG capsule Take 2 mg as needed by mouth for diarrhea or loose stools.    Marland Kitchen loratadine (CLARITIN) 10 MG tablet Take 10 mg by mouth daily as needed (hay fever).    Marland Kitchen omeprazole (PRILOSEC) 40 MG capsule Take 40 mg by mouth daily.   3  . ondansetron (ZOFRAN) 8 MG tablet TAKE 1 TABLET (8 MG TOTAL) BY MOUTH EVERY 8 (EIGHT) HOURS AS NEEDED FOR UP TO 7 DAYS FOR NAUSEA.  2  . polyethylene glycol (MIRALAX / GLYCOLAX) packet Take 17 g by mouth daily.    . sertraline (ZOLOFT) 100 MG tablet Take 1 tablet (100 mg total) by mouth daily. 90 tablet 3  . tiotropium (SPIRIVA HANDIHALER) 18 MCG  inhalation capsule Place 1 capsule (18 mcg total) into inhaler and inhale daily. 30 capsule 3  . tiotropium (SPIRIVA) 18 MCG inhalation capsule Place 18 mcg into inhaler and inhale daily.     No current facility-administered medications for this visit.     SURGICAL HISTORY:  Past Surgical History:  Procedure Laterality Date  . ABDOMINAL HYSTERECTOMY     1 ovary left  . ENDOBRONCHIAL ULTRASOUND Bilateral 10/15/2016   Procedure: ENDOBRONCHIAL ULTRASOUND;  Surgeon: Collene Gobble, MD;  Location: [facility];  Service: Cardiopulmonary;  Laterality: Bilateral;  . INCONTINENCE SURGERY     bladder sling  . right middle lobe lung surgery  06/26/2016   [facility]    REVIEW OF SYSTEMS:  A comprehensive review of systems was negative except for: Constitutional: positive for fatigue and weight loss Ears, nose, mouth, throat, and face: positive for hearing loss Gastrointestinal: positive for nausea   PHYSICAL EXAMINATION: General appearance: alert, cooperative, fatigued and no distress Head: Normocephalic, without obvious abnormality, atraumatic Neck: no adenopathy, no JVD, supple, symmetrical, trachea midline and thyroid not enlarged, symmetric, no tenderness/mass/nodules Lymph nodes: Cervical, supraclavicular, and axillary nodes normal. Resp: clear to auscultation bilaterally Back: symmetric, no curvature. ROM normal. No CVA tenderness. Cardio: regular rate and rhythm, S1, S2 normal, no murmur, click, rub or gallop GI: soft, non-tender; bowel sounds normal; no masses,  no organomegaly Extremities: extremities normal, atraumatic, no cyanosis or edema  ECOG PERFORMANCE STATUS: 1 - Symptomatic but completely ambulatory  Blood pressure 121/68, pulse 99, temperature 98.3 F (36.8 C), temperature source Oral, resp. rate 18, height 5\' 5"  (1.651 m), weight 116 lb 9.6 oz (52.9 kg), SpO2 100 %.  LABORATORY DATA: Lab Results  Component Value Date   WBC 9.1 08/01/2017   HGB 12.1 08/01/2017   HCT  36.5 08/01/2017   MCV 88.4 08/01/2017   PLT 242 08/01/2017      Chemistry      Component Value Date/Time   NA 137 08/01/2017 1057   NA 135 (L) 01/10/2017 1045   K 4.7 08/01/2017 1057   K 4.9 01/10/2017 1045   CL 102 08/01/2017 1057   CO2 25 08/01/2017 1057   CO2 20 (L) 01/10/2017 1045   BUN 29 (H) 08/01/2017 1057   BUN 27.6 (H) 01/10/2017 1045   CREATININE 1.44 (H) 08/01/2017 1057   CREATININE 1.4 (H) 01/10/2017 1045      Component Value Date/Time   CALCIUM 10.5 (H) 08/01/2017 1057   CALCIUM 9.7 01/10/2017 1045   ALKPHOS 73 08/01/2017 1057   ALKPHOS 77 01/10/2017 1045   AST 14 (L) 08/01/2017 1057   AST 10 01/10/2017 1045   ALT 11 08/01/2017 1057   ALT 11 01/10/2017  1045   BILITOT 0.3 08/01/2017 1057   BILITOT 0.34 01/10/2017 1045       RADIOGRAPHIC STUDIES: Ct Chest W Contrast  Result Date: 08/01/2017 CLINICAL DATA:  Follow-up right lung cancer, status post right lung resection, chemotherapy and XRT complete. Shortness of breath. EXAM: CT CHEST WITH CONTRAST TECHNIQUE: Multidetector CT imaging of the chest was performed during intravenous contrast administration. CONTRAST:  41mL OMNIPAQUE IOHEXOL 300 MG/ML  SOLN COMPARISON:  05/02/2017 FINDINGS: Cardiovascular: Heart is normal in size.  No pericardial effusion. No evidence of thoracic aortic aneurysm. Atherosclerotic calcifications of the aortic arch. Coronary atherosclerosis of the LAD. Mediastinum/Nodes: No suspicious mediastinal lymphadenopathy. Visualized thyroid is unremarkable. Lungs/Pleura: Status post right middle lobectomy. Radiation changes in the superior segment right lower lobe and right paramediastinal region. Biapical pleural-parenchymal scarring. Mild paraseptal emphysematous changes, upper lung predominant. No suspicious pulmonary nodules. No focal consolidation. Trace right pleural fluid.  No pneumothorax. Upper Abdomen: Visualized upper abdomen is grossly unremarkable. Musculoskeletal: Degenerative changes of  the visualized thoracolumbar spine. IMPRESSION: Status post right middle lobectomy with radiation changes in the right hemithorax, as above. No evidence of recurrent metastatic disease. Aortic Atherosclerosis (ICD10-I70.0) and Emphysema (ICD10-J43.9). Electronically Signed   By: Julian Hy M.D.   On: 08/01/2017 15:39   Mr Jeri Cos RS Contrast  Result Date: 07/28/2017 CLINICAL DATA:  History of small cell lung cancer with metastatic disease. Whole-brain radiation 03/21/2017. Creatinine was obtained on site at [facility] at [address] Results: Creatinine 1.4 mg/dL. EXAM: MRI HEAD WITHOUT AND WITH CONTRAST TECHNIQUE: Multiplanar, multiecho pulse sequences of the brain and surrounding structures were obtained without and with intravenous contrast. CONTRAST:  61mL MULTIHANCE GADOBENATE DIMEGLUMINE 529 MG/ML IV SOLN. Reduced dose contrast due to renal insufficiency. COMPARISON:  MRI head 02/17/2017 FINDINGS: Brain: Image quality degraded by mild motion. Progressive atrophy. Progressive diffuse white matter hyperintensity. Mild hyperintensity in the pons is stable. Findings most compatible with whole-brain radiation and chemotherapy. No enhancing metastatic deposits identified. No vasogenic edema. No acute infarct. No midline shift. Vascular: Normal arterial flow voids Skull and upper cervical spine: Negative Sinuses/Orbits: Paranasal sinuses clear.  Bilateral cataract surgery Other: None IMPRESSION: Negative for metastatic disease to the brain Progressive atrophy and chronic white matter changes related to whole-brain radiation and chemotherapy. Electronically Signed   By: Franchot Gallo M.D.   On: 07/28/2017 11:36    ASSESSMENT AND PLAN: This is a very pleasant 72 years old white female with history of limited stage small cell lung cancer status post left upper lobectomy in June 2018.  The patient had evidence for disease recurrence with hypermetabolic subcarinal lymphadenopathy that was  biopsy-proven to be small cell carcinoma. She completed systemic chemotherapy with cisplatin and etoposide.  She is status post 4 cycles.  This is concurrent with radiotherapy.   She also underwent prophylactic cranial irradiation completed in early March 2019. The patient has been in observation since that time.  She is feeling fine except for early morning nausea in addition to fatigue and weight loss. The patient had a repeat MRI of the brain as well as CT scan of the chest performed recently.  I personally reviewed the scans and discussed the results with the patient today.  Her scan showed no concerning findings for disease recurrence or progression. I recommended for her to continue on observation with repeat CT scan of the chest in 3 months. For the nausea, she was advised to take her antiemetic and also increase her liquid intake. The  patient was advised to call immediately if she has any concerning symptoms in the interval. The patient voices understanding of current disease status and treatment options and is in agreement with the current care plan.  All questions were answered. The patient knows to call the clinic with any problems, questions or concerns. We can certainly see the patient much sooner if necessary.  Disclaimer: This note was dictated with voice recognition software. Similar sounding words can inadvertently be transcribed and may not be corrected upon review.

## 2017-08-04 NOTE — Telephone Encounter (Signed)
Scheduled appt per 7/22 los - gave avs and calender per los.

## 2017-08-04 NOTE — Progress Notes (Signed)
Oncology Nurse Navigator Documentation  Oncology Nurse Navigator Flowsheets 08/04/2017  Navigator Location [facility]  Navigator Encounter Type Clinic/MDC/I spoke with patient today at clinic.  She has recent weight loss and I spoke to her about speaking with the dietitian.  She does not want to do that at this time.  She also states she gets tired and hard to do house work.  I asked about her family who lives out of town.  I listened as she explained.  CSW will speak to her today to help triage needs.   Patient Visit Type MedOnc  Treatment Phase Treatment  Barriers/Navigation Needs Education  Education Other  Interventions Education  Education Method Verbal  Acuity Level 1  Time Spent with Patient 15

## 2017-08-05 ENCOUNTER — Telehealth: Payer: Self-pay | Admitting: *Deleted

## 2017-08-05 NOTE — Telephone Encounter (Signed)
CALLED PATIENT TO INFORM OF APPT. WITH DR. Constance Holster ON AUG. 12-ARRIVAL TIME- 9:30 AM, SPOKE WITH PATIENT AND SHE IS AWARE OF THIS APPT.

## 2017-08-08 NOTE — Progress Notes (Unsigned)
[place] Work  Clinical Social Work was referred by P[employer] scheme manager for assessment of psychosocial needs.  Clinical Social Worker met with patient after medical oncology appointment  to offer support and assess for needs.  Robyn Barton lives alone, her main support is her son whom lives in [place] and local friends/neighbors.  CSW reviewed practical resources available to patient, all out of pocket expenses- patient not interested at this time. CSW encouraged patient to share how she was feeling, emotionally.  Patient expressed having "bouts of depression", however feeling "good" today.  Robyn Barton discussed feeling isolated and needing a reason to get out of the house.  CSW encouraged patient to utilize free programs through [facility] such as cooking class, gardening class, Look Good Feel Better, or art programs.  CSW provided patient with information and encouraged her to follow up with CSW as needed.   Gwinda Maine, LCSW  Clinical Social Worker [name]

## 2017-08-11 ENCOUNTER — Encounter (HOSPITAL_COMMUNITY): Payer: Self-pay

## 2017-08-11 ENCOUNTER — Emergency Department (HOSPITAL_COMMUNITY): Payer: Medicare Other

## 2017-08-11 ENCOUNTER — Other Ambulatory Visit: Payer: Self-pay

## 2017-08-11 ENCOUNTER — Inpatient Hospital Stay (HOSPITAL_COMMUNITY)
Admission: EM | Admit: 2017-08-11 | Discharge: 2017-08-15 | DRG: 470 | Disposition: A | Discharge status: Skilled Nursing Facility | Payer: Medicare Other | Attending: Family Medicine | Admitting: Family Medicine

## 2017-08-11 DIAGNOSIS — Z419 Encounter for procedure for purposes other than remedying health state, unspecified: Secondary | ICD-10-CM

## 2017-08-11 DIAGNOSIS — S72009A Fracture of unspecified part of neck of unspecified femur, initial encounter for closed fracture: Secondary | ICD-10-CM | POA: Diagnosis present

## 2017-08-11 DIAGNOSIS — Z90710 Acquired absence of both cervix and uterus: Secondary | ICD-10-CM

## 2017-08-11 DIAGNOSIS — Z923 Personal history of irradiation: Secondary | ICD-10-CM | POA: Diagnosis not present

## 2017-08-11 DIAGNOSIS — K311 Adult hypertrophic pyloric stenosis: Secondary | ICD-10-CM | POA: Diagnosis present

## 2017-08-11 DIAGNOSIS — I8393 Asymptomatic varicose veins of bilateral lower extremities: Secondary | ICD-10-CM | POA: Diagnosis present

## 2017-08-11 DIAGNOSIS — S72002A Fracture of unspecified part of neck of left femur, initial encounter for closed fracture: Secondary | ICD-10-CM | POA: Diagnosis not present

## 2017-08-11 DIAGNOSIS — N183 Chronic kidney disease, stage 3 unspecified: Secondary | ICD-10-CM | POA: Diagnosis present

## 2017-08-11 DIAGNOSIS — E785 Hyperlipidemia, unspecified: Secondary | ICD-10-CM | POA: Diagnosis present

## 2017-08-11 DIAGNOSIS — S72042A Displaced fracture of base of neck of left femur, initial encounter for closed fracture: Secondary | ICD-10-CM | POA: Diagnosis not present

## 2017-08-11 DIAGNOSIS — Y92000 Kitchen of unspecified non-institutional (private) residence as  the place of occurrence of the external cause: Secondary | ICD-10-CM

## 2017-08-11 DIAGNOSIS — J449 Chronic obstructive pulmonary disease, unspecified: Secondary | ICD-10-CM | POA: Diagnosis not present

## 2017-08-11 DIAGNOSIS — D62 Acute posthemorrhagic anemia: Secondary | ICD-10-CM | POA: Diagnosis not present

## 2017-08-11 DIAGNOSIS — Z825 Family history of asthma and other chronic lower respiratory diseases: Secondary | ICD-10-CM

## 2017-08-11 DIAGNOSIS — Z902 Acquired absence of lung [part of]: Secondary | ICD-10-CM | POA: Diagnosis not present

## 2017-08-11 DIAGNOSIS — F339 Major depressive disorder, recurrent, unspecified: Secondary | ICD-10-CM | POA: Diagnosis present

## 2017-08-11 DIAGNOSIS — K219 Gastro-esophageal reflux disease without esophagitis: Secondary | ICD-10-CM | POA: Diagnosis present

## 2017-08-11 DIAGNOSIS — S72012A Unspecified intracapsular fracture of left femur, initial encounter for closed fracture: Secondary | ICD-10-CM | POA: Diagnosis present

## 2017-08-11 DIAGNOSIS — Z96649 Presence of unspecified artificial hip joint: Secondary | ICD-10-CM

## 2017-08-11 DIAGNOSIS — Z9221 Personal history of antineoplastic chemotherapy: Secondary | ICD-10-CM

## 2017-08-11 DIAGNOSIS — Z85118 Personal history of other malignant neoplasm of bronchus and lung: Secondary | ICD-10-CM

## 2017-08-11 DIAGNOSIS — Z818 Family history of other mental and behavioral disorders: Secondary | ICD-10-CM | POA: Diagnosis not present

## 2017-08-11 DIAGNOSIS — R339 Retention of urine, unspecified: Secondary | ICD-10-CM | POA: Diagnosis present

## 2017-08-11 DIAGNOSIS — F419 Anxiety disorder, unspecified: Secondary | ICD-10-CM | POA: Diagnosis present

## 2017-08-11 DIAGNOSIS — G4730 Sleep apnea, unspecified: Secondary | ICD-10-CM | POA: Diagnosis present

## 2017-08-11 DIAGNOSIS — E559 Vitamin D deficiency, unspecified: Secondary | ICD-10-CM | POA: Diagnosis present

## 2017-08-11 DIAGNOSIS — Z8349 Family history of other endocrine, nutritional and metabolic diseases: Secondary | ICD-10-CM

## 2017-08-11 DIAGNOSIS — M8580 Other specified disorders of bone density and structure, unspecified site: Secondary | ICD-10-CM | POA: Diagnosis present

## 2017-08-11 DIAGNOSIS — C3411 Malignant neoplasm of upper lobe, right bronchus or lung: Secondary | ICD-10-CM | POA: Diagnosis not present

## 2017-08-11 DIAGNOSIS — D72829 Elevated white blood cell count, unspecified: Secondary | ICD-10-CM | POA: Diagnosis present

## 2017-08-11 DIAGNOSIS — D6481 Anemia due to antineoplastic chemotherapy: Secondary | ICD-10-CM | POA: Diagnosis present

## 2017-08-11 DIAGNOSIS — W19XXXA Unspecified fall, initial encounter: Secondary | ICD-10-CM | POA: Diagnosis present

## 2017-08-11 DIAGNOSIS — S72002P Fracture of unspecified part of neck of left femur, subsequent encounter for closed fracture with malunion: Secondary | ICD-10-CM | POA: Diagnosis not present

## 2017-08-11 DIAGNOSIS — J439 Emphysema, unspecified: Secondary | ICD-10-CM | POA: Diagnosis present

## 2017-08-11 DIAGNOSIS — T451X5A Adverse effect of antineoplastic and immunosuppressive drugs, initial encounter: Secondary | ICD-10-CM | POA: Diagnosis present

## 2017-08-11 DIAGNOSIS — Z7983 Long term (current) use of bisphosphonates: Secondary | ICD-10-CM

## 2017-08-11 DIAGNOSIS — Z8249 Family history of ischemic heart disease and other diseases of the circulatory system: Secondary | ICD-10-CM

## 2017-08-11 DIAGNOSIS — Z79899 Other long term (current) drug therapy: Secondary | ICD-10-CM

## 2017-08-11 DIAGNOSIS — J441 Chronic obstructive pulmonary disease with (acute) exacerbation: Secondary | ICD-10-CM | POA: Diagnosis present

## 2017-08-11 DIAGNOSIS — Z87891 Personal history of nicotine dependence: Secondary | ICD-10-CM | POA: Diagnosis not present

## 2017-08-11 DIAGNOSIS — N1831 Chronic kidney disease, stage 3a: Secondary | ICD-10-CM | POA: Diagnosis present

## 2017-08-11 LAB — CBC WITH DIFFERENTIAL/PLATELET
BASOS PCT: 0 %
Basophils Absolute: 0 10*3/uL (ref 0.0–0.1)
EOS ABS: 0.1 10*3/uL (ref 0.0–0.7)
Eosinophils Relative: 1 %
HEMATOCRIT: 35.1 % — AB (ref 36.0–46.0)
HEMOGLOBIN: 11.6 g/dL — AB (ref 12.0–15.0)
Lymphocytes Relative: 7 %
Lymphs Abs: 0.7 10*3/uL (ref 0.7–4.0)
MCH: 29.2 pg (ref 26.0–34.0)
MCHC: 33 g/dL (ref 30.0–36.0)
MCV: 88.4 fL (ref 78.0–100.0)
Monocytes Absolute: 0.6 10*3/uL (ref 0.1–1.0)
Monocytes Relative: 5 %
NEUTROS ABS: 9.5 10*3/uL — AB (ref 1.7–7.7)
NEUTROS PCT: 87 %
Platelets: 227 10*3/uL (ref 150–400)
RBC: 3.97 MIL/uL (ref 3.87–5.11)
RDW: 15.9 % — ABNORMAL HIGH (ref 11.5–15.5)
WBC: 10.9 10*3/uL — AB (ref 4.0–10.5)

## 2017-08-11 LAB — I-STAT TROPONIN, ED: Troponin i, poc: 0.01 ng/mL (ref 0.00–0.08)

## 2017-08-11 LAB — COMPREHENSIVE METABOLIC PANEL
ALBUMIN: 4 g/dL (ref 3.5–5.0)
ALK PHOS: 60 U/L (ref 38–126)
ALT: 16 U/L (ref 0–44)
AST: 19 U/L (ref 15–41)
Anion gap: 9 (ref 5–15)
BUN: 34 mg/dL — AB (ref 8–23)
CO2: 26 mmol/L (ref 22–32)
Calcium: 9.7 mg/dL (ref 8.9–10.3)
Chloride: 101 mmol/L (ref 98–111)
Creatinine, Ser: 1.28 mg/dL — ABNORMAL HIGH (ref 0.44–1.00)
GFR calc Af Amer: 47 mL/min — ABNORMAL LOW (ref >60)
GFR calc non Af Amer: 40 mL/min — ABNORMAL LOW (ref >60)
GLUCOSE: 118 mg/dL — AB (ref 70–99)
Potassium: 3.7 mmol/L (ref 3.5–5.1)
SODIUM: 136 mmol/L (ref 135–145)
Total Bilirubin: 0.7 mg/dL (ref 0.3–1.2)
Total Protein: 7.7 g/dL (ref 6.5–8.1)

## 2017-08-11 LAB — PROTIME-INR
INR: 0.95
Prothrombin Time: 12.5 seconds (ref 11.4–15.2)

## 2017-08-11 LAB — TYPE AND SCREEN
ABO/RH(D): B POS
Antibody Screen: NEGATIVE

## 2017-08-11 MED ORDER — GUAIFENESIN ER 600 MG PO TB12: 600.0000 mg | ORAL_TABLET | Freq: Two times a day (BID) | ORAL | Status: DC | PRN

## 2017-08-11 MED ORDER — DIPHENHYDRAMINE-APAP (SLEEP) 25-500 MG PO TABS
1.0000 | ORAL_TABLET | Freq: Every evening | ORAL | Status: DC | PRN
Start: 2017-08-11 — End: 2017-08-12

## 2017-08-11 MED ORDER — CYCLOBENZAPRINE HCL 5 MG PO TABS
7.5000 mg | ORAL_TABLET | Freq: Three times a day (TID) | ORAL | Status: DC | PRN
  Administered 2017-08-12: 7.5 mg via ORAL
  Filled 2017-08-11: qty 1.5

## 2017-08-11 MED ORDER — ATORVASTATIN CALCIUM 40 MG PO TABS
40.0000 mg | ORAL_TABLET | Freq: Every day | ORAL | Status: DC
  Administered 2017-08-12 – 2017-08-13 (×2): 40 mg via ORAL
  Filled 2017-08-11 (×4): qty 1

## 2017-08-11 MED ORDER — HYDROMORPHONE HCL 1 MG/ML IJ SOLN
1.0000 mg | Freq: Once | INTRAMUSCULAR | Status: AC
  Administered 2017-08-11: 1 mg via INTRAVENOUS
  Filled 2017-08-11: qty 1

## 2017-08-11 MED ORDER — MORPHINE SULFATE (PF) 4 MG/ML IV SOLN
4.0000 mg | Freq: Once | INTRAVENOUS | Status: AC
  Administered 2017-08-11: 4 mg via INTRAVENOUS
  Filled 2017-08-11: qty 1

## 2017-08-11 MED ORDER — ALBUTEROL SULFATE HFA 108 (90 BASE) MCG/ACT IN AERS: 2.0000 | INHALATION_SPRAY | Freq: Four times a day (QID) | RESPIRATORY_TRACT | Status: DC | PRN

## 2017-08-11 MED ORDER — SERTRALINE HCL 100 MG PO TABS
100.0000 mg | ORAL_TABLET | Freq: Every day | ORAL | Status: DC
  Administered 2017-08-13 – 2017-08-15 (×3): 100 mg via ORAL
  Filled 2017-08-11 (×3): qty 1

## 2017-08-11 MED ORDER — ACETAMINOPHEN 650 MG RE SUPP: 650.0000 mg | Freq: Four times a day (QID) | RECTAL | Status: DC | PRN

## 2017-08-11 MED ORDER — ONDANSETRON HCL 4 MG PO TABS: 4.0000 mg | ORAL_TABLET | Freq: Four times a day (QID) | ORAL | Status: DC | PRN

## 2017-08-11 MED ORDER — OXYCODONE HCL 5 MG PO TABS
10.0000 mg | ORAL_TABLET | ORAL | Status: DC | PRN
  Administered 2017-08-12: 10 mg via ORAL
  Filled 2017-08-11: qty 2

## 2017-08-11 MED ORDER — FLUTICASONE FUROATE-VILANTEROL 100-25 MCG/INH IN AEPB
1.0000 | INHALATION_SPRAY | Freq: Every day | RESPIRATORY_TRACT | Status: DC
  Filled 2017-08-11: qty 28

## 2017-08-11 MED ORDER — VITAMIN D 1000 UNITS PO TABS
2000.0000 [IU] | ORAL_TABLET | Freq: Every day | ORAL | Status: DC
  Administered 2017-08-14 – 2017-08-15 (×2): 2000 [IU] via ORAL
  Filled 2017-08-11 (×4): qty 2

## 2017-08-11 MED ORDER — TRAZODONE HCL 50 MG PO TABS: 25.0000 mg | ORAL_TABLET | Freq: Every evening | ORAL | Status: DC | PRN

## 2017-08-11 MED ORDER — ONDANSETRON HCL 4 MG/2ML IJ SOLN: 4.0000 mg | Freq: Four times a day (QID) | INTRAMUSCULAR | Status: DC | PRN

## 2017-08-11 MED ORDER — SODIUM CHLORIDE 0.9 % IV SOLN
INTRAVENOUS | Status: DC
  Administered 2017-08-11: 23:00:00 via INTRAVENOUS

## 2017-08-11 MED ORDER — LOPERAMIDE HCL 2 MG PO CAPS: 2.0000 mg | ORAL_CAPSULE | ORAL | Status: DC | PRN

## 2017-08-11 MED ORDER — ACETAMINOPHEN 325 MG PO TABS
650.0000 mg | ORAL_TABLET | Freq: Four times a day (QID) | ORAL | Status: DC | PRN
  Filled 2017-08-11: qty 2

## 2017-08-11 MED ORDER — LORATADINE 10 MG PO TABS: 10.0000 mg | ORAL_TABLET | Freq: Every day | ORAL | Status: DC | PRN

## 2017-08-11 MED ORDER — TIOTROPIUM BROMIDE MONOHYDRATE 18 MCG IN CAPS
18.0000 ug | ORAL_CAPSULE | Freq: Every day | RESPIRATORY_TRACT | Status: DC
  Filled 2017-08-11: qty 5

## 2017-08-11 MED ORDER — PANTOPRAZOLE SODIUM 40 MG PO TBEC
40.0000 mg | DELAYED_RELEASE_TABLET | Freq: Every day | ORAL | Status: DC
  Administered 2017-08-13 – 2017-08-15 (×3): 40 mg via ORAL
  Filled 2017-08-11 (×3): qty 1

## 2017-08-11 MED ORDER — CALCIUM CARBONATE ANTACID 500 MG PO CHEW: 1.0000 | CHEWABLE_TABLET | Freq: Every day | ORAL | Status: DC | PRN

## 2017-08-11 MED ORDER — POLYETHYLENE GLYCOL 3350 17 G PO PACK
17.0000 g | PACK | Freq: Every day | ORAL | Status: DC
  Administered 2017-08-13 – 2017-08-14 (×2): 17 g via ORAL
  Filled 2017-08-11 (×3): qty 1

## 2017-08-11 MED ORDER — ALBUTEROL SULFATE (2.5 MG/3ML) 0.083% IN NEBU: 2.5000 mg | INHALATION_SOLUTION | Freq: Four times a day (QID) | RESPIRATORY_TRACT | Status: DC | PRN

## 2017-08-11 MED ORDER — MORPHINE SULFATE (PF) 2 MG/ML IV SOLN
2.0000 mg | INTRAVENOUS | Status: DC | PRN
  Administered 2017-08-12 (×2): 2 mg via INTRAVENOUS
  Filled 2017-08-11 (×3): qty 1

## 2017-08-11 NOTE — H&P (Signed)
History and Physical  Robyn Barton ZOX:[id number] DOB: 1945-03-06 DOA: 08/11/2017   PCP: Shelda Pal, DO   Patient coming from: Home via EMS   Chief Complaint: Left hip pain after mechanical fall   HPI: Robyn Barton is a 72 y.o. 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 She lives alone and was at home in her kitchen today when she picked up some shoes off the floor and somehow fell, she didn't lose consciousness or hit her head but doesn't remember how exactly she fell. She had immediate left hip pain and was unable to ambulate. She was able to contact EMS who transferred her here. She says that in the last year since she got chemo she has been having issues with lots of fatigue, numbness and tingling in her feet and hands and more shortness of breath with exertion. She does have a history of COPD but denies any recent colds, fevers, chills, increased cough, chest pain. No sick contacts, nausea, diarrhea, etc.    ED Course: In the ER she was found to have left femoral neck fracture. Dr. Erlinda Hong, orthopedist on call, recommends hospitalist admission to [facility] with plans for surgical correction in AM.  Review of Systems: Please see HPI for pertinent positives and negatives. A complete 10 system review of systems are otherwise negative.  Past Medical History:  Diagnosis Date  . Allergic rhinitis   . Anxiety   . Elevated TSH    pt denies  . Emphysema lung ([place])   . Enlarged lymph nodes    in chest  . GERD (gastroesophageal reflux disease)   . Goals of care, counseling/discussion 10/21/2016  . History of hiatal hernia   . History of radiation therapy 03/10/17- 03/21/17   Whole brain, prophylactic, radiation 25 Gy in 10 fractions  . History of radiation therapy 11/18/16- 01/16/17   59.4 Gy to lung in 33 fractions.  .  Hyperlipidemia   . IFG (impaired fasting glucose)   . Osteopenia   . Recurrent major depressive disorder ([place])   . Sleep apnea    mild no cpap needed  . Small cell carcinoma of right lung ([place]) 2018   surgery right middle lobe removed   . Tobacco dependence   . Varicose veins of both lower extremities   . Vitamin D deficiency    Past Surgical History:  Procedure Laterality Date  . ABDOMINAL HYSTERECTOMY     1 ovary left  . ENDOBRONCHIAL ULTRASOUND Bilateral 10/15/2016   Procedure: ENDOBRONCHIAL ULTRASOUND;  Surgeon: Collene Gobble, MD;  Location: [facility];  Service: Cardiopulmonary;  Laterality: Bilateral;  . INCONTINENCE SURGERY     bladder sling  . right middle lobe lung surgery  06/26/2016   [facility]    Social History:  reports that she quit smoking about 13 months ago. Her smoking use included cigarettes. She has a 75.00 pack-year smoking history. She has never used smokeless tobacco. She reports that she drinks alcohol. She reports that she does not use drugs.   No Known Allergies  Family History  Problem Relation Age of Onset  . Depression Mother   . Cancer Father   . COPD Father   . Heart disease Father   . High blood pressure Father   . High Cholesterol Father      Prior to Admission medications   Medication Sig  Start Date End Date Taking? Authorizing Provider  albuterol (PROVENTIL HFA;VENTOLIN HFA) 108 (90 Base) MCG/ACT inhaler Inhale 2 puffs into the lungs every 6 (six) hours as needed for wheezing or shortness of breath. 04/23/17  Yes Shelda Pal, DO  alendronate (FOSAMAX) 70 MG tablet Take 70 mg by mouth every Monday. Take with a full glass of water on an empty stomach.    Yes [provider]  atorvastatin (LIPITOR) 40 MG tablet Take 40 mg by mouth at bedtime.    Yes [provider]  Cholecalciferol (VITAMIN D) 2000 units tablet Take 2,000 Units by mouth daily.   Yes [provider]  fluticasone furoate-vilanterol (BREO  ELLIPTA) 100-25 MCG/INH AEPB Inhale 1 puff into the lungs daily. 05/12/17  Yes Wendling, Crosby Oyster, DO  guaiFENesin (MUCINEX) 600 MG 12 hr tablet Take 600 mg by mouth 2 (two) times daily as needed for cough or to loosen phlegm.   Yes [provider]  omeprazole (PRILOSEC) 40 MG capsule Take 40 mg by mouth daily.  08/01/16  Yes [provider]  ondansetron (ZOFRAN) 8 MG tablet TAKE 1 TABLET (8 MG TOTAL) BY MOUTH EVERY 8 (EIGHT) HOURS AS NEEDED FOR UP TO 7 DAYS FOR NAUSEA. 07/24/17  Yes [provider]  polyethylene glycol (MIRALAX / GLYCOLAX) packet Take 17 g by mouth daily.   Yes [provider]  sertraline (ZOLOFT) 100 MG tablet Take 1 tablet (100 mg total) by mouth daily. 02/20/17  Yes Shelda Pal, DO  tiotropium (SPIRIVA HANDIHALER) 18 MCG inhalation capsule Place 1 capsule (18 mcg total) into inhaler and inhale daily. 04/23/17  Yes Shelda Pal, DO  calcium carbonate (TUMS - DOSED IN MG ELEMENTAL CALCIUM) 500 MG chewable tablet Chew 1 tablet daily as needed by mouth for indigestion or heartburn.    [provider]  diphenhydramine-acetaminophen (TYLENOL PM) 25-500 MG TABS tablet Take 1 tablet at bedtime as needed by mouth (sleep).    [provider]  loperamide (IMODIUM) 2 MG capsule Take 2 mg as needed by mouth for diarrhea or loose stools.    [provider]  loratadine (CLARITIN) 10 MG tablet Take 10 mg by mouth daily as needed (hay fever).    [provider]    Physical Exam: BP 126/77 (BP Location: Left Arm)   Pulse 84   Temp 98.5 F (36.9 C) (Oral)   Resp 20   Ht 5\' 5"  (1.651 m)   Wt 50.8 kg (112 lb)   SpO2 96%   BMI 18.64 kg/m   General:  Alert, oriented, calm, but intermittently cries out in pain Eyes: EOMI, clear conjuctivae, white sclerea Neck: supple, no masses, trachea mildline  Cardiovascular: RRR, no murmurs or rubs, no peripheral edema  Respiratory: clear to auscultation  bilaterally, no wheezes, no crackles  Abdomen: soft, nontender, nondistended, normal bowel tones heard  Skin: dry, no rashes  Musculoskeletal: no joint effusions, left leg is foreshortened and externally rotated Psychiatric: appropriate affect, normal speech  Neurologic: extraocular muscles intact, clear speech, moving all extremities with intact sensorium            Labs on Admission:  Basic Metabolic Panel: Recent Labs  Lab 08/11/17 2128  NA 136  K 3.7  CL 101  CO2 26  GLUCOSE 118*  BUN 34*  CREATININE 1.28*  CALCIUM 9.7   Liver Function Tests: Recent Labs  Lab 08/11/17 2128  AST 19  ALT 16  ALKPHOS 60  BILITOT 0.7  PROT  7.7  ALBUMIN 4.0   No results for input(s): LIPASE, AMYLASE in the last 168 hours. No results for input(s): AMMONIA in the last 168 hours. CBC: Recent Labs  Lab 08/11/17 2128  WBC 10.9*  NEUTROABS 9.5*  HGB 11.6*  HCT 35.1*  MCV 88.4  PLT 227   Cardiac Enzymes: No results for input(s): CKTOTAL, CKMB, CKMBINDEX, TROPONINI in the last 168 hours.  BNP (last 3 results) No results for input(s): BNP in the last 8760 hours.  ProBNP (last 3 results) No results for input(s): PROBNP in the last 8760 hours.  CBG: No results for input(s): GLUCAP in the last 168 hours.  Radiological Exams on Admission: Dg Chest Port 1 View  Result Date: 08/11/2017 CLINICAL DATA:  Preop, hip fracture EXAM: PORTABLE CHEST 1 VIEW COMPARISON:  CT chest dated 08/01/2017 FINDINGS: Postsurgical changes related to right middle lobectomy and radiation changes in the right hemithorax. Left lung is clear. No pleural effusion or pneumothorax. Rightward cardiomediastinal shift. IMPRESSION: Postsurgical and post radiation changes in the right hemithorax, better evaluated on recent CT. No evidence of acute cardiopulmonary disease. Electronically Signed   By: Julian Hy M.D.   On: 08/11/2017 21:32   Dg Hip Unilat With Pelvis 2-3 Views Left  Result Date:  08/11/2017 CLINICAL DATA:  [place].  Pain. EXAM: DG HIP (WITH OR WITHOUT PELVIS) 2-3V LEFT COMPARISON:  None. FINDINGS: There is an acute transverse displaced subcapital LEFT femoral neck fracture, with foreshortening. Pelvis is intact. Skeletal osteopenia is suspected. IMPRESSION: Acute LEFT subcapital femoral neck fracture with foreshortening. Electronically Signed   By: Staci Righter M.D.   On: 08/11/2017 20:53    EKG: Independently reviewed. Sinus rhythm.  Assessment/Plan Present on Admission: . Hip fracture ([place]) . Small cell lung cancer, right upper lobe ([place]) . COPD (chronic obstructive pulmonary disease) ([place]) . Leukocytosis . Anemia due to antineoplastic chemotherapy . Depression, recurrent ([place]) . CKD (chronic kidney disease), stage III ([place])  72 year old female with history of COPD and small cell lung CA of right upper lobe now in remission who is being admitted to [facility] with left femoral neck fracture after mechanical fall.  Principal Problem:   Hip fracture ([place]) - left femoral neck fracture, not on anticoagulants.  - inpatient admission to [facility] - NPO after midnight - no evidence of acute cardiopulmonary process she appears to be at baseline and no further workup or risk stratification is necessary prior to operative repair of her left hip  Active Problems:   Small cell lung cancer, right upper lobe (HCC)   COPD (chronic obstructive pulmonary disease) (HCC) - continue inhaled chronic medications and PRN albuterol. No evidence of acute exacerbation.   Leukocytosis   Anemia due to antineoplastic chemotherapy   Depression, recurrent (HCC)   CKD (chronic kidney disease), stage III (HCC)  DVT prophylaxis: SCDs   Code Status: FULL   Family Communication: None present, patient has communicated plan with her son who lives in [place], [place].   Disposition Plan: Likely SNF at [place] in 2-3 days.   Consults called: Dr. Erlinda Hong Orthopedics   Admission status: Inpatient to [facility]   Time spent: 48 minutes  Mir Marry Guan MD [facility] Pager [phone]  If 7PM-7AM, please contact night-coverage [email or web address] Password [facility]  08/11/2017, 11:15 PM

## 2017-08-11 NOTE — ED Provider Notes (Signed)
[facility] DEPT Provider Note   CSN: [id number] Arrival date & time: 08/11/17  1925     History   Chief Complaint Chief Complaint  Patient presents with  . Fall    HPI Robyn Barton is a 72 y.o. female hx of lung cancer s/p resection in remission, here presenting with fall.  Patient states that she was in the kitchen and she did not quite remember what happened and fell and laid on her left hip.  She was not sure if she hit her head or not but denies any headache or vomiting afterwards.  Patient denies passing out but could not tell me exactly how she fell.  Patient was unable to get up afterwards.  EMS was called and noticed that last hip seems to be rotated and was concerned for possible dislocation or fracture.  She was given 150 mcg of fentanyl prior to arrival.   The history is provided by the patient.    Past Medical History:  Diagnosis Date  . Allergic rhinitis   . Anxiety   . Elevated TSH    pt denies  . Emphysema lung ([place])   . Enlarged lymph nodes    in chest  . GERD (gastroesophageal reflux disease)   . Goals of care, counseling/discussion 10/21/2016  . History of hiatal hernia   . History of radiation therapy 03/10/17- 03/21/17   Whole brain, prophylactic, radiation 25 Gy in 10 fractions  . History of radiation therapy 11/18/16- 01/16/17   59.4 Gy to lung in 33 fractions.  . Hyperlipidemia   . IFG (impaired fasting glucose)   . Osteopenia   . Recurrent major depressive disorder ([place])   . Sleep apnea    mild no cpap needed  . Small cell carcinoma of right lung ([place]) 2018   surgery right middle lobe removed   . Tobacco dependence   . Varicose veins of both lower extremities   . Vitamin D deficiency     Patient Active Problem List   Diagnosis Date Noted  . Paresthesia of both feet 04/17/2017  . Osteopenia 04/17/2017  . Depression, recurrent ([place]) 02/20/2017  . Anemia due to antineoplastic chemotherapy 12/18/2016  . Anxiety  11/26/2016  . Leukocytosis 11/24/2016  . Dyspnea 11/15/2016  . Goals of care, counseling/discussion 10/21/2016  . Encounter for antineoplastic chemotherapy 10/21/2016  . Mediastinal lymphadenopathy   . Small cell lung cancer, right upper lobe ([place]) 08/20/2016  . COPD (chronic obstructive pulmonary disease) ([place]) 08/20/2016  . ARDS (adult respiratory distress syndrome) ([place]) 08/20/2016    Past Surgical History:  Procedure Laterality Date  . ABDOMINAL HYSTERECTOMY     1 ovary left  . ENDOBRONCHIAL ULTRASOUND Bilateral 10/15/2016   Procedure: ENDOBRONCHIAL ULTRASOUND;  Surgeon: Collene Gobble, MD;  Location: [facility];  Service: Cardiopulmonary;  Laterality: Bilateral;  . INCONTINENCE SURGERY     bladder sling  . right middle lobe lung surgery  06/26/2016   [facility]     OB History   None      Home Medications    Prior to Admission medications   Medication Sig Start Date End Date Taking? Authorizing Provider  albuterol (PROVENTIL HFA;VENTOLIN HFA) 108 (90 Base) MCG/ACT inhaler Inhale 2 puffs into the lungs every 6 (six) hours as needed for wheezing or shortness of breath. 04/23/17  Yes Shelda Pal, DO  alendronate (FOSAMAX) 70 MG tablet Take 70 mg by mouth every Monday. Take with a full glass of water on an empty stomach.  Yes [provider]  atorvastatin (LIPITOR) 40 MG tablet Take 40 mg by mouth at bedtime.    Yes [provider]  Cholecalciferol (VITAMIN D) 2000 units tablet Take 2,000 Units by mouth daily.   Yes [provider]  fluticasone furoate-vilanterol (BREO ELLIPTA) 100-25 MCG/INH AEPB Inhale 1 puff into the lungs daily. 05/12/17  Yes Wendling, Crosby Oyster, DO  guaiFENesin (MUCINEX) 600 MG 12 hr tablet Take 600 mg by mouth 2 (two) times daily as needed for cough or to loosen phlegm.   Yes [provider]  omeprazole (PRILOSEC) 40 MG capsule Take 40 mg by mouth daily.  08/01/16  Yes [provider]    ondansetron (ZOFRAN) 8 MG tablet TAKE 1 TABLET (8 MG TOTAL) BY MOUTH EVERY 8 (EIGHT) HOURS AS NEEDED FOR UP TO 7 DAYS FOR NAUSEA. 07/24/17  Yes [provider]  polyethylene glycol (MIRALAX / GLYCOLAX) packet Take 17 g by mouth daily.   Yes [provider]  sertraline (ZOLOFT) 100 MG tablet Take 1 tablet (100 mg total) by mouth daily. 02/20/17  Yes Shelda Pal, DO  tiotropium (SPIRIVA HANDIHALER) 18 MCG inhalation capsule Place 1 capsule (18 mcg total) into inhaler and inhale daily. 04/23/17  Yes Shelda Pal, DO  calcium carbonate (TUMS - DOSED IN MG ELEMENTAL CALCIUM) 500 MG chewable tablet Chew 1 tablet daily as needed by mouth for indigestion or heartburn.    [provider]  diphenhydramine-acetaminophen (TYLENOL PM) 25-500 MG TABS tablet Take 1 tablet at bedtime as needed by mouth (sleep).    [provider]  loperamide (IMODIUM) 2 MG capsule Take 2 mg as needed by mouth for diarrhea or loose stools.    [provider]  loratadine (CLARITIN) 10 MG tablet Take 10 mg by mouth daily as needed (hay fever).    [provider]    Family History Family History  Problem Relation Age of Onset  . Depression Mother   . Cancer Father   . COPD Father   . Heart disease Father   . High blood pressure Father   . High Cholesterol Father     Social History Social History   Tobacco Use  . Smoking status: Former Smoker    Packs/day: 1.50    Years: 50.00    Pack years: 75.00    Types: Cigarettes    Last attempt to quit: 06/14/2016    Years since quitting: 1.1  . Smokeless tobacco: Never Used  Substance Use Topics  . Alcohol use: Yes    Comment: rare  . Drug use: No     Allergies   Patient has no known allergies.   Review of Systems Review of Systems  Musculoskeletal:       L hip pain   All other systems reviewed and are negative.    Physical Exam Updated Vital Signs BP 126/77 (BP Location: Left Arm)    Pulse 84   Temp 98.5 F (36.9 C) (Oral)   Resp 20   Ht 5\' 5"  (1.651 m)   Wt 50.8 kg (112 lb)   SpO2 96%   BMI 18.64 kg/m   Physical Exam  HENT:  Head: Normocephalic and atraumatic.  No scalp hematoma   Eyes: Pupils are equal, round, and reactive to light. Conjunctivae and EOM are normal.  Neck: Normal range of motion. Neck supple.  Cardiovascular: Normal rate, regular rhythm and normal heart sounds.  Pulmonary/Chest: Effort normal and breath sounds normal. No stridor. No respiratory distress.  She has no wheezes.  Abdominal: Soft. Bowel sounds are normal. She exhibits no distension. There is no tenderness.  Musculoskeletal:  L hip internally rotated. Able to wiggle toes, 2+ pulses   Neurological: She is alert.  Skin: Skin is warm.  Psychiatric: She has a normal mood and affect.  Nursing note and vitals reviewed.    ED Treatments / Results  Labs (all labs ordered are listed, but only abnormal results are displayed) Labs Reviewed  CBC WITH DIFFERENTIAL/PLATELET - Abnormal; Notable for the following components:      Result Value   WBC 10.9 (*)    Hemoglobin 11.6 (*)    HCT 35.1 (*)    RDW 15.9 (*)    Neutro Abs 9.5 (*)    All other components within normal limits  COMPREHENSIVE METABOLIC PANEL - Abnormal; Notable for the following components:   Glucose, Bld 118 (*)    BUN 34 (*)    Creatinine, Ser 1.28 (*)    GFR calc non Af Amer 40 (*)    GFR calc Af Amer 47 (*)    All other components within normal limits  PROTIME-INR  I-STAT TROPONIN, ED  TYPE AND SCREEN    EKG EKG Interpretation  Date/Time:  Monday August 11 2017 20:59:40 EDT Ventricular Rate:  88 PR Interval:    QRS Duration: 80 QT Interval:  380 QTC Calculation: 463 R Axis:   38 Text Interpretation:  Age not entered, assumed to be  72 years old for purpose of ECG interpretation Sinus rhythm Probable left atrial enlargement No significant change since last tracing Confirmed by Wandra Arthurs ([id number]) on  08/11/2017 9:19:22 PM   Radiology Dg Chest Port 1 View  Result Date: 08/11/2017 CLINICAL DATA:  Preop, hip fracture EXAM: PORTABLE CHEST 1 VIEW COMPARISON:  CT chest dated 08/01/2017 FINDINGS: Postsurgical changes related to right middle lobectomy and radiation changes in the right hemithorax. Left lung is clear. No pleural effusion or pneumothorax. Rightward cardiomediastinal shift. IMPRESSION: Postsurgical and post radiation changes in the right hemithorax, better evaluated on recent CT. No evidence of acute cardiopulmonary disease. Electronically Signed   By: Julian Hy M.D.   On: 08/11/2017 21:32   Dg Hip Unilat With Pelvis 2-3 Views Left  Result Date: 08/11/2017 CLINICAL DATA:  [place].  Pain. EXAM: DG HIP (WITH OR WITHOUT PELVIS) 2-3V LEFT COMPARISON:  None. FINDINGS: There is an acute transverse displaced subcapital LEFT femoral neck fracture, with foreshortening. Pelvis is intact. Skeletal osteopenia is suspected. IMPRESSION: Acute LEFT subcapital femoral neck fracture with foreshortening. Electronically Signed   By: Staci Righter M.D.   On: 08/11/2017 20:53    Procedures Procedures (including critical care time)  Medications Ordered in ED Medications  HYDROmorphone (DILAUDID) injection 1 mg (1 mg Intravenous Given 08/11/17 2103)     Initial Impression / Assessment and Plan / ED Course  I have reviewed the triage vital signs and the nursing notes.  Pertinent labs & imaging results that were available during my care of the patient were reviewed by me and considered in my medical decision making (see chart for details).     Robyn Barton is a 72 y.o. female here with L hip pain after fall. Unclear if she had a syncope or not. Likely has L hip fracture. Will get preop labs, EKG.   11:06 PM Labs unremarkable. Xray showed L femoral neck fracture. I called Dr. Erlinda Hong from ortho, who recommend hospitalist admission and transfer to [facility] and NPO after  midnight. He plans to do surgery in  AM.    Final Clinical Impressions(s) / ED Diagnoses   Final diagnoses:  None    ED Discharge Orders    None       Drenda Freeze, MD 08/11/17 2307

## 2017-08-11 NOTE — ED Notes (Signed)
Bed: MW41 Expected date:  Expected time:  Means of arrival:  Comments: EMS 72 yo female from home-fall-possible dislocation left hip-no previous hx-no blood thinners ST

## 2017-08-11 NOTE — ED Triage Notes (Signed)
Pt was brought by [facility] due to a fall at home. Pt c/o left hip pain. Denies LOC or any previous injury. Pt denies taking blood thinners. Hx of lung cancer, no longer on treatment.   20 G left hand 150 mcg Fentanyl

## 2017-08-11 NOTE — ED Notes (Signed)
Patient transported to X-ray 

## 2017-08-12 ENCOUNTER — Inpatient Hospital Stay (HOSPITAL_COMMUNITY): Payer: Medicare Other | Admitting: Certified Registered Nurse Anesthetist

## 2017-08-12 ENCOUNTER — Encounter (HOSPITAL_COMMUNITY)
Admission: EM | Disposition: A | Discharge status: Skilled Nursing Facility | Payer: Self-pay | Source: Home / Self Care | Attending: Family Medicine

## 2017-08-12 ENCOUNTER — Inpatient Hospital Stay (HOSPITAL_COMMUNITY): Payer: Medicare Other

## 2017-08-12 ENCOUNTER — Encounter (HOSPITAL_COMMUNITY): Payer: Self-pay | Admitting: Certified Registered Nurse Anesthetist

## 2017-08-12 DIAGNOSIS — C3411 Malignant neoplasm of upper lobe, right bronchus or lung: Secondary | ICD-10-CM

## 2017-08-12 DIAGNOSIS — D72829 Elevated white blood cell count, unspecified: Secondary | ICD-10-CM

## 2017-08-12 DIAGNOSIS — N183 Chronic kidney disease, stage 3 (moderate): Secondary | ICD-10-CM

## 2017-08-12 DIAGNOSIS — T451X5A Adverse effect of antineoplastic and immunosuppressive drugs, initial encounter: Secondary | ICD-10-CM

## 2017-08-12 DIAGNOSIS — J449 Chronic obstructive pulmonary disease, unspecified: Secondary | ICD-10-CM

## 2017-08-12 DIAGNOSIS — S72042A Displaced fracture of base of neck of left femur, initial encounter for closed fracture: Secondary | ICD-10-CM

## 2017-08-12 DIAGNOSIS — D6481 Anemia due to antineoplastic chemotherapy: Secondary | ICD-10-CM

## 2017-08-12 DIAGNOSIS — F339 Major depressive disorder, recurrent, unspecified: Secondary | ICD-10-CM

## 2017-08-12 HISTORY — PX: ANTERIOR APPROACH HEMI HIP ARTHROPLASTY: SHX6690

## 2017-08-12 LAB — CBC
HCT: 27.5 % — ABNORMAL LOW (ref 36.0–46.0)
HEMATOCRIT: 34.2 % — AB (ref 36.0–46.0)
HEMOGLOBIN: 8.8 g/dL — AB (ref 12.0–15.0)
Hemoglobin: 11.3 g/dL — ABNORMAL LOW (ref 12.0–15.0)
MCH: 29.6 pg (ref 26.0–34.0)
MCH: 29.7 pg (ref 26.0–34.0)
MCHC: 33 g/dL (ref 30.0–36.0)
MCHC: 32 g/dL (ref 30.0–36.0)
MCV: 89.5 fL (ref 78.0–100.0)
MCV: 92.9 fL (ref 78.0–100.0)
PLATELETS: 209 10*3/uL (ref 150–400)
Platelets: 147 10*3/uL — ABNORMAL LOW (ref 150–400)
RBC: 3.82 MIL/uL — ABNORMAL LOW (ref 3.87–5.11)
RBC: 2.96 MIL/uL — AB (ref 3.87–5.11)
RDW: 16 % — AB (ref 11.5–15.5)
RDW: 15.7 % — ABNORMAL HIGH (ref 11.5–15.5)
WBC: 7.4 10*3/uL (ref 4.0–10.5)
WBC: 9.7 10*3/uL (ref 4.0–10.5)

## 2017-08-12 LAB — CREATININE, SERUM
Creatinine, Ser: 1.21 mg/dL — ABNORMAL HIGH (ref 0.44–1.00)
GFR calc Af Amer: 50 mL/min — ABNORMAL LOW (ref >60)
GFR calc non Af Amer: 43 mL/min — ABNORMAL LOW (ref >60)

## 2017-08-12 LAB — BASIC METABOLIC PANEL
Anion gap: 6 (ref 5–15)
BUN: 29 mg/dL — ABNORMAL HIGH (ref 8–23)
CHLORIDE: 103 mmol/L (ref 98–111)
CO2: 29 mmol/L (ref 22–32)
CREATININE: 1.2 mg/dL — AB (ref 0.44–1.00)
Calcium: 9.6 mg/dL (ref 8.9–10.3)
GFR calc non Af Amer: 43 mL/min — ABNORMAL LOW (ref >60)
GFR, EST AFRICAN AMERICAN: 50 mL/min — AB (ref >60)
GLUCOSE: 109 mg/dL — AB (ref 70–99)
Potassium: 4.4 mmol/L (ref 3.5–5.1)
SODIUM: 138 mmol/L (ref 135–145)

## 2017-08-12 LAB — ABO/RH: ABO/RH(D): B POS

## 2017-08-12 SURGERY — HEMIARTHROPLASTY, HIP, DIRECT ANTERIOR APPROACH, FOR FRACTURE
Anesthesia: Spinal | Site: Hip | Laterality: Left

## 2017-08-12 MED ORDER — LEVALBUTEROL HCL 0.63 MG/3ML IN NEBU
0.6300 mg | INHALATION_SOLUTION | Freq: Two times a day (BID) | RESPIRATORY_TRACT | Status: DC
  Administered 2017-08-13: 0.63 mg via RESPIRATORY_TRACT
  Filled 2017-08-12 (×3): qty 3

## 2017-08-12 MED ORDER — MEPERIDINE HCL 50 MG/ML IJ SOLN: 6.2500 mg | INTRAMUSCULAR | Status: DC | PRN

## 2017-08-12 MED ORDER — METHOCARBAMOL 500 MG PO TABS
ORAL_TABLET | ORAL | Status: AC
  Filled 2017-08-12: qty 1

## 2017-08-12 MED ORDER — MIDAZOLAM HCL 5 MG/5ML IJ SOLN
INTRAMUSCULAR | Status: DC | PRN
  Administered 2017-08-12: 1 mg via INTRAVENOUS

## 2017-08-12 MED ORDER — MORPHINE SULFATE (PF) 2 MG/ML IV SOLN
1.0000 mg | INTRAVENOUS | Status: DC | PRN
  Administered 2017-08-12 – 2017-08-14 (×3): 1 mg via INTRAVENOUS
  Filled 2017-08-12 (×4): qty 1

## 2017-08-12 MED ORDER — ALUM & MAG HYDROXIDE-SIMETH 200-200-20 MG/5ML PO SUSP: 30.0000 mL | ORAL | Status: DC | PRN

## 2017-08-12 MED ORDER — ACETAMINOPHEN 325 MG PO TABS: 325.0000 mg | ORAL_TABLET | Freq: Four times a day (QID) | ORAL | Status: DC | PRN

## 2017-08-12 MED ORDER — ONDANSETRON HCL 4 MG PO TABS: 4.0000 mg | ORAL_TABLET | Freq: Four times a day (QID) | ORAL | Status: DC | PRN

## 2017-08-12 MED ORDER — TRANEXAMIC ACID 1000 MG/10ML IV SOLN
2000.0000 mg | Freq: Once | INTRAVENOUS | Status: DC
  Filled 2017-08-12: qty 20

## 2017-08-12 MED ORDER — 0.9 % SODIUM CHLORIDE (POUR BTL) OPTIME
TOPICAL | Status: DC | PRN
  Administered 2017-08-12: 1000 mL

## 2017-08-12 MED ORDER — SODIUM CHLORIDE 0.9 % IV SOLN
INTRAVENOUS | Status: DC | PRN
  Administered 2017-08-12: 25 ug/min via INTRAVENOUS

## 2017-08-12 MED ORDER — METHOCARBAMOL 500 MG PO TABS
500.0000 mg | ORAL_TABLET | Freq: Four times a day (QID) | ORAL | Status: DC | PRN
  Administered 2017-08-12 – 2017-08-14 (×2): 500 mg via ORAL
  Filled 2017-08-12: qty 1

## 2017-08-12 MED ORDER — PHENOL 1.4 % MT LIQD: 1.0000 | OROMUCOSAL | Status: DC | PRN

## 2017-08-12 MED ORDER — ACETAMINOPHEN 500 MG PO TABS
500.0000 mg | ORAL_TABLET | Freq: Four times a day (QID) | ORAL | Status: AC
  Administered 2017-08-12: 500 mg via ORAL
  Filled 2017-08-12: qty 1

## 2017-08-12 MED ORDER — LEVALBUTEROL HCL 0.63 MG/3ML IN NEBU
0.6300 mg | INHALATION_SOLUTION | Freq: Four times a day (QID) | RESPIRATORY_TRACT | Status: DC
  Administered 2017-08-12: 0.63 mg via RESPIRATORY_TRACT
  Filled 2017-08-12: qty 3

## 2017-08-12 MED ORDER — SODIUM CHLORIDE 0.9 % IR SOLN
Status: DC | PRN
  Administered 2017-08-12: 3000 mL

## 2017-08-12 MED ORDER — ENOXAPARIN SODIUM 40 MG/0.4ML ~~LOC~~ SOLN
40.0000 mg | SUBCUTANEOUS | Status: DC
  Administered 2017-08-13 – 2017-08-15 (×3): 40 mg via SUBCUTANEOUS
  Filled 2017-08-12 (×3): qty 0.4

## 2017-08-12 MED ORDER — PHENYLEPHRINE 40 MCG/ML (10ML) SYRINGE FOR IV PUSH (FOR BLOOD PRESSURE SUPPORT)
PREFILLED_SYRINGE | INTRAVENOUS | Status: DC | PRN
  Administered 2017-08-12: 80 ug via INTRAVENOUS
  Administered 2017-08-12: 40 ug via INTRAVENOUS
  Administered 2017-08-12: 80 ug via INTRAVENOUS
  Administered 2017-08-12: 40 ug via INTRAVENOUS
  Administered 2017-08-12: 80 ug via INTRAVENOUS

## 2017-08-12 MED ORDER — KETAMINE HCL 10 MG/ML IJ SOLN
INTRAMUSCULAR | Status: DC | PRN
  Administered 2017-08-12: 20 mg via INTRAVENOUS

## 2017-08-12 MED ORDER — ACETAMINOPHEN 500 MG PO TABS: 500.0000 mg | ORAL_TABLET | Freq: Every evening | ORAL | Status: DC | PRN

## 2017-08-12 MED ORDER — CEFAZOLIN SODIUM-DEXTROSE 2-4 GM/100ML-% IV SOLN
2.0000 g | Freq: Once | INTRAVENOUS | Status: AC
  Administered 2017-08-12: 2 g via INTRAVENOUS

## 2017-08-12 MED ORDER — DIPHENHYDRAMINE HCL 25 MG PO CAPS: 25.0000 mg | ORAL_CAPSULE | Freq: Every evening | ORAL | Status: DC | PRN

## 2017-08-12 MED ORDER — ONDANSETRON HCL 4 MG/2ML IJ SOLN
INTRAMUSCULAR | Status: DC | PRN
  Administered 2017-08-12: 4 mg via INTRAVENOUS

## 2017-08-12 MED ORDER — MAGNESIUM CITRATE PO SOLN: 1.0000 | Freq: Once | ORAL | Status: DC | PRN

## 2017-08-12 MED ORDER — PROPOFOL 10 MG/ML IV BOLUS
INTRAVENOUS | Status: DC | PRN
  Administered 2017-08-12: 10 mg via INTRAVENOUS
  Administered 2017-08-12: 20 mg via INTRAVENOUS

## 2017-08-12 MED ORDER — MENTHOL 3 MG MT LOZG: 1.0000 | LOZENGE | OROMUCOSAL | Status: DC | PRN

## 2017-08-12 MED ORDER — TRANEXAMIC ACID 1000 MG/10ML IV SOLN
1000.0000 mg | INTRAVENOUS | Status: AC
  Administered 2017-08-12: 1000 mg via INTRAVENOUS
  Filled 2017-08-12: qty 1100

## 2017-08-12 MED ORDER — MIDAZOLAM HCL 2 MG/2ML IJ SOLN
INTRAMUSCULAR | Status: AC
  Filled 2017-08-12: qty 2

## 2017-08-12 MED ORDER — LORATADINE 10 MG PO TABS
10.0000 mg | ORAL_TABLET | Freq: Every day | ORAL | Status: DC
  Administered 2017-08-15: 10 mg via ORAL
  Filled 2017-08-12 (×3): qty 1

## 2017-08-12 MED ORDER — BUPIVACAINE IN DEXTROSE 0.75-8.25 % IT SOLN
INTRATHECAL | Status: DC | PRN
  Administered 2017-08-12: 10 mg via INTRATHECAL

## 2017-08-12 MED ORDER — LEVALBUTEROL HCL 0.63 MG/3ML IN NEBU
0.6300 mg | INHALATION_SOLUTION | RESPIRATORY_TRACT | Status: DC | PRN
Start: 2017-08-12 — End: 2017-08-14

## 2017-08-12 MED ORDER — CEFAZOLIN SODIUM-DEXTROSE 2-4 GM/100ML-% IV SOLN: 2.0000 g | INTRAVENOUS | Status: DC

## 2017-08-12 MED ORDER — VANCOMYCIN HCL 1000 MG IV SOLR
INTRAVENOUS | Status: DC | PRN
  Administered 2017-08-12: 1000 mg via TOPICAL

## 2017-08-12 MED ORDER — SODIUM CHLORIDE 0.9 % IV SOLN
INTRAVENOUS | Status: DC
  Administered 2017-08-12: 19:00:00 via INTRAVENOUS

## 2017-08-12 MED ORDER — FENTANYL CITRATE (PF) 100 MCG/2ML IJ SOLN
INTRAMUSCULAR | Status: AC
  Filled 2017-08-12: qty 2

## 2017-08-12 MED ORDER — SODIUM CHLORIDE 0.9 % IV SOLN
INTRAVENOUS | Status: DC | PRN
  Administered 2017-08-12: 2000 mg via TOPICAL

## 2017-08-12 MED ORDER — CEFAZOLIN SODIUM-DEXTROSE 2-4 GM/100ML-% IV SOLN
2.0000 g | Freq: Four times a day (QID) | INTRAVENOUS | Status: DC
  Administered 2017-08-12 – 2017-08-13 (×2): 2 g via INTRAVENOUS
  Filled 2017-08-12 (×3): qty 100

## 2017-08-12 MED ORDER — POVIDONE-IODINE 10 % EX SWAB: 2.0000 "application " | Freq: Once | CUTANEOUS | Status: DC

## 2017-08-12 MED ORDER — KETAMINE HCL 50 MG/5ML IJ SOSY
PREFILLED_SYRINGE | INTRAMUSCULAR | Status: AC
  Filled 2017-08-12: qty 5

## 2017-08-12 MED ORDER — POLYETHYLENE GLYCOL 3350 17 G PO PACK
17.0000 g | PACK | Freq: Every day | ORAL | Status: DC | PRN
  Administered 2017-08-12: 17 g via ORAL
  Filled 2017-08-12: qty 1

## 2017-08-12 MED ORDER — OXYCODONE-ACETAMINOPHEN 5-325 MG PO TABS: 1.0000 | ORAL_TABLET | ORAL | 0 refills | Status: DC | PRN

## 2017-08-12 MED ORDER — FENTANYL CITRATE (PF) 100 MCG/2ML IJ SOLN
INTRAMUSCULAR | Status: AC
  Administered 2017-08-12: 100 ug via INTRAVENOUS
  Filled 2017-08-12: qty 2

## 2017-08-12 MED ORDER — FENTANYL CITRATE (PF) 100 MCG/2ML IJ SOLN
50.0000 ug | Freq: Once | INTRAMUSCULAR | Status: AC
  Administered 2017-08-12: 100 ug via INTRAVENOUS

## 2017-08-12 MED ORDER — IPRATROPIUM BROMIDE 0.02 % IN SOLN: 0.5000 mg | RESPIRATORY_TRACT | Status: DC | PRN

## 2017-08-12 MED ORDER — LACTATED RINGERS IV SOLN
INTRAVENOUS | Status: DC | PRN
  Administered 2017-08-12: 11:00:00 via INTRAVENOUS

## 2017-08-12 MED ORDER — PROPOFOL 500 MG/50ML IV EMUL
INTRAVENOUS | Status: DC | PRN
  Administered 2017-08-12: 50 ug/kg/min via INTRAVENOUS

## 2017-08-12 MED ORDER — HYDROCODONE-ACETAMINOPHEN 5-325 MG PO TABS
ORAL_TABLET | ORAL | Status: AC
  Filled 2017-08-12: qty 1

## 2017-08-12 MED ORDER — ENOXAPARIN SODIUM 40 MG/0.4ML ~~LOC~~ SOLN: 40.0000 mg | Freq: Every day | SUBCUTANEOUS | 0 refills | Status: DC

## 2017-08-12 MED ORDER — HYDROCODONE-ACETAMINOPHEN 5-325 MG PO TABS
1.0000 | ORAL_TABLET | ORAL | Status: DC | PRN
  Administered 2017-08-12 – 2017-08-13 (×3): 1 via ORAL
  Filled 2017-08-12 (×2): qty 1

## 2017-08-12 MED ORDER — DOCUSATE SODIUM 100 MG PO CAPS
100.0000 mg | ORAL_CAPSULE | Freq: Two times a day (BID) | ORAL | Status: DC
  Administered 2017-08-12 – 2017-08-14 (×4): 100 mg via ORAL
  Filled 2017-08-12 (×4): qty 1

## 2017-08-12 MED ORDER — ONDANSETRON HCL 4 MG/2ML IJ SOLN
4.0000 mg | Freq: Four times a day (QID) | INTRAMUSCULAR | Status: DC | PRN
  Administered 2017-08-12 – 2017-08-13 (×2): 4 mg via INTRAVENOUS
  Filled 2017-08-12 (×4): qty 2

## 2017-08-12 MED ORDER — HYDROCODONE-ACETAMINOPHEN 7.5-325 MG PO TABS
1.0000 | ORAL_TABLET | ORAL | Status: DC | PRN
  Administered 2017-08-13 (×2): 1 via ORAL
  Administered 2017-08-15: 2 via ORAL
  Filled 2017-08-12: qty 1
  Filled 2017-08-12: qty 2
  Filled 2017-08-12: qty 1

## 2017-08-12 MED ORDER — FENTANYL CITRATE (PF) 100 MCG/2ML IJ SOLN
25.0000 ug | INTRAMUSCULAR | Status: DC | PRN
  Administered 2017-08-12 (×3): 25 ug via INTRAVENOUS

## 2017-08-12 MED ORDER — VANCOMYCIN HCL 1000 MG IV SOLR
INTRAVENOUS | Status: AC
  Filled 2017-08-12: qty 1000

## 2017-08-12 MED ORDER — SODIUM CHLORIDE 0.9 % IV SOLN: INTRAVENOUS | Status: AC

## 2017-08-12 MED ORDER — METHOCARBAMOL 1000 MG/10ML IJ SOLN
500.0000 mg | Freq: Four times a day (QID) | INTRAVENOUS | Status: DC | PRN
  Filled 2017-08-12: qty 5

## 2017-08-12 MED ORDER — IPRATROPIUM BROMIDE 0.02 % IN SOLN
0.5000 mg | Freq: Two times a day (BID) | RESPIRATORY_TRACT | Status: DC
  Administered 2017-08-13: 0.5 mg via RESPIRATORY_TRACT
  Filled 2017-08-12 (×4): qty 2.5

## 2017-08-12 MED ORDER — CEFAZOLIN SODIUM-DEXTROSE 2-4 GM/100ML-% IV SOLN
INTRAVENOUS | Status: AC
  Filled 2017-08-12: qty 100

## 2017-08-12 MED ORDER — IPRATROPIUM BROMIDE 0.02 % IN SOLN
0.5000 mg | Freq: Four times a day (QID) | RESPIRATORY_TRACT | Status: DC
  Administered 2017-08-12: 0.5 mg via RESPIRATORY_TRACT
  Filled 2017-08-12: qty 2.5

## 2017-08-12 MED ORDER — SORBITOL 70 % SOLN
30.0000 mL | Freq: Every day | Status: DC | PRN
  Filled 2017-08-12: qty 30

## 2017-08-12 MED ORDER — FENTANYL CITRATE (PF) 250 MCG/5ML IJ SOLN
INTRAMUSCULAR | Status: AC
  Filled 2017-08-12: qty 5

## 2017-08-12 MED ORDER — BUDESONIDE 0.5 MG/2ML IN SUSP
0.5000 mg | Freq: Two times a day (BID) | RESPIRATORY_TRACT | Status: DC
  Administered 2017-08-12 – 2017-08-15 (×3): 0.5 mg via RESPIRATORY_TRACT
  Filled 2017-08-12 (×5): qty 2

## 2017-08-12 MED ORDER — ONDANSETRON HCL 4 MG/2ML IJ SOLN
INTRAMUSCULAR | Status: AC
  Filled 2017-08-12: qty 2

## 2017-08-12 MED ORDER — ONDANSETRON HCL 4 MG/2ML IJ SOLN
4.0000 mg | Freq: Once | INTRAMUSCULAR | Status: AC
  Administered 2017-08-12: 4 mg via INTRAVENOUS

## 2017-08-12 SURGICAL SUPPLY — 51 items
BAG DECANTER FOR FLEXI CONT (MISCELLANEOUS) ×3 IMPLANT
CAPT HIP TOTAL 2 ×3 IMPLANT
CELLS DAT CNTRL 66122 CELL SVR (MISCELLANEOUS) IMPLANT
COVER SURGICAL LIGHT HANDLE (MISCELLANEOUS) ×3 IMPLANT
DRAPE C-ARM 42X72 X-RAY (DRAPES) ×3 IMPLANT
DRAPE POUCH INSTRU U-SHP 10X18 (DRAPES) ×3 IMPLANT
DRAPE STERI IOBAN 125X83 (DRAPES) ×3 IMPLANT
DRAPE U-SHAPE 47X51 STRL (DRAPES) ×6 IMPLANT
DRSG AQUACEL AG ADV 3.5X10 (GAUZE/BANDAGES/DRESSINGS) ×3 IMPLANT
DRSG MEPILEX BORDER 4X8 (GAUZE/BANDAGES/DRESSINGS) ×3 IMPLANT
DURAPREP 26ML APPLICATOR (WOUND CARE) ×3 IMPLANT
ELECT BLADE 4.0 EZ CLEAN MEGAD (MISCELLANEOUS) ×3
ELECT REM PT RETURN 9FT ADLT (ELECTROSURGICAL) ×3
ELECTRODE BLDE 4.0 EZ CLN MEGD (MISCELLANEOUS) ×1 IMPLANT
ELECTRODE REM PT RTRN 9FT ADLT (ELECTROSURGICAL) ×1 IMPLANT
GAUZE XEROFORM 1X8 LF (GAUZE/BANDAGES/DRESSINGS) ×3 IMPLANT
GLOVE BIOGEL PI IND STRL 7.0 (GLOVE) ×1 IMPLANT
GLOVE BIOGEL PI INDICATOR 7.0 (GLOVE) ×2
GLOVE ECLIPSE 7.0 STRL STRAW (GLOVE) ×6 IMPLANT
GLOVE SKINSENSE NS SZ7.5 (GLOVE) ×2
GLOVE SKINSENSE STRL SZ7.5 (GLOVE) ×1 IMPLANT
GLOVE SURG SYN 7.5  E (GLOVE) ×8
GLOVE SURG SYN 7.5 E (GLOVE) ×4 IMPLANT
GOWN SRG XL XLNG 56XLVL 4 (GOWN DISPOSABLE) ×1 IMPLANT
GOWN STRL NON-REIN XL XLG LVL4 (GOWN DISPOSABLE) ×2
GOWN STRL REUS W/ TWL LRG LVL3 (GOWN DISPOSABLE) IMPLANT
GOWN STRL REUS W/ TWL XL LVL3 (GOWN DISPOSABLE) ×1 IMPLANT
GOWN STRL REUS W/TWL LRG LVL3 (GOWN DISPOSABLE)
GOWN STRL REUS W/TWL XL LVL3 (GOWN DISPOSABLE) ×2
HANDPIECE INTERPULSE COAX TIP (DISPOSABLE) ×2
HOOD PEEL AWAY FLYTE STAYCOOL (MISCELLANEOUS) ×6 IMPLANT
IV NS IRRIG 3000ML ARTHROMATIC (IV SOLUTION) ×3 IMPLANT
KIT BASIN OR (CUSTOM PROCEDURE TRAY) ×3 IMPLANT
MARKER SKIN DUAL TIP RULER LAB (MISCELLANEOUS) ×3 IMPLANT
NEEDLE SPNL 18GX3.5 QUINCKE PK (NEEDLE) ×3 IMPLANT
PACK TOTAL JOINT (CUSTOM PROCEDURE TRAY) ×3 IMPLANT
PACK UNIVERSAL I (CUSTOM PROCEDURE TRAY) ×3 IMPLANT
RTRCTR WOUND ALEXIS 18CM MED (MISCELLANEOUS)
SAW OSC TIP CART 19.5X105X1.3 (SAW) ×3 IMPLANT
SET HNDPC FAN SPRY TIP SCT (DISPOSABLE) ×1 IMPLANT
STAPLER VISISTAT 35W (STAPLE) IMPLANT
SUT ETHIBOND 2 V 37 (SUTURE) ×3 IMPLANT
SUT ETHILON 3 0 FSL (SUTURE) ×6 IMPLANT
SUT VIC AB 1 CT1 27 (SUTURE) ×2
SUT VIC AB 1 CT1 27XBRD ANBCTR (SUTURE) ×1 IMPLANT
SUT VIC AB 2-0 CT1 27 (SUTURE) ×4
SUT VIC AB 2-0 CT1 TAPERPNT 27 (SUTURE) ×2 IMPLANT
SYRINGE 60CC LL (MISCELLANEOUS) ×3 IMPLANT
TOWEL OR 17X26 10 PK STRL BLUE (TOWEL DISPOSABLE) ×3 IMPLANT
TRAY CATH 16FR W/PLASTIC CATH (SET/KITS/TRAYS/PACK) IMPLANT
YANKAUER SUCT BULB TIP NO VENT (SUCTIONS) ×3 IMPLANT

## 2017-08-12 NOTE — Progress Notes (Signed)
PT Cancellation Note  Patient Details Name: Robyn Barton MRN: [id number] DOB: [birth year]   Cancelled Treatment:    Reason Eval/Treat Not Completed: Medical issues which prohibited therapy(Noted plan for surgery today, PT will evaluate pt tomorrow. )   [name] 08/12/2017, 7:45 AM [phone]

## 2017-08-12 NOTE — Progress Notes (Signed)
PROGRESS NOTE    Robyn Barton  GQQ:[id number] DOB: [birth year] DOA: 08/11/2017 PCP: Shelda Pal, DO    Brief Narrative:  Patient is a 72 year old female with history of small cell lung cancer in remission status post lobectomy, chemo/radiation who presented to the ED after a fall at home and noted to have a left hip fracture.  Patient seen in consultation by orthopedics and patient for left hip repair at [facility] 08/12/2017.   Assessment & Plan:   Principal Problem:   Hip fracture ([place]) Active Problems:   Small cell lung cancer, right upper lobe (HCC)   COPD (chronic obstructive pulmonary disease) (HCC)   Leukocytosis   Anemia due to antineoplastic chemotherapy   Depression, recurrent (HCC)   CKD (chronic kidney disease), stage III ([place])  #1 left subcapital femoral neck fracture Secondary to mechanical fall patient with no evidence of cardiopulmonary process and appears at baseline.  Chest x-ray with no acute abnormalities.  Patient with no chest pain.  EKG with no ischemic changes noted.  Patient seen in consultation by orthopedics, Dr.Xu and patient being transferred to [facility] for total hip replacement per orthopedics today.  2.  COPD Stable.  Will place on Pulmicort nebs, Xopenex and Atrovent nebs to optimize her pulmonary status.  3.  Leukocytosis Likely secondary to stress demargination secondary to problem #1.  Chest x-ray negative for any acute infiltrates.  Will check a UA with cultures and sensitivities.  Follow.  4.  Chronic kidney disease stage III Stable.  5.  Anemia H&H stable.  Follow postoperatively.  6.  Right upper lobe small cell lung cancer Status post lobectomy, chemotherapy and radiation.  In remission.  Patient with recent CT chest done on 08/01/2017 with no evidence of recurrent metastatic disease.  Outpatient follow-up with oncology.  7.  Depression Stable.  Continue Zoloft.   DVT prophylaxis: SCDs.  Postop per  orthopedics. Code Status: Full Family Communication: Updated patient.  No family at bedside. Disposition Plan: Transfer to [facility].  Likely skilled nursing facility postoperatively and per orthopedics.   Consultants:   Orthopedics: Dr. Erlinda Hong 08/12/2017  Procedures:   Chest x-ray 08/11/2017  Plain films left hip and pelvis 08/11/2017  Antimicrobials:   None   Subjective: Patient laying on gurney in ED in moderate pain in the left hip.  Denies any chest pain.  Denies any shortness of breath.  No nausea or vomiting.  Asking for some pain medication.  Also asking for Foley catheter to be placed.  Objective: Vitals:   08/12/17 0445 08/12/17 0530 08/12/17 0615 08/12/17 0700  BP: 132/79 127/77 138/73 (!) 112/58  Pulse: 88 81 82 82  Resp: 16 10 13 10   Temp:      TempSrc:      SpO2: 100% 94% 90% 92%  Weight:      Height:        Intake/Output Summary (Last 24 hours) at 08/12/2017 0919 Last data filed at 08/12/2017 0851 Gross per 24 hour  Intake 250 ml  Output -  Net 250 ml   Filed Weights   08/11/17 1943  Weight: 50.8 kg (112 lb)    Examination:  General exam: In moderate pain. Respiratory system: Clear to auscultation bilaterally anterior lung fields.  No wheezes, no crackles, no rhonchi.  Respiratory effort normal. Cardiovascular system: S1 & S2 heard, RRR. No JVD, murmurs, rubs, gallops or clicks. No pedal edema. Gastrointestinal system: Abdomen is nondistended, soft and nontender. No organomegaly or masses  felt. Normal bowel sounds heard. Central nervous system: Alert and oriented. No focal neurological deficits. Extremities: Left hip externally rotated and shortened.  Tender to palpation on the left hip.  Symmetric 5 x 5 power. Skin: No rashes, lesions or ulcers Psychiatry: Judgement and insight appear normal. Mood & affect appropriate.     Data Reviewed: I have personally reviewed following labs and imaging studies  CBC: Recent Labs  Lab 08/11/17 2128  08/12/17 0607  WBC 10.9* 7.4  NEUTROABS 9.5*  --   HGB 11.6* 11.3*  HCT 35.1* 34.2*  MCV 88.4 89.5  PLT 227 423   Basic Metabolic Panel: Recent Labs  Lab 08/11/17 2128 08/12/17 0607  NA 136 138  K 3.7 4.4  CL 101 103  CO2 26 29  GLUCOSE 118* 109*  BUN 34* 29*  CREATININE 1.28* 1.20*  CALCIUM 9.7 9.6   GFR: Estimated Creatinine Clearance: 33 mL/min (A) (by C-G formula based on SCr of 1.2 mg/dL (H)). Liver Function Tests: Recent Labs  Lab 08/11/17 2128  AST 19  ALT 16  ALKPHOS 60  BILITOT 0.7  PROT 7.7  ALBUMIN 4.0   No results for input(s): LIPASE, AMYLASE in the last 168 hours. No results for input(s): AMMONIA in the last 168 hours. Coagulation Profile: Recent Labs  Lab 08/11/17 2128  INR 0.95   Cardiac Enzymes: No results for input(s): CKTOTAL, CKMB, CKMBINDEX, TROPONINI in the last 168 hours. BNP (last 3 results) No results for input(s): PROBNP in the last 8760 hours. HbA1C: No results for input(s): HGBA1C in the last 72 hours. CBG: No results for input(s): GLUCAP in the last 168 hours. Lipid Profile: No results for input(s): CHOL, HDL, LDLCALC, TRIG, CHOLHDL, LDLDIRECT in the last 72 hours. Thyroid Function Tests: No results for input(s): TSH, T4TOTAL, FREET4, T3FREE, THYROIDAB in the last 72 hours. Anemia Panel: No results for input(s): VITAMINB12, FOLATE, FERRITIN, TIBC, IRON, RETICCTPCT in the last 72 hours. Sepsis Labs: No results for input(s): PROCALCITON, LATICACIDVEN in the last 168 hours.  No results found for this or any previous visit (from the past 240 hour(s)).       Radiology Studies: Dg Chest Port 1 View  Result Date: 08/11/2017 CLINICAL DATA:  Preop, hip fracture EXAM: PORTABLE CHEST 1 VIEW COMPARISON:  CT chest dated 08/01/2017 FINDINGS: Postsurgical changes related to right middle lobectomy and radiation changes in the right hemithorax. Left lung is clear. No pleural effusion or pneumothorax. Rightward cardiomediastinal shift.  IMPRESSION: Postsurgical and post radiation changes in the right hemithorax, better evaluated on recent CT. No evidence of acute cardiopulmonary disease. Electronically Signed   By: Julian Hy M.D.   On: 08/11/2017 21:32   Dg Hip Unilat With Pelvis 2-3 Views Left  Result Date: 08/11/2017 CLINICAL DATA:  [place].  Pain. EXAM: DG HIP (WITH OR WITHOUT PELVIS) 2-3V LEFT COMPARISON:  None. FINDINGS: There is an acute transverse displaced subcapital LEFT femoral neck fracture, with foreshortening. Pelvis is intact. Skeletal osteopenia is suspected. IMPRESSION: Acute LEFT subcapital femoral neck fracture with foreshortening. Electronically Signed   By: Staci Righter M.D.   On: 08/11/2017 20:53        Scheduled Meds: . atorvastatin  40 mg Oral QHS  . budesonide (PULMICORT) nebulizer solution  0.5 mg Nebulization BID  . cholecalciferol  2,000 Units Oral Daily  . ipratropium  0.5 mg Nebulization Q6H  . levalbuterol  0.63 mg Nebulization Q6H  . loratadine  10 mg Oral Daily  . pantoprazole  40 mg Oral  Daily  . polyethylene glycol  17 g Oral Daily  . sertraline  100 mg Oral Daily   Continuous Infusions: . sodium chloride       LOS: 1 day    Time spent: 35 minutes    Irine Seal, MD [facility] Pager [phone]  If 7PM-7AM, please contact night-coverage [email or web address] Password [facility] 08/12/2017, 9:19 AM

## 2017-08-12 NOTE — Anesthesia Preprocedure Evaluation (Addendum)
Anesthesia Evaluation  Patient identified by MRN, date of birth, ID band Patient awake    Reviewed: Allergy & Precautions, NPO status , Patient's Chart, lab work & pertinent test results  Airway Mallampati: II  TM Distance: >3 FB Neck ROM: Full    Dental  (+) Dental Advisory Given   Pulmonary sleep apnea , COPD,  COPD inhaler, former smoker,  S/p VATS and right middle lobectomy for Lung CA   breath sounds clear to auscultation       Cardiovascular negative cardio ROS   Rhythm:Regular Rate:Normal  Echo 11/18 Study Conclusions  - Left ventricle: The cavity size was normal. There was mild   concentric hypertrophy. Systolic function was normal. The   estimated ejection fraction was in the range of 60% to 65%. Wall   motion was normal; there were no regional wall motion   abnormalities. Left ventricular diastolic function parameters   were normal. - Atrial septum: No defect or patent foramen ovale was identified. - Impressions: No strain imaging done.   Neuro/Psych PSYCHIATRIC DISORDERS Anxiety Depression    GI/Hepatic hiatal hernia, GERD  Medicated,  Endo/Other    Renal/GU Renal disease     Musculoskeletal   Abdominal   Peds  Hematology   Anesthesia Other Findings   Reproductive/Obstetrics                            Anesthesia Physical  Anesthesia Plan  ASA: III  Anesthesia Plan: Spinal   Post-op Pain Management:    Induction: Intravenous  PONV Risk Score and Plan: 3 and Ondansetron and Treatment may vary due to age or medical condition  Airway Management Planned: Nasal Cannula, Natural Airway and Mask  Additional Equipment:   Intra-op Plan:   Post-operative Plan: Extubation in OR  Informed Consent: I have reviewed the patients History and Physical, chart, labs and discussed the procedure including the risks, benefits and alternatives for the proposed anesthesia with the  patient or authorized representative who has indicated his/her understanding and acceptance.   Dental advisory given  Plan Discussed with: CRNA  Anesthesia Plan Comments:         Anesthesia Quick Evaluation

## 2017-08-12 NOTE — Anesthesia Procedure Notes (Signed)
Spinal  Patient location during procedure: OR Start time: 08/12/2017 11:30 AM End time: 08/12/2017 11:39 AM Staffing Anesthesiologist: Janeece Riggers, MD Preanesthetic Checklist Completed: patient identified, site marked, surgical consent, pre-op evaluation, timeout performed, IV checked, risks and benefits discussed and monitors and equipment checked Spinal Block Patient position: sitting Prep: DuraPrep Patient monitoring: heart rate, cardiac monitor, continuous pulse ox and blood pressure Approach: midline Location: L2-3 Injection technique: single-shot Needle Needle type: Sprotte  Needle gauge: 22 G Needle length: 9 cm Assessment Sensory level: T4 Additional Notes Multi attempts with 24 Sprotte, change to 22g higher level with out difficulty + CSF/ DOSE / +CSF ASP

## 2017-08-12 NOTE — Discharge Instructions (Signed)

## 2017-08-12 NOTE — Anesthesia Procedure Notes (Signed)
Procedure Name: MAC Date/Time: 08/12/2017 11:29 AM Performed by: Colin Benton, CRNA Pre-anesthesia Checklist: Patient identified, Emergency Drugs available, Suction available and Patient being monitored Patient Re-evaluated:Patient Re-evaluated prior to induction Oxygen Delivery Method: Nasal cannula Induction Type: IV induction Placement Confirmation: positive ETCO2

## 2017-08-12 NOTE — ED Notes (Signed)
Offered pt to use a hospital bed due to boarding over night. Pt refused and does not want to move out of her stretcher.

## 2017-08-12 NOTE — Progress Notes (Signed)
OT Cancellation Note  Patient Details Name: Robyn Barton MRN: [id number] DOB: 1945/02/01   Cancelled Treatment:    Reason Eval/Treat Not Completed: Other (comment);Medical issues which prohibited therapy.  Please reorder OT after sx. Thank you.  [name] 08/12/2017, 7:08 AM  [name], OTR/L [phone] 08/12/2017

## 2017-08-12 NOTE — Consult Note (Signed)
ORTHOPAEDIC CONSULTATION  REQUESTING PHYSICIAN: Eugenie Filler, MD  Chief Complaint: Left femoral neck hip fracture  HPI: Robyn Barton is a 72 y.o. female who presents with left hip fracture s/p mechanical fall PTA at home.  The patient endorses severe pain in the left hip, that does not radiate, grinding in quality, worse with any movement, better with immobilization.  Denies LOC/fever/chills/nausea/vomiting.  Walks with assistive devices (walker, cane, wheelchair) for long distances.  Does live independently alone.  Denies LOC, neck pain, abd pain.  Denies any preceding hip pain prior to fall.  Currently under treatment for lung cancer.  Past Medical History:  Diagnosis Date  . Allergic rhinitis   . Anxiety   . Elevated TSH    pt denies  . Emphysema lung ([place])   . Enlarged lymph nodes    in chest  . GERD (gastroesophageal reflux disease)   . Goals of care, counseling/discussion 10/21/2016  . History of hiatal hernia   . History of radiation therapy 03/10/17- 03/21/17   Whole brain, prophylactic, radiation 25 Gy in 10 fractions  . History of radiation therapy 11/18/16- 01/16/17   59.4 Gy to lung in 33 fractions.  . Hyperlipidemia   . IFG (impaired fasting glucose)   . Osteopenia   . Recurrent major depressive disorder ([place])   . Sleep apnea    mild no cpap needed  . Small cell carcinoma of right lung ([place]) 2018   surgery right middle lobe removed   . Tobacco dependence   . Varicose veins of both lower extremities   . Vitamin D deficiency    Past Surgical History:  Procedure Laterality Date  . ABDOMINAL HYSTERECTOMY     1 ovary left  . ENDOBRONCHIAL ULTRASOUND Bilateral 10/15/2016   Procedure: ENDOBRONCHIAL ULTRASOUND;  Surgeon: Collene Gobble, MD;  Location: [facility]COPY;  Service: Cardiopulmonary;  Laterality: Bilateral;  . INCONTINENCE SURGERY     bladder sling  . right middle lobe lung surgery  06/26/2016   [facility]   Social History   Socioeconomic History  .  Marital status: Divorced    Spouse name: Not on file  . Number of children: Not on file  . Years of education: Not on file  . Highest education level: Not on file  Occupational History  . Not on file  Social Needs  . Financial resource strain: Not on file  . Food insecurity:    Worry: Not on file    Inability: Not on file  . Transportation needs:    Medical: Not on file    Non-medical: Not on file  Tobacco Use  . Smoking status: Former Smoker    Packs/day: 1.50    Years: 50.00    Pack years: 75.00    Types: Cigarettes    Last attempt to quit: 06/14/2016    Years since quitting: 1.1  . Smokeless tobacco: Never Used  Substance and Sexual Activity  . Alcohol use: Yes    Comment: rare  . Drug use: No  . Sexual activity: Never  Lifestyle  . Physical activity:    Days per week: Not on file    Minutes per session: Not on file  . Stress: Not on file  Relationships  . Social connections:    Talks on phone: Not on file    Gets together: Not on file    Attends religious service: Not on file    Active member of club or organization: Not on file    Attends  meetings of clubs or organizations: Not on file    Relationship status: Not on file  Other Topics Concern  . Not on file  Social History Narrative  . Not on file   Family History  Problem Relation Age of Onset  . Depression Mother   . Cancer Father   . COPD Father   . Heart disease Father   . High blood pressure Father   . High Cholesterol Father    No Known Allergies Prior to Admission medications   Medication Sig Start Date End Date Taking? Authorizing Provider  albuterol (PROVENTIL HFA;VENTOLIN HFA) 108 (90 Base) MCG/ACT inhaler Inhale 2 puffs into the lungs every 6 (six) hours as needed for wheezing or shortness of breath. 04/23/17  Yes Shelda Pal, DO  alendronate (FOSAMAX) 70 MG tablet Take 70 mg by mouth every Monday. Take with a full glass of water on an empty stomach.    Yes [provider]    atorvastatin (LIPITOR) 40 MG tablet Take 40 mg by mouth at bedtime.    Yes [provider]  Cholecalciferol (VITAMIN D) 2000 units tablet Take 2,000 Units by mouth daily.   Yes [provider]  fluticasone furoate-vilanterol (BREO ELLIPTA) 100-25 MCG/INH AEPB Inhale 1 puff into the lungs daily. 05/12/17  Yes Wendling, Crosby Oyster, DO  guaiFENesin (MUCINEX) 600 MG 12 hr tablet Take 600 mg by mouth 2 (two) times daily as needed for cough or to loosen phlegm.   Yes [provider]  omeprazole (PRILOSEC) 40 MG capsule Take 40 mg by mouth daily.  08/01/16  Yes [provider]  ondansetron (ZOFRAN) 8 MG tablet TAKE 1 TABLET (8 MG TOTAL) BY MOUTH EVERY 8 (EIGHT) HOURS AS NEEDED FOR UP TO 7 DAYS FOR NAUSEA. 07/24/17  Yes [provider]  polyethylene glycol (MIRALAX / GLYCOLAX) packet Take 17 g by mouth daily.   Yes [provider]  sertraline (ZOLOFT) 100 MG tablet Take 1 tablet (100 mg total) by mouth daily. 02/20/17  Yes Shelda Pal, DO  tiotropium (SPIRIVA HANDIHALER) 18 MCG inhalation capsule Place 1 capsule (18 mcg total) into inhaler and inhale daily. 04/23/17  Yes Shelda Pal, DO  calcium carbonate (TUMS - DOSED IN MG ELEMENTAL CALCIUM) 500 MG chewable tablet Chew 1 tablet daily as needed by mouth for indigestion or heartburn.    [provider]  diphenhydramine-acetaminophen (TYLENOL PM) 25-500 MG TABS tablet Take 1 tablet at bedtime as needed by mouth (sleep).    [provider]  loperamide (IMODIUM) 2 MG capsule Take 2 mg as needed by mouth for diarrhea or loose stools.    [provider]  loratadine (CLARITIN) 10 MG tablet Take 10 mg by mouth daily as needed (hay fever).    [provider]   Dg Chest Port 1 View  Result Date: 08/11/2017 CLINICAL DATA:  Preop, hip fracture EXAM: PORTABLE CHEST 1 VIEW COMPARISON:  CT chest dated 08/01/2017 FINDINGS: Postsurgical changes related to right  middle lobectomy and radiation changes in the right hemithorax. Left lung is clear. No pleural effusion or pneumothorax. Rightward cardiomediastinal shift. IMPRESSION: Postsurgical and post radiation changes in the right hemithorax, better evaluated on recent CT. No evidence of acute cardiopulmonary disease. Electronically Signed   By: Julian Hy M.D.   On: 08/11/2017 21:32   Dg Hip Unilat With Pelvis 2-3 Views Left  Result Date: 08/11/2017 CLINICAL DATA:  [place].  Pain. EXAM: DG HIP (WITH OR WITHOUT PELVIS) 2-3V LEFT COMPARISON:  None. FINDINGS: There is an acute transverse displaced subcapital LEFT femoral neck fracture, with foreshortening. Pelvis is intact. Skeletal osteopenia is suspected. IMPRESSION: Acute LEFT subcapital femoral neck fracture with foreshortening. Electronically Signed   By: Staci Righter M.D.   On: 08/11/2017 20:53    All pertinent xrays, MRI, CT independently reviewed and interpreted  Positive ROS: All other systems have been reviewed and were otherwise negative with the exception of those mentioned in the HPI and as above.  Physical Exam: General: Alert, no acute distress Cardiovascular: No pedal edema Respiratory: No cyanosis, no use of accessory musculature GI: No organomegaly, abdomen is soft and non-tender Skin: No lesions in the area of chief complaint Neurologic: Sensation intact distally, neuropathy from chemo Psychiatric: Patient is competent for consent with normal mood and affect Lymphatic: No axillary or cervical lymphadenopathy  MUSCULOSKELETAL:  - pain with movement of the hip and extremity - skin intact - NVI distally - compartments soft  Assessment: Left femoral neck fracture  Plan: - total hip replacement is recommended, patient aware of r/b/a and wish to proceed - consent obtained - medical optimization per primary team - surgery is planned for today - continue NPO - pending transfer to [facility] - insert foley  Thank you for the  consult and the opportunity to see Robyn Barton  N. Eduard Roux, MD [place] 7:14 AM

## 2017-08-12 NOTE — Transfer of Care (Signed)
Immediate Anesthesia Transfer of Care Note  Patient: Robyn Barton  Procedure(s) Performed: ANTERIOR APPROACH HEMI HIP ARTHROPLASTY (Left Hip)  Patient Location: PACU  Anesthesia Type:MAC and Spinal  Level of Consciousness: awake, alert , oriented and patient cooperative  Airway & Oxygen Therapy: Patient Spontanous Breathing and Patient connected to nasal cannula oxygen  Post-op Assessment: Report given to RN and Post -op Vital signs reviewed and stable  Post vital signs: Reviewed and stable  Last Vitals:  Vitals Value Taken Time  BP 106/60 08/12/2017  1:19 PM  Temp    Pulse 97 08/12/2017  1:21 PM  Resp 12 08/12/2017  1:21 PM  SpO2 100 % 08/12/2017  1:21 PM  Vitals shown include unvalidated device data.  Last Pain:  Vitals:   08/12/17 1015  TempSrc:   PainSc: 5          Complications: No apparent anesthesia complications

## 2017-08-12 NOTE — Anesthesia Postprocedure Evaluation (Signed)
Anesthesia Post Note  Patient: [name]  Procedure(s) Performed: ANTERIOR APPROACH HEMI HIP ARTHROPLASTY (Left Hip)     Patient location during evaluation: PACU Anesthesia Type: Spinal Level of consciousness: oriented and awake and alert Pain management: pain level controlled Vital Signs Assessment: post-procedure vital signs reviewed and stable Respiratory status: spontaneous breathing, respiratory function stable and patient connected to nasal cannula oxygen Cardiovascular status: blood pressure returned to baseline and stable Postop Assessment: no headache, no backache and no apparent nausea or vomiting Anesthetic complications: no    Last Vitals:  Vitals:   08/12/17 0615 08/12/17 0700  BP: 138/73 (!) 112/58  Pulse: 82 82  Resp: 13 10  Temp:    SpO2: 90% 92%    Last Pain:  Vitals:   08/12/17 1015  TempSrc:   PainSc: 5                  Sakira Dahmer

## 2017-08-12 NOTE — ED Notes (Signed)
Pt was offered pain medicine at this time. Pt refused.

## 2017-08-12 NOTE — Op Note (Signed)
ANTERIOR APPROACH HEMI HIP ARTHROPLASTY  Procedure Note Robyn Barton   [id number]  Pre-op Diagnosis: left hip fracture     Post-op Diagnosis: same   Operative Procedures  1. Total hip replacement; Left hip; uncemented [id number]   Personnel  Surgeon(s): Leandrew Koyanagi, MD  Assist: Madalyn Rob, PA-C; necessary for the timely completion of procedure and due to complexity of procedure.   Anesthesia: spinal  Prosthesis: Depuy Acetabulum: Pinnacle 52 mm Femur: Corail KA 12 Head: 36 mm size: +8.5 Liner: +0 neutral Bearing Type: metal on poly  Total Hip Arthroplasty (Anterior Approach) Op Note:  After informed consent was obtained and the operative extremity marked in the holding area, the patient was brought back to the operating room and placed supine on the HANA table. Next, the operative extremity was prepped and draped in normal sterile fashion. Surgical timeout occurred verifying patient identification, surgical site, surgical procedure and administration of antibiotics.  A modified anterior Smith-Peterson approach to the hip was performed, using the interval between tensor fascia lata and sartorius.  Dissection was carried bluntly down onto the anterior hip capsule. The lateral femoral circumflex vessels were identified and coagulated. A capsulotomy was performed and the capsular flaps tagged for later repair.  Fluoroscopy was utilized to prepare for the femoral neck cut. The neck osteotomy was performed. The femoral head was removed, the acetabular rim was cleared of soft tissue and attention was turned to reaming the acetabulum.  Sequential reaming was performed under fluoroscopic guidance. We reamed to a size 51 mm, and then impacted the acetabular shell. The liner was then placed after irrigation and attention turned to the femur.  After placing the femoral hook, the leg was taken to externally rotated, extended and adducted position taking care to perform soft tissue  releases to allow for adequate mobilization of the femur. Soft tissue was cleared from the shoulder of the greater trochanter and the hook elevator used to improve exposure of the proximal femur. Sequential broaching performed up to a size 12. Trial neck and head were placed. The leg was brought back up to neutral and the construct reduced. The position and sizing of components, offset and leg lengths were checked using fluoroscopy. Stability of the  construct was checked in extension and external rotation without any subluxation or impingement of prosthesis. We dislocated the prosthesis, dropped the leg back into position, removed trial components, and irrigated copiously. The final stem and head was then placed, the leg brought back up, the system reduced and fluoroscopy used to verify positioning.  We irrigated, obtained hemostasis and closed the capsule using #2 ethibond suture.  One gram of vancomycin powder was placed in the surgical bed. The fascia was closed with #1 vicryl plus, the deep fat layer was closed with 0 vicryl, the subcutaneous layers closed with 2.0 Vicryl Plus and the skin closed with 2.0 nylon and steri strips. A sterile dressing was applied. The patient was awakened in the operating room and taken to recovery in stable condition.  All sponge, needle, and instrument counts were correct at the end of the case.   Position: supine  Complications: none.  Time Out: performed   Drains/Packing: none  Estimated blood loss: 150 cc  Returned to Recovery Room: in good condition.   Antibiotics: yes   Mechanical VTE (DVT) Prophylaxis: sequential compression devices, TED thigh-high  Chemical VTE (DVT) Prophylaxis: lovenox   Fluid Replacement: see anesthesia record  Specimens Removed: 1 to pathology   Sponge and  Instrument Count Correct? yes   PACU: portable radiograph - low AP   Admission: inpatient status  Plan/RTC: Return in 2 weeks for staple removal. Weight Bearing/Load  Lower Extremity: full  Hip precautions: none Suture Removal: 10-14 days  Betadine to incision twice daily once dressing is removed on POD#7  N. Eduard Roux, MD [facility] [phone] 12:51 PM     Implant Name Type Inv. Item Serial No. Manufacturer Lot No. LRB No. Used  PIN SECTOR W/GRIP ACE CUP 52MM - [id number] Hips PIN SECTOR W/GRIP ACE CUP 52MM  DEPUY SYNTHES [id number] Left 1  LINER ACETAB NEUTRAL 36ID 520D - [id number] Liner LINER ACETAB NEUTRAL 36ID 520D  DEPUY SYNTHES [id number] Left 1  SCREW 6.5MMX25MM - [id number] Screw SCREW 6.5MMX25MM  DEPUY SYNTHES [id number] Left 1  STEM CORAIL KA12 - [id number] Stem STEM CORAIL KA12  DEPUY SYNTHES [id number] Left 1  HIP BALL ARTICU EZE 36 8.5 - [id number] Hips HIP BALL ARTICU EZE 36 8.5  DEPUY SYNTHES [id number] Left 1

## 2017-08-13 ENCOUNTER — Encounter (HOSPITAL_COMMUNITY): Payer: Self-pay | Admitting: Orthopaedic Surgery

## 2017-08-13 DIAGNOSIS — S72002P Fracture of unspecified part of neck of left femur, subsequent encounter for closed fracture with malunion: Secondary | ICD-10-CM

## 2017-08-13 LAB — URINALYSIS, ROUTINE W REFLEX MICROSCOPIC
BILIRUBIN URINE: NEGATIVE
GLUCOSE, UA: NEGATIVE mg/dL
Ketones, ur: NEGATIVE mg/dL
Nitrite: NEGATIVE
PH: 5 (ref 5.0–8.0)
Protein, ur: NEGATIVE mg/dL
SPECIFIC GRAVITY, URINE: 1.015 (ref 1.005–1.030)
WBC, UA: >50 WBC/hpf — ABNORMAL HIGH (ref 0–5)

## 2017-08-13 LAB — BASIC METABOLIC PANEL
ANION GAP: 7 (ref 5–15)
BUN: 18 mg/dL (ref 8–23)
CHLORIDE: 105 mmol/L (ref 98–111)
CO2: 26 mmol/L (ref 22–32)
Calcium: 8.9 mg/dL (ref 8.9–10.3)
Creatinine, Ser: 1.12 mg/dL — ABNORMAL HIGH (ref 0.44–1.00)
GFR calc Af Amer: 55 mL/min — ABNORMAL LOW (ref >60)
GFR, EST NON AFRICAN AMERICAN: 47 mL/min — AB (ref >60)
GLUCOSE: 89 mg/dL (ref 70–99)
POTASSIUM: 3.8 mmol/L (ref 3.5–5.1)
Sodium: 138 mmol/L (ref 135–145)

## 2017-08-13 LAB — CBC
HEMATOCRIT: 25.5 % — AB (ref 36.0–46.0)
Hemoglobin: 8.1 g/dL — ABNORMAL LOW (ref 12.0–15.0)
MCH: 29.5 pg (ref 26.0–34.0)
MCHC: 31.8 g/dL (ref 30.0–36.0)
MCV: 92.7 fL (ref 78.0–100.0)
PLATELETS: 129 10*3/uL — AB (ref 150–400)
RBC: 2.75 MIL/uL — AB (ref 3.87–5.11)
RDW: 15.6 % — ABNORMAL HIGH (ref 11.5–15.5)
WBC: 6.1 10*3/uL (ref 4.0–10.5)

## 2017-08-13 MED ORDER — METOCLOPRAMIDE HCL 5 MG PO TABS
5.0000 mg | ORAL_TABLET | Freq: Three times a day (TID) | ORAL | Status: DC
  Administered 2017-08-14: 5 mg via ORAL
  Filled 2017-08-13 (×3): qty 1

## 2017-08-13 MED ORDER — SODIUM CHLORIDE 0.9 % IV SOLN: INTRAVENOUS | Status: DC

## 2017-08-13 NOTE — Progress Notes (Signed)
PROGRESS NOTE    Robyn Barton  SEG:[id number] DOB: 01/31/1945 DOA: 08/11/2017 PCP: Shelda Pal, DO    Brief Narrative:  Patient is a 72 year old female with history of small cell lung cancer in remission status post lobectomy, chemo/radiation who presented to the ED after a fall at home and noted to have a left hip fracture.  Patient seen in consultation by orthopedics and patient for left hip repair at [facility] 08/12/2017.   Assessment & Plan:   Principal Problem:   Hip fracture ([facility] [place]) Active Problems:   Small cell lung cancer, right upper lobe (HCC)   COPD (chronic obstructive pulmonary disease) (HCC)   Leukocytosis   Anemia due to antineoplastic chemotherapy   Depression, recurrent (HCC)   CKD (chronic kidney disease), stage III (HCC)  #1 left subcapital femoral neck fracture status post surgery 08/12/2017 Secondary to mechanical fall patient  .  Chest x-ray with no acute abnormalities.  Patient with no chest pain.  EKG with no ischemic changes noted.  Continue expected postop care management of pain, DVT prophylaxis, weightbearing status etc. to orthopedics If hemoglobin drops below 7 would transfuse 1 unit of blood--- labs in a.m.  2.  COPD Pulmicort nebs, Xopenex and Atrovent nebs to optimize her pulmonary status.\ Continue postop incentive spirometer  3.  Leukocytosis Likely secondary to stress demargination secondary to problem #1.  Chest x-ray negative for any acute infiltrates.  Will check a UA with cultures and sensitivities.  Follow.  4.  Chronic kidney disease stage III Stable.  5.  Anemia H&H stable.  Follow postoperatively.  6.  Right upper lobe small cell lung cancer Status post lobectomy, chemotherapy and radiation.  In remission.  Patient with recent CT chest done on 08/01/2017 with no evidence of recurrent metastatic disease.  Outpatient follow-up with oncology.  7.  Depression Stable.  Continue Zoloft.   DVT prophylaxis: SCDs.   Postop per orthopedics. Code Status: Full Family Communication: Updated patient.  No family at bedside. Disposition Plan:  Likely skilled nursing facility postoperatively and per orthopedics.   Consultants:   Orthopedics: Dr. Erlinda Hong 08/12/2017  Procedures:   Chest x-ray 08/11/2017  Plain films left hip and pelvis 08/11/2017  Antimicrobials:   None   Subjective: Patient laying on gurney in ED in moderate pain in the left hip.  Denies any chest pain.  Denies any shortness of breath.  No nausea or vomiting.  Asking for some pain medication.  Also asking for Foley catheter to be placed.  Objective: Vitals:   08/13/17 0505 08/13/17 0807 08/13/17 0808 08/13/17 0936  BP: (!) 106/56   (!) 118/55  Pulse: 91   98  Resp: 16   15  Temp: 98.5 F (36.9 C)   98.6 F (37 C)  TempSrc: Oral   Oral  SpO2: 91% 91% 91% 94%  Weight:      Height:        Intake/Output Summary (Last 24 hours) at 08/13/2017 1054 Last data filed at 08/13/2017 0919 Gross per 24 hour  Intake 2359.66 ml  Output 2275 ml  Net 84.66 ml   Filed Weights   08/11/17 1943  Weight: 50.8 kg (112 lb)    Examination:  General exam: In moderate pain. Respiratory system: Clear to auscultation bilaterally anterior lung fields.  No wheezes, no crackles, no rhonchi.  Respiratory effort normal. Cardiovascular system: S1 & S2 heard, RRR. No JVD, murmurs, rubs, gallops or clicks. No pedal edema. Gastrointestinal system: Abdomen is nondistended, soft and  nontender. No organomegaly or masses felt. Normal bowel sounds heard. Central nervous system: Alert and oriented. No focal neurological deficits. Extremities: Left hip externally rotated and shortened.  Tender to palpation on the left hip.  Symmetric 5 x 5 power. Skin: No rashes, lesions or ulcers Psychiatry: Judgement and insight appear normal. Mood & affect appropriate.     Data Reviewed: I have personally reviewed following labs and imaging studies  CBC: Recent Labs  Lab  08/11/17 2128 08/12/17 0607 08/12/17 1950 08/13/17 0557  WBC 10.9* 7.4 9.7 6.1  NEUTROABS 9.5*  --   --   --   HGB 11.6* 11.3* 8.8* 8.1*  HCT 35.1* 34.2* 27.5* 25.5*  MCV 88.4 89.5 92.9 92.7  PLT 227 209 147* 621*   Basic Metabolic Panel: Recent Labs  Lab 08/11/17 2128 08/12/17 0607 08/12/17 1950 08/13/17 0557  NA 136 138  --  138  K 3.7 4.4  --  3.8  CL 101 103  --  105  CO2 26 29  --  26  GLUCOSE 118* 109*  --  89  BUN 34* 29*  --  18  CREATININE 1.28* 1.20* 1.21* 1.12*  CALCIUM 9.7 9.6  --  8.9   GFR: Estimated Creatinine Clearance: 35.3 mL/min (A) (by C-G formula based on SCr of 1.12 mg/dL (H)). Liver Function Tests: Recent Labs  Lab 08/11/17 2128  AST 19  ALT 16  ALKPHOS 60  BILITOT 0.7  PROT 7.7  ALBUMIN 4.0   No results for input(s): LIPASE, AMYLASE in the last 168 hours. No results for input(s): AMMONIA in the last 168 hours. Coagulation Profile: Recent Labs  Lab 08/11/17 2128  INR 0.95   Cardiac Enzymes: No results for input(s): CKTOTAL, CKMB, CKMBINDEX, TROPONINI in the last 168 hours. BNP (last 3 results) No results for input(s): PROBNP in the last 8760 hours. HbA1C: No results for input(s): HGBA1C in the last 72 hours. CBG: No results for input(s): GLUCAP in the last 168 hours. Lipid Profile: No results for input(s): CHOL, HDL, LDLCALC, TRIG, CHOLHDL, LDLDIRECT in the last 72 hours. Thyroid Function Tests: No results for input(s): TSH, T4TOTAL, FREET4, T3FREE, THYROIDAB in the last 72 hours. Anemia Panel: No results for input(s): VITAMINB12, FOLATE, FERRITIN, TIBC, IRON, RETICCTPCT in the last 72 hours. Sepsis Labs: No results for input(s): PROCALCITON, LATICACIDVEN in the last 168 hours.  No results found for this or any previous visit (from the past 240 hour(s)).       Radiology Studies: Pelvis Portable  Result Date: 08/12/2017 CLINICAL DATA:  Status post left hip replacement. EXAM: PORTABLE PELVIS 1-2 VIEWS COMPARISON:   Fluoroscopic images of same day. FINDINGS: The left femoral and acetabular components appear to be well situated. No fracture or dislocation is noted. Expected postoperative changes are seen in the surrounding soft tissues. IMPRESSION: Status post left total hip arthroplasty. Electronically Signed   By: Marijo Conception, M.D.   On: 08/12/2017 14:11   Dg Chest Port 1 View  Result Date: 08/11/2017 CLINICAL DATA:  Preop, hip fracture EXAM: PORTABLE CHEST 1 VIEW COMPARISON:  CT chest dated 08/01/2017 FINDINGS: Postsurgical changes related to right middle lobectomy and radiation changes in the right hemithorax. Left lung is clear. No pleural effusion or pneumothorax. Rightward cardiomediastinal shift. IMPRESSION: Postsurgical and post radiation changes in the right hemithorax, better evaluated on recent CT. No evidence of acute cardiopulmonary disease. Electronically Signed   By: Julian Hy M.D.   On: 08/11/2017 21:32   Dg C-arm 1-60  Min  Result Date: 08/12/2017 CLINICAL DATA:  Left hip arthroplasty EXAM: OPERATIVE LEFT HIP WITH PELVIS; DG C-ARM 61-120 MIN COMPARISON:  None. FLUOROSCOPY TIME:  Radiation Exposure Index (as provided by the fluoroscopic device): Not available If the device does not provide the exposure index: Fluoroscopy Time:  48 seconds Number of Acquired Images:  2 FINDINGS: Initial image demonstrates degenerative changes of the left hip joint. Left femoral fracture is noted as well. Subsequent left hip replacement is noted in satisfactory position. IMPRESSION: Status post left hip replacement Electronically Signed   By: Inez Catalina M.D.   On: 08/12/2017 12:58   Dg Hip Operative Unilat W Or W/o Pelvis Left  Result Date: 08/12/2017 CLINICAL DATA:  Left hip arthroplasty EXAM: OPERATIVE LEFT HIP WITH PELVIS; DG C-ARM 61-120 MIN COMPARISON:  None. FLUOROSCOPY TIME:  Radiation Exposure Index (as provided by the fluoroscopic device): Not available If the device does not provide the exposure  index: Fluoroscopy Time:  48 seconds Number of Acquired Images:  2 FINDINGS: Initial image demonstrates degenerative changes of the left hip joint. Left femoral fracture is noted as well. Subsequent left hip replacement is noted in satisfactory position. IMPRESSION: Status post left hip replacement Electronically Signed   By: Inez Catalina M.D.   On: 08/12/2017 12:58   Dg Hip Unilat With Pelvis 2-3 Views Left  Result Date: 08/11/2017 CLINICAL DATA:  [place].  Pain. EXAM: DG HIP (WITH OR WITHOUT PELVIS) 2-3V LEFT COMPARISON:  None. FINDINGS: There is an acute transverse displaced subcapital LEFT femoral neck fracture, with foreshortening. Pelvis is intact. Skeletal osteopenia is suspected. IMPRESSION: Acute LEFT subcapital femoral neck fracture with foreshortening. Electronically Signed   By: Staci Righter M.D.   On: 08/11/2017 20:53        Scheduled Meds: . acetaminophen  500 mg Oral Q6H  . atorvastatin  40 mg Oral QHS  . budesonide (PULMICORT) nebulizer solution  0.5 mg Nebulization BID  . cholecalciferol  2,000 Units Oral Daily  . docusate sodium  100 mg Oral BID  . enoxaparin (LOVENOX) injection  40 mg Subcutaneous Q24H  . ipratropium  0.5 mg Nebulization BID  . levalbuterol  0.63 mg Nebulization BID  . loratadine  10 mg Oral Daily  . pantoprazole  40 mg Oral Daily  . polyethylene glycol  17 g Oral Daily  . sertraline  100 mg Oral Daily  . tranexamic acid (CYKLOKAPRON) topical -INTRAOP  2,000 mg Topical Once   Continuous Infusions: . methocarbamol (ROBAXIN) IV       LOS: 2 days    Time spent: 35 minutes    Verneita Griffes, MD [facility] ([phone]   If 7PM-7AM, please contact night-coverage [email or web address] Password [facility] 08/13/2017, 10:54 AM

## 2017-08-13 NOTE — Evaluation (Signed)
Occupational Therapy Evaluation Patient Details Name: Robyn Barton MRN: [id number] DOB: 1945/02/14 Today's Date: 08/13/2017    History of Present Illness Pt is a 72 y/o female admitted after falling at home in her kitchen injuring L hip and is now s/p L ANTERIOR APPROACH HEMI HIP ARTHROPLASTY.  PMH signficant for but not limited to: small cell lung cancer (remission after lobectomy, chemo/radiation), hiatal hernia, osteopenia, recurrent major depressive disorder, anxiety.    Clinical Impression   PTA patient was independent using cane for community mobility, completing ADLs/IADLs without assistance but reports fatiguing easily since chemo/radiation.  She currently requires min guard to setup for UB ADL seated, total assistance for LB ADL, and max assist for toilet transfers using RW.  She is significantly limited by pain in L hip, requiring increased time and effort for all activities.  Patient became nauseated multiple times throughout session and vomited x 2, BP monitored throughout (seated EOB 115/51, seated in recliner 99/45, supine 134/87 at completion of session) RN notified throughout session. Based on performance today and patient reports of limited to no assistance at home, recommend discharge to SNF for short term rehab in order to maximize independence and safety prior to dc home.  Will continue to follow while admitted.    Follow Up Recommendations  SNF;Supervision/Assistance - 24 hour    Equipment Recommendations  Other (comment)(TBD at next venue of care)    Recommendations for Other Services PT consult     Precautions / Restrictions Precautions Precautions: Fall;Anterior Hip Restrictions Weight Bearing Restrictions: Yes LLE Weight Bearing: Weight bearing as tolerated      Mobility Bed Mobility Overal bed mobility: Needs Assistance Bed Mobility: Supine to Sit     Supine to sit: Max assist;HOB elevated     General bed mobility comments: limited by pain, requiring  assistance for trunk and L LE   Transfers Overall transfer level: Needs assistance Equipment used: Rolling walker (2 wheeled) Transfers: Sit to/from [facility] Sit to Stand: Max assist Stand pivot transfers: Mod assist       General transfer comment: requires increaed time and effort to power up to stand, once standing able to pivot with decreased assist using RW to support with limited weightbearing through L LE     Balance Overall balance assessment: Needs assistance Sitting-balance support: Feet supported;Bilateral upper extremity supported Sitting balance-Leahy Scale: Fair     Standing balance support: During functional activity;Bilateral upper extremity supported Standing balance-Leahy Scale: Poor Standing balance comment: reliant on B UE and external support                           ADL either performed or assessed with clinical judgement   ADL Overall ADL's : Needs assistance/impaired Eating/Feeding: Set up;Sitting   Grooming: Set up;Sitting   Upper Body Bathing: Min guard;Sitting   Lower Body Bathing: Maximal assistance;Sitting/lateral leans Lower Body Bathing Details (indicate cue type and reason): limited reach to B LEs, limited sitting tolerance and pain limiting functio Upper Body Dressing : Min guard;Sitting Upper Body Dressing Details (indicate cue type and reason): for safety sitting unsupported Lower Body Dressing: Total assistance;Sit to/from stand;Cueing for compensatory techniques Lower Body Dressing Details (indicate cue type and reason): limited by pain with poor functional reach and impaired standing balance Toilet Transfer: Maximal assistance;Cueing for safety;Cueing for sequencing;Stand-pivot;RW(simulated to recliner ) Toilet Transfer Details (indicate cue type and reason): cueing for hand placement, safety, and technique; physical assistance and increased time/effort requierd  Toileting- Clothing Manipulation and Hygiene:  Total assistance;Sit to/from stand       Functional mobility during ADLs: Maximal assistance;Rolling walker(stand pivot only) General ADL Comments: Patient signficantly limited by pain and hypotension (possibly postural); 2 episodes of N&V at EOB and once returing supine.  BP monitored and nursing notified.      Vision Baseline Vision/History: Wears glasses Wears Glasses: At all times Patient Visual Report: No change from baseline Vision Assessment?: No apparent visual deficits     Perception     Praxis      Pertinent Vitals/Pain Pain Assessment: Faces Faces Pain Scale: Hurts whole lot Pain Location: L hip Pain Descriptors / Indicators: Discomfort;Crying;Operative site guarding;Aching Pain Intervention(s): Limited activity within patient's tolerance;Monitored during session;Premedicated before session;Repositioned;Utilized relaxation techniques     Hand Dominance     Extremity/Trunk Assessment Upper Extremity Assessment Upper Extremity Assessment: Generalized weakness   Lower Extremity Assessment Lower Extremity Assessment: LLE deficits/detail LLE Deficits / Details: s/p L anterior hip hemiarthroplasty       Communication Communication Communication: No difficulties   Cognition Arousal/Alertness: Awake/alert Behavior During Therapy: WFL for tasks assessed/performed Overall Cognitive Status: Within Functional Limits for tasks assessed                                     General Comments  nauseated and vomiting episodes x 2, BP monitored, nursing notified    Exercises     Shoulder Instructions      Home Living Family/patient expects to be discharged to:: Private residence Living Arrangements: Alone Available Help at Discharge: (friend/neighbor for only a day or two) Type of Home: House Home Access: Level entry     Home Layout: Two level;Able to live on main level with bedroom/bathroom     Bathroom Shower/Tub: Engineer, site: Standard     Home Equipment: Shower seat;Hand held shower head;Cane - single point;Walker - 2 wheels   Additional Comments: pt lives on 1st floor, does not access 2nd       Prior Functioning/Environment Level of Independence: Independent with assistive device(s)        Comments: used cane for mobility in community, furniture walking in home          OT Problem List: Decreased strength;Decreased activity tolerance;Decreased range of motion;Impaired balance (sitting and/or standing);Decreased safety awareness;Decreased knowledge of use of DME or AE;Decreased knowledge of precautions;Pain      OT Treatment/Interventions: Self-care/ADL training;Therapeutic exercise;DME and/or AE instruction;Therapeutic activities;Patient/family education;Balance training    OT Goals(Current goals can be found in the care plan section) Acute Rehab OT Goals Patient Stated Goal: to feel better OT Goal Formulation: With patient Time For Goal Achievement: 08/27/17 Potential to Achieve Goals: Good  OT Frequency: Min 2X/week   Barriers to D/C:            Co-evaluation              AM-PAC PT "6 Clicks" Daily Activity     Outcome Measure Help from another person eating meals?: None Help from another person taking care of personal grooming?: A Little Help from another person toileting, which includes using toliet, bedpan, or urinal?: Total Help from another person bathing (including washing, rinsing, drying)?: A Lot Help from another person to put on and taking off regular upper body clothing?: A Little Help from another person to put on and taking off regular lower body clothing?: Total  6 Click Score: 14   End of Session Equipment Utilized During Treatment: Rolling walker;Gait belt Nurse Communication: Mobility status;Other (comment)(status)  Activity Tolerance: Patient limited by pain;Other (comment)(nausea and vomiting ) Patient left: in bed;with call bell/phone within reach;with  bed alarm set;with SCD's reapplied  OT Visit Diagnosis: Other abnormalities of gait and mobility (R26.89);Muscle weakness (generalized) (M62.81);History of falling (Z91.81);Pain Pain - Right/Left: Left Pain - part of body: Hip                Time: 1000-1049 OT Time Calculation (min): 49 min Charges:  OT General Charges $OT Visit: 1 Visit OT Evaluation $OT Eval Moderate Complexity: 1 Mod OT Treatments $Self Care/Home Management : 23-37 mins  Delight Stare, OTR/L  Pager [place] 08/13/2017, 11:08 AM

## 2017-08-13 NOTE — Social Work (Signed)
CSW acknowledging consult for SNF placement, aware OT recommendations are for SNF. Placement may be more challenging as pt is currently undergoing treatment for cancer.   Will continue to follow.   Alexander Mt, [place] Work [phone]

## 2017-08-13 NOTE — Progress Notes (Signed)
In and out cath completed

## 2017-08-13 NOTE — Progress Notes (Signed)
Subjective: 1 Day Post-Op Procedure(s) (LRB): ANTERIOR APPROACH HEMI HIP ARTHROPLASTY (Left) Patient reports pain as mild and moderate.  Resting comfortably this am.  Objective: Vital signs in last 24 hours: Temp:  [97 F (36.1 C)-98.7 F (37.1 C)] 98.5 F (36.9 C) (07/31 0505) Pulse Rate:  [82-130] 91 (07/31 0505) Resp:  [14-28] 16 (07/31 0505) BP: (85-140)/(53-91) 106/56 (07/31 0505) SpO2:  [91 %-100 %] 91 % (07/31 0505)  Intake/Output from previous day: 07/30 0701 - 07/31 0700 In: 2491.7 [P.O.:300; I.V.:1781.7; IV Piggyback:410] Out: 2275 [Urine:2125; Blood:150] Intake/Output this shift: No intake/output data recorded.  Recent Labs    08/11/17 2128 08/12/17 0607 08/12/17 1950  HGB 11.6* 11.3* 8.8*   Recent Labs    08/12/17 0607 08/12/17 1950  WBC 7.4 9.7  RBC 3.82* 2.96*  HCT 34.2* 27.5*  PLT 209 147*   Recent Labs    08/11/17 2128 08/12/17 0607 08/12/17 1950  NA 136 138  --   K 3.7 4.4  --   CL 101 103  --   CO2 26 29  --   BUN 34* 29*  --   CREATININE 1.28* 1.20* 1.21*  GLUCOSE 118* 109*  --   CALCIUM 9.7 9.6  --    Recent Labs    08/11/17 2128  INR 0.95    Neurologically intact Neurovascular intact Sensation intact distally Intact pulses distally Dorsiflexion/Plantar flexion intact Incision: dressing C/D/I No cellulitis present Compartment soft    Assessment/Plan: 1 Day Post-Op Procedure(s) (LRB): ANTERIOR APPROACH HEMI HIP ARTHROPLASTY (Left) Up with therapy  WBAT LLE-no restrictions     Aundra Dubin 08/13/2017, 7:25 AM

## 2017-08-13 NOTE — Care Management Note (Signed)
Case Management Note  Patient Details  Name: Robyn Barton MRN: [id number] Date of Birth: July 17, 1945  Subjective/Objective:                    Action/Plan:  Await PT eval for recommendations Expected Discharge Date:  (unknown)               Expected Discharge Plan:     In-House Referral:  Clinical Social Work  Discharge planning Services  CM Consult  Post Acute Care Choice:  Durable Medical Equipment, Home Health Choice offered to:     DME Arranged:    DME Agency:     HH Arranged:    [place] Agency:     Status of Service:  In process, will continue to follow  If discussed at Long Length of Stay Meetings, dates discussed:    Additional Comments:  Marilu Favre, RN 08/13/2017, 10:26 AM

## 2017-08-13 NOTE — Clinical Social Work Note (Addendum)
Clinical Social Work Assessment  Patient Details  Name: Robyn Barton MRN: [id number] Date of Birth: Apr 11, 1945  Date of referral:                  Reason for consult:  Discharge Planning                Permission sought to share information with:  Facility Sport and exercise psychologist, Family Supports Permission granted to share information::  Yes, Verbal Permission Granted  Name::      Robyn Barton (introduced self as Maximino Sarin)  Agency::   SNFs (preference for SNFs near [facility])  Relationship::   daughter  Contact Information: [email or web address] and [phone]  Housing/Transportation Living arrangements for the past 2 months:  [place] of Information:  Patient, Adult Children Patient Interpreter Needed:  None Criminal Activity/Legal Involvement Pertinent to Current Situation/Hospitalization:  No - Comment as needed Significant Relationships:  Adult Children, Friend, Neighbor, Other Family Members Lives with:  Self Do you feel safe going back to the place where you live?  Yes(post rehab) Need for family participation in patient care:  Yes (Comment)  Care giving concerns:  Pt lives alone, has been weaker than normal following her treatments ending at the [facility]. Pt children live in [place] area of [place]. Pt has good friends that are supportive but unable to assist 24/7.    Social Worker assessment / plan:  CSW met with pt and pt daughter Robyn Barton at bedside. Pt appears tired but was amenable to speaking with CSW. Pt states she lives at home alone, but has supportive family and friends. Pt has completed treatment at [facility] and feels proud that she only has to go in for follow up appointments every 3 months. Pt reports that she has been to rehab previously and understands SNF process. Pt requests SNF closer to home in [facility] (specifically for a follow up with [facility]). Pt gives permission for her children to be involved in her care and  gave permission for her daughter to be emailed SNF offers when available.   CSW will initiate referral process, will follow up with pt and pt family regarding offers. Pt will need [facility] Medicare authorization for SNF discharge when medically appropriate. Pt and pt daughter state understanding for choice before insurance company closes on the weekend.   Employment status:  Retired Nurse, adult PT Recommendations:  [place], [place] / Referral to community resources:  [place]  Patient/Family's Response to care:  Pt and pt daughter amenable to visit with CSW, understand SNF process, they state understanding that there may be a copay and are willing to cover that following hospitalization.   Patient/Family's Understanding of and Emotional Response to Diagnosis, Current Treatment, and Prognosis:  Pt and pt daughter state understanding of diagnosis, current treatment and prognosis. Other than appearing tired, pt and pt daughter are both positive. Pt displays good emotional response and resilience despite multiple hospitalizations and going through intensive chemo and radiation treatments. Pt is hopeful for placement near to her home so that she can be closer to home and near her friends who she states give her good support.   Emotional Assessment Appearance:  Appears stated age Attitude/Demeanor/Rapport:  Gracious, Engaged Affect (typically observed):  Accepting, Adaptable, Appropriate, Hopeful, Pleasant Orientation:  Oriented to Self, Oriented to Place, Oriented to Situation, Oriented to  Time Alcohol / Substance use:  Not Applicable Psych involvement (Current  and /or in the community):  No (Comment)  Discharge Needs  Concerns to be addressed:  Care Coordination, Discharge Planning Concerns Readmission within the last 30 days:  No Current discharge risk:  Chronically ill, Dependent with Mobility Barriers to  Discharge:  Ship broker, Continued Medical Work up   [employer], [place] 08/13/2017, 1:56 PM

## 2017-08-13 NOTE — NC FL2 (Addendum)
[place] LEVEL OF CARE SCREENING TOOL     IDENTIFICATION  Patient Name: Robyn Barton Birthdate: 1945/05/13 Sex: female Admission Date (Current Location): 08/11/2017  [facility] and [place] Number:  Herbalist and Address:  [facility], [place] [address] [place], [place] [address]      Provider Number: [id number]  Attending Physician Name and Address:  Nita Sells, MD  Relative Name and Phone Number:   Bruce Mayers; daughter [phone] [name]; daughter [phone]    Current Level of Care: Hospital Recommended Level of Care: [place] Prior Approval Number:    Date Approved/Denied:   PASRR Number: [id number]  Discharge Plan: SNF    Current Diagnoses: Patient Active Problem List   Diagnosis Date Noted  . Hip fracture ([place]) 08/11/2017  . CKD (chronic kidney disease), stage III ([place]) 08/11/2017  . Paresthesia of both feet 04/17/2017  . Osteopenia 04/17/2017  . Depression, recurrent ([place]) 02/20/2017  . Anemia due to antineoplastic chemotherapy 12/18/2016  . Anxiety 11/26/2016  . Leukocytosis 11/24/2016  . Dyspnea 11/15/2016  . Goals of care, counseling/discussion 10/21/2016  . Encounter for antineoplastic chemotherapy 10/21/2016  . Mediastinal lymphadenopathy   . Small cell lung cancer, right upper lobe ([place]) 08/20/2016  . COPD (chronic obstructive pulmonary disease) ([place]) 08/20/2016  . ARDS (adult respiratory distress syndrome) ([place]) 08/20/2016    Orientation RESPIRATION BLADDER Height & Weight     Self, Time, Situation, Place  Normal Continent Weight: 112 lb (50.8 kg) Height:  5\' 5"  (165.1 cm)  BEHAVIORAL SYMPTOMS/MOOD NEUROLOGICAL BOWEL NUTRITION STATUS      Continent Diet(see discharge summary)  AMBULATORY STATUS COMMUNICATION OF NEEDS Skin   Extensive Assist Verbally Surgical wounds(incision on left hip; dry skin)                       Personal Care Assistance Level of  Assistance  Bathing, Feeding, Dressing Bathing Assistance: Maximum assistance Feeding assistance: Independent Dressing Assistance: Limited assistance     Functional Limitations Info  Sight, Hearing, Speech Sight Info: Adequate Hearing Info: Adequate Speech Info: Adequate    SPECIAL CARE FACTORS FREQUENCY  PT (By licensed PT), OT (By licensed OT)     PT Frequency: 5x week OT Frequency: 5x week            Contractures Contractures Info: Not present    Additional Factors Info  Code Status, Allergies Code Status Info: Full Code Allergies Info: No Known Allergies           Current Medications (08/13/2017):  This is the current hospital active medication list Current Facility-Administered Medications  Medication Dose Route Frequency Provider Last Rate Last Dose  . acetaminophen (TYLENOL) tablet 650 mg  650 mg Oral Q6H PRN Leandrew Koyanagi, MD       Or  . acetaminophen (TYLENOL) suppository 650 mg  650 mg Rectal Q6H PRN Leandrew Koyanagi, MD      . acetaminophen (TYLENOL) tablet 325-650 mg  325-650 mg Oral Q6H PRN Leandrew Koyanagi, MD      . diphenhydrAMINE (BENADRYL) capsule 25 mg  25 mg Oral QHS PRN Leandrew Koyanagi, MD       And  . acetaminophen (TYLENOL) tablet 500 mg  500 mg Oral QHS PRN Leandrew Koyanagi, MD      . acetaminophen (TYLENOL) tablet 500 mg  500 mg Oral Q6H Leandrew Koyanagi, MD   500 mg at 08/12/17 1852  .  albuterol (PROVENTIL) (2.5 MG/3ML) 0.083% nebulizer solution 2.5 mg  2.5 mg Nebulization Q6H PRN Leandrew Koyanagi, MD      . alum & mag hydroxide-simeth (MAALOX/MYLANTA) 200-200-20 MG/5ML suspension 30 mL  30 mL Oral Q4H PRN Leandrew Koyanagi, MD      . atorvastatin (LIPITOR) tablet 40 mg  40 mg Oral QHS Leandrew Koyanagi, MD   40 mg at 08/12/17 2213  . budesonide (PULMICORT) nebulizer solution 0.5 mg  0.5 mg Nebulization BID Leandrew Koyanagi, MD   0.5 mg at 08/13/17 2774  . calcium carbonate (TUMS - dosed in mg elemental calcium) chewable tablet 200 mg of elemental calcium  1 tablet Oral  Daily PRN Leandrew Koyanagi, MD      . cholecalciferol (VITAMIN D) tablet 2,000 Units  2,000 Units Oral Daily Leandrew Koyanagi, MD      . cyclobenzaprine (FLEXERIL) tablet 7.5 mg  7.5 mg Oral TID PRN Leandrew Koyanagi, MD   7.5 mg at 08/12/17 0014  . docusate sodium (COLACE) capsule 100 mg  100 mg Oral BID Leandrew Koyanagi, MD   100 mg at 08/13/17 0935  . enoxaparin (LOVENOX) injection 40 mg  40 mg Subcutaneous Q24H Leandrew Koyanagi, MD   40 mg at 08/13/17 0008  . guaiFENesin (MUCINEX) 12 hr tablet 600 mg  600 mg Oral BID PRN Leandrew Koyanagi, MD      . HYDROcodone-acetaminophen (NORCO) 7.5-325 MG per tablet 1-2 tablet  1-2 tablet Oral Q4H PRN Leandrew Koyanagi, MD   1 tablet at 08/13/17 0935  . HYDROcodone-acetaminophen (NORCO/VICODIN) 5-325 MG per tablet 1-2 tablet  1-2 tablet Oral Q4H PRN Leandrew Koyanagi, MD   1 tablet at 08/13/17 0009  . ipratropium (ATROVENT) nebulizer solution 0.5 mg  0.5 mg Nebulization Q2H PRN Leandrew Koyanagi, MD      . ipratropium (ATROVENT) nebulizer solution 0.5 mg  0.5 mg Nebulization BID Eugenie Filler, MD   0.5 mg at 08/13/17 1287  . levalbuterol (XOPENEX) nebulizer solution 0.63 mg  0.63 mg Nebulization Q2H PRN Leandrew Koyanagi, MD      . levalbuterol Penne Lash) nebulizer solution 0.63 mg  0.63 mg Nebulization BID Eugenie Filler, MD   0.63 mg at 08/13/17 8676  . loperamide (IMODIUM) capsule 2 mg  2 mg Oral PRN Leandrew Koyanagi, MD      . loratadine (CLARITIN) tablet 10 mg  10 mg Oral Daily Leandrew Koyanagi, MD      . magnesium citrate solution 1 Bottle  1 Bottle Oral Once PRN Leandrew Koyanagi, MD      . menthol-cetylpyridinium (CEPACOL) lozenge 3 mg  1 lozenge Oral PRN Leandrew Koyanagi, MD       Or  . phenol (CHLORASEPTIC) mouth spray 1 spray  1 spray Mouth/Throat PRN Leandrew Koyanagi, MD      . methocarbamol (ROBAXIN) tablet 500 mg  500 mg Oral Q6H PRN Leandrew Koyanagi, MD   500 mg at 08/12/17 1559   Or  . methocarbamol (ROBAXIN) 500 mg in dextrose 5 % 50 mL IVPB  500 mg Intravenous Q6H PRN Leandrew Koyanagi,  MD      . metoCLOPramide (REGLAN) tablet 5 mg  5 mg Oral TID AC Samtani, Jai-Gurmukh, MD      . morphine 2 MG/ML injection 1 mg  1 mg Intravenous Q2H PRN Leandrew Koyanagi, MD   1 mg at 08/13/17 0630  . morphine 2 MG/ML  injection 2 mg  2 mg Intravenous Q4H PRN Leandrew Koyanagi, MD   2 mg at 08/12/17 0846  . ondansetron (ZOFRAN) tablet 4 mg  4 mg Oral Q6H PRN Leandrew Koyanagi, MD       Or  . ondansetron [facility]) injection 4 mg  4 mg Intravenous Q6H PRN Leandrew Koyanagi, MD   4 mg at 08/13/17 0535  . ondansetron (ZOFRAN) tablet 4 mg  4 mg Oral Q6H PRN Leandrew Koyanagi, MD      . oxyCODONE (Oxy IR/ROXICODONE) immediate release tablet 10 mg  10 mg Oral Q4H PRN Leandrew Koyanagi, MD   10 mg at 08/12/17 0123  . pantoprazole (PROTONIX) EC tablet 40 mg  40 mg Oral Daily Leandrew Koyanagi, MD   40 mg at 08/13/17 0935  . polyethylene glycol (MIRALAX / GLYCOLAX) packet 17 g  17 g Oral Daily Leandrew Koyanagi, MD   17 g at 08/13/17 0936  . polyethylene glycol (MIRALAX / GLYCOLAX) packet 17 g  17 g Oral Daily PRN Leandrew Koyanagi, MD   17 g at 08/12/17 2213  . sertraline (ZOLOFT) tablet 100 mg  100 mg Oral Daily Leandrew Koyanagi, MD   100 mg at 08/13/17 0935  . sorbitol 70 % solution 30 mL  30 mL Oral Daily PRN Leandrew Koyanagi, MD      . tranexamic acid (CYKLOKAPRON) 2,000 mg in sodium chloride 0.9 % 50 mL Topical Application  8,138 mg Topical Once Leandrew Koyanagi, MD      . traZODone (DESYREL) tablet 25 mg  25 mg Oral QHS PRN Leandrew Koyanagi, MD         Discharge Medications: Please see discharge summary for a list of discharge medications.  Relevant Imaging Results:  Relevant Lab Results:   Additional Information SS# [place]  [place], [place]

## 2017-08-13 NOTE — Progress Notes (Signed)
Post bladder scan residual was 746cc. NP on call paged. Awaiting any further orders.

## 2017-08-13 NOTE — Evaluation (Signed)
Physical Therapy Evaluation Patient Details Name: Robyn Barton MRN: [id number] DOB: [birth year] Today's Date: 08/13/2017   History of Present Illness  Pt is a 72 y/o female admitted after falling at home in her kitchen injuring L hip and is now s/p L ANTERIOR APPROACH HEMI HIP ARTHROPLASTY.  PMH signficant for but not limited to: small cell lung cancer (remission after lobectomy, chemo/radiation), hiatal hernia, osteopenia, recurrent major depressive disorder, anxiety.     Clinical Impression  Pt admitted with above diagnosis. Pt currently with functional limitations due to the deficits listed below (see PT Problem List). PTA pt lived at home alone, utilizing a cane for ambulation in the community. On eval, she required max assist bed mobility. Unable to progress beyond EOB due to nausea, pain and anxiety. BP 121/81 supine and 123/65 sitting. During OT eval this AM, pt vomited x 2 and required immediate return to bed after transfer to recliner due to orthostasis (BP dropped to 99/45). Pt will benefit from skilled PT to increase their independence and safety with mobility to allow discharge to the venue listed below.       Follow Up Recommendations SNF    Equipment Recommendations  Other (comment)(defer to next venue)    Recommendations for Other Services       Precautions / Restrictions Precautions Precautions: Fall Restrictions Weight Bearing Restrictions: Yes LLE Weight Bearing: Weight bearing as tolerated      Mobility  Bed Mobility Overal bed mobility: Needs Assistance Bed Mobility: Supine to Sit;Sit to Supine     Supine to sit: Max assist;HOB elevated Sit to supine: HOB elevated;Max assist   General bed mobility comments: +rail, max cues for sequencing, increased time and effort  Transfers Overall transfer level: Needs assistance Equipment used: Rolling walker (2 wheeled) Transfers: Sit to/from [facility] Sit to Stand: Max assist Stand pivot  transfers: Mod assist       General transfer comment: unable to progress beyond EOB due to nausea  Ambulation/Gait             General Gait Details: unable   Stairs            Wheelchair Mobility    Modified Rankin (Stroke Patients Only)       Balance Overall balance assessment: Needs assistance Sitting-balance support: Feet supported;Single extremity supported Sitting balance-Leahy Scale: Fair     Standing balance support: During functional activity;Bilateral upper extremity supported Standing balance-Leahy Scale: Poor Standing balance comment: reliant on B UE and external support                             Pertinent Vitals/Pain Pain Assessment: Faces Faces Pain Scale: Hurts whole lot Pain Location: L hip during mobility Pain Descriptors / Indicators: Grimacing;Moaning;Operative site guarding Pain Intervention(s): Limited activity within patient's tolerance;Monitored during session;Repositioned    Home Living Family/patient expects to be discharged to:: Private residence Living Arrangements: Alone Available Help at Discharge: Friend(s);Available PRN/intermittently Type of Home: House Home Access: Level entry     Home Layout: Two level;Able to live on main level with bedroom/bathroom Home Equipment: Shower seat;Hand held shower head;Cane - single point;Walker - 2 wheels Additional Comments: pt lives on 1st floor, does not access 2nd     Prior Function Level of Independence: Independent with assistive device(s)         Comments: used cane for mobility in community, furniture walking in home       Hand Dominance  Dominant Hand: Right    Extremity/Trunk Assessment   Upper Extremity Assessment Upper Extremity Assessment: Defer to OT evaluation    Lower Extremity Assessment Lower Extremity Assessment: LLE deficits/detail LLE Deficits / Details: s/p L anterior hip hemiarthroplasty LLE: Unable to fully assess due to pain     Cervical / Trunk Assessment Cervical / Trunk Assessment: Kyphotic  Communication   Communication: No difficulties  Cognition Arousal/Alertness: Awake/alert Behavior During Therapy: Anxious Overall Cognitive Status: Within Functional Limits for tasks assessed                                 General Comments: High anxiety. Needs step-by-step instructions/explanation during mobility to ease her anxiety.      General Comments General comments (skin integrity, edema, etc.): nauseated and vomiting episodes x 2, BP monitored, nursing notified    Exercises     Assessment/Plan    PT Assessment Patient needs continued PT services  PT Problem List Decreased strength;Decreased mobility;Decreased activity tolerance;Pain;Decreased knowledge of use of DME;Decreased balance       PT Treatment Interventions DME instruction;Therapeutic activities;Gait training;Therapeutic exercise;Patient/family education;Balance training;Functional mobility training    PT Goals (Current goals can be found in the Care Plan section)  Acute Rehab PT Goals Patient Stated Goal: decrease pain PT Goal Formulation: With patient/family Time For Goal Achievement: 08/27/17 Potential to Achieve Goals: Fair    Frequency Min 3X/week   Barriers to discharge Decreased caregiver support      Co-evaluation               AM-PAC PT "6 Clicks" Daily Activity  Outcome Measure Difficulty turning over in bed (including adjusting bedclothes, sheets and blankets)?: Unable Difficulty moving from lying on back to sitting on the side of the bed? : Unable Difficulty sitting down on and standing up from a chair with arms (e.g., wheelchair, bedside commode, etc,.)?: Unable Help needed moving to and from a bed to chair (including a wheelchair)?: A Lot Help needed walking in hospital room?: Total Help needed climbing 3-5 steps with a railing? : Total 6 Click Score: 7    End of Session   Activity Tolerance:  Patient limited by pain;Other (comment)(anxiety, nausea) Patient left: in bed;with call bell/phone within reach;with nursing/sitter in room;with family/visitor present;with SCD's reapplied Nurse Communication: Mobility status PT Visit Diagnosis: Other abnormalities of gait and mobility (R26.89);Difficulty in walking, not elsewhere classified (R26.2);Pain Pain - Right/Left: Left Pain - part of body: Hip    Time: 5366-4403 PT Time Calculation (min) (ACUTE ONLY): 27 min   Charges:   PT Evaluation $PT Eval Moderate Complexity: 1 Mod PT Treatments $Therapeutic Activity: 8-22 mins        Lorrin Goodell, PT  Office # [phone] Pager [phone]   Lorriane Shire 08/13/2017, 1:29 PM

## 2017-08-14 LAB — CBC WITH DIFFERENTIAL/PLATELET
Abs Immature Granulocytes: 0.1 10*3/uL (ref 0.0–0.1)
Basophils Absolute: 0 10*3/uL (ref 0.0–0.1)
Basophils Relative: 0 %
Eosinophils Absolute: 0 10*3/uL (ref 0.0–0.7)
Eosinophils Relative: 0 %
HEMATOCRIT: 23.4 % — AB (ref 36.0–46.0)
HEMOGLOBIN: 7.7 g/dL — AB (ref 12.0–15.0)
Immature Granulocytes: 1 %
LYMPHS ABS: 0.4 10*3/uL — AB (ref 0.7–4.0)
LYMPHS PCT: 5 %
MCH: 29.5 pg (ref 26.0–34.0)
MCHC: 32.9 g/dL (ref 30.0–36.0)
MCV: 89.7 fL (ref 78.0–100.0)
Monocytes Absolute: 0.6 10*3/uL (ref 0.1–1.0)
Monocytes Relative: 7 %
Neutro Abs: 7.2 10*3/uL (ref 1.7–7.7)
Neutrophils Relative %: 87 %
Platelets: 136 10*3/uL — ABNORMAL LOW (ref 150–400)
RBC: 2.61 MIL/uL — AB (ref 3.87–5.11)
RDW: 15.5 % (ref 11.5–15.5)
WBC: 8.2 10*3/uL (ref 4.0–10.5)

## 2017-08-14 LAB — PREPARE RBC (CROSSMATCH)

## 2017-08-14 LAB — COMPREHENSIVE METABOLIC PANEL
ALBUMIN: 2.8 g/dL — AB (ref 3.5–5.0)
ALT: 9 U/L (ref 0–44)
ANION GAP: 10 (ref 5–15)
AST: 19 U/L (ref 15–41)
Alkaline Phosphatase: 55 U/L (ref 38–126)
BILIRUBIN TOTAL: 0.9 mg/dL (ref 0.3–1.2)
BUN: 13 mg/dL (ref 8–23)
CHLORIDE: 101 mmol/L (ref 98–111)
CO2: 25 mmol/L (ref 22–32)
Calcium: 8.8 mg/dL — ABNORMAL LOW (ref 8.9–10.3)
Creatinine, Ser: 1.09 mg/dL — ABNORMAL HIGH (ref 0.44–1.00)
GFR calc Af Amer: 57 mL/min — ABNORMAL LOW (ref >60)
GFR calc non Af Amer: 49 mL/min — ABNORMAL LOW (ref >60)
GLUCOSE: 113 mg/dL — AB (ref 70–99)
POTASSIUM: 4 mmol/L (ref 3.5–5.1)
Sodium: 136 mmol/L (ref 135–145)
Total Protein: 5.9 g/dL — ABNORMAL LOW (ref 6.5–8.1)

## 2017-08-14 LAB — URINE CULTURE: Culture: NO GROWTH

## 2017-08-14 MED ORDER — FUROSEMIDE 10 MG/ML IJ SOLN: 20.0000 mg | Freq: Once | INTRAMUSCULAR | Status: DC

## 2017-08-14 MED ORDER — ACETAMINOPHEN 325 MG PO TABS
650.0000 mg | ORAL_TABLET | Freq: Once | ORAL | Status: AC
  Administered 2017-08-14: 650 mg via ORAL
  Filled 2017-08-14: qty 2

## 2017-08-14 MED ORDER — SODIUM CHLORIDE 0.9% IV SOLUTION
Freq: Once | INTRAVENOUS | Status: AC
  Administered 2017-08-14: 09:00:00 via INTRAVENOUS

## 2017-08-14 MED ORDER — IPRATROPIUM BROMIDE 0.02 % IN SOLN: 0.5000 mg | Freq: Four times a day (QID) | RESPIRATORY_TRACT | Status: DC | PRN

## 2017-08-14 MED ORDER — ONDANSETRON 4 MG PO TBDP
4.0000 mg | ORAL_TABLET | Freq: Three times a day (TID) | ORAL | Status: DC | PRN
  Administered 2017-08-14: 4 mg via ORAL
  Filled 2017-08-14: qty 1

## 2017-08-14 MED ORDER — DIPHENHYDRAMINE HCL 25 MG PO CAPS
25.0000 mg | ORAL_CAPSULE | Freq: Once | ORAL | Status: AC
  Administered 2017-08-14: 25 mg via ORAL
  Filled 2017-08-14: qty 1

## 2017-08-14 MED ORDER — LEVALBUTEROL HCL 0.63 MG/3ML IN NEBU
0.6300 mg | INHALATION_SOLUTION | Freq: Four times a day (QID) | RESPIRATORY_TRACT | Status: DC | PRN
Start: 2017-08-14 — End: 2017-08-15

## 2017-08-14 NOTE — Progress Notes (Signed)
Subjective: 2 Days Post-Op Procedure(s) (LRB): ANTERIOR APPROACH HEMI HIP ARTHROPLASTY (Left) Patient reports pain as moderate.  Patient with nausea overnight, but has a hx of chronic nausea.  No lightheadedness/dizziness, chest pain/sob.  Not mobilizing well with PT  Objective: Vital signs in last 24 hours: Temp:  [98.1 F (36.7 C)-99.2 F (37.3 C)] 98.6 F (37 C) (08/01 0614) Pulse Rate:  [96-105] 97 (08/01 0614) Resp:  [15-17] 16 (08/01 0614) BP: (118-125)/(55-82) 125/82 (08/01 0614) SpO2:  [91 %-99 %] 99 % (08/01 0614)  Intake/Output from previous day: 07/31 0701 - 08/01 0700 In: 416 [P.O.:416] Out: 1725 [Urine:1725] Intake/Output this shift: No intake/output data recorded.  Recent Labs    08/11/17 2128 08/12/17 0607 08/12/17 1950 08/13/17 0557 08/14/17 0503  HGB 11.6* 11.3* 8.8* 8.1* 7.7*   Recent Labs    08/13/17 0557 08/14/17 0503  WBC 6.1 8.2  RBC 2.75* 2.61*  HCT 25.5* 23.4*  PLT 129* 136*   Recent Labs    08/13/17 0557 08/14/17 0503  NA 138 136  K 3.8 4.0  CL 105 101  CO2 26 25  BUN 18 13  CREATININE 1.12* 1.09*  GLUCOSE 89 113*  CALCIUM 8.9 8.8*   Recent Labs    08/11/17 2128  INR 0.95    Neurologically intact Neurovascular intact Sensation intact distally Intact pulses distally Dorsiflexion/Plantar flexion intact Incision: dressing C/D/I No cellulitis present Compartment soft    Assessment/Plan: 2 Days Post-Op Procedure(s) (LRB): ANTERIOR APPROACH HEMI HIP ARTHROPLASTY (Left) Up with therapy  WBAT LLE- no precautions ABLA- hemoglobin 7.3 this am. Fairly asymptomatic.  Will defer ? Transfusion to medicine team Discussed with patient importance of mobilizing with PT D/C dispo per medicine Will need to f/u with Dr. Erlinda Hong in office in two weeks      Aundra Dubin 08/14/2017, 7:38 AM

## 2017-08-14 NOTE — Progress Notes (Signed)
PROGRESS NOTE    Robyn Barton  UXL:[id number] DOB: 04-29-1945 DOA: 08/11/2017 PCP: Shelda Pal, DO    Brief Narrative:   Patient is a 72 year old female with history of small cell lung cancer in remission status post lobectomy, chemo/radiation who presented to the ED after a fall at home and noted to have a left hip fracture.  Patient seen in consultation by orthopedics and patient for left hip repair at [facility] 08/12/2017.   Assessment & Plan:   Principal Problem:   Hip fracture ([place]) Active Problems:   Small cell lung cancer, right upper lobe (HCC)   COPD (chronic obstructive pulmonary disease) (HCC)   Leukocytosis   Anemia due to antineoplastic chemotherapy   Depression, recurrent (HCC)   CKD (chronic kidney disease), stage III (HCC)  #1 left subcapital femoral neck fracture status post surgery 08/12/2017 Secondary to mechanical fall patient  Chest x-ray with no acute abnormalities.  Patient with no chest pain.  EKG with no ischemic changes noted.  Continue expected postop care management of pain, DVT prophylaxis, weightbearing status etc. to orthopedics If hemoglobin drops below 7 would transfuse 1 unit of blood--- labs in a.m.  2.  COPD Pulmicort nebs, Xopenex and Atrovent nebs to optimize her pulmonary status. Continue postop incentive spirometer.  3.  Leukocytosis Likely secondary to stress demargination secondary to problem #1.  Chest x-ray negative for any acute infiltrates.  Will check a UA with cultures and sensitivities.  Follow.  4.  Chronic kidney disease stage II Stable. Needs OP labs  5.  Anemia H&H stable.  Follow postoperatively.  6.  Right upper lobe small cell lung cancer s/p XRT Status post lobectomy, chemotherapy and radiation.  In remission.  Patient with recent CT chest done on 08/01/2017 with no evidence of recurrent metastatic disease.  Outpatient follow-up with oncology.  7.  Depression Stable.  Continue Zoloft.  8.  Retention Cannot void Foley placed and will need void trial at SNF and Urology OP appt if    DVT prophylaxis: SCDs.  Postop per orthopedics. Code Status: Full Family Communication: Updated patient.  Discussed with daughter Disposition Plan:  SNF possible in am 08/15/17   Consultants:   Orthopedics: Dr. Erlinda Hong 08/12/2017  Procedures:   Chest x-ray 08/11/2017  Plain films left hip and pelvis 08/11/2017  Antimicrobials:   None   Subjective:  No new issues Sleepy a bit  Objective: Vitals:   08/14/17 0933 08/14/17 0948 08/14/17 1233 08/14/17 1417  BP: 117/65 (!) 110/54 (!) 132/54 138/65  Pulse: 98 94 78 82  Resp: 16 16 14 16   Temp: 98.5 F (36.9 C) 98 F (36.7 C) 97.9 F (36.6 C) 97.7 F (36.5 C)  TempSrc: Oral Oral Oral Oral  SpO2: 97% 98% 99% 97%  Weight:      Height:        Intake/Output Summary (Last 24 hours) at 08/14/2017 1704 Last data filed at 08/14/2017 1420 Gross per 24 hour  Intake 298 ml  Output 2575 ml  Net -2277 ml   Filed Weights   08/11/17 1943  Weight: 50.8 kg (112 lb)    Examination:  Awake alert in nad, decreased hair eomi ncat cta b no adde dsound abd soft nt nd   No le edema Pulses ok Neuro intact   Data Reviewed: I have personally reviewed following labs and imaging studies  CBC: Recent Labs  Lab 08/11/17 2128 08/12/17 0607 08/12/17 1950 08/13/17 0557 08/14/17 0503  WBC 10.9* 7.4 9.7  6.1 8.2  NEUTROABS 9.5*  --   --   --  7.2  HGB 11.6* 11.3* 8.8* 8.1* 7.7*  HCT 35.1* 34.2* 27.5* 25.5* 23.4*  MCV 88.4 89.5 92.9 92.7 89.7  PLT 227 209 147* 129* 427*   Basic Metabolic Panel: Recent Labs  Lab 08/11/17 2128 08/12/17 0607 08/12/17 1950 08/13/17 0557 08/14/17 0503  NA 136 138  --  138 136  K 3.7 4.4  --  3.8 4.0  CL 101 103  --  105 101  CO2 26 29  --  26 25  GLUCOSE 118* 109*  --  89 113*  BUN 34* 29*  --  18 13  CREATININE 1.28* 1.20* 1.21* 1.12* 1.09*  CALCIUM 9.7 9.6  --  8.9 8.8*   GFR: Estimated Creatinine  Clearance: 36.3 mL/min (A) (by C-G formula based on SCr of 1.09 mg/dL (H)). Liver Function Tests: Recent Labs  Lab 08/11/17 2128 08/14/17 0503  AST 19 19  ALT 16 9  ALKPHOS 60 55  BILITOT 0.7 0.9  PROT 7.7 5.9*  ALBUMIN 4.0 2.8*   No results for input(s): LIPASE, AMYLASE in the last 168 hours. No results for input(s): AMMONIA in the last 168 hours. Coagulation Profile: Recent Labs  Lab 08/11/17 2128  INR 0.95   Cardiac Enzymes: No results for input(s): CKTOTAL, CKMB, CKMBINDEX, TROPONINI in the last 168 hours. BNP (last 3 results) No results for input(s): PROBNP in the last 8760 hours. HbA1C: No results for input(s): HGBA1C in the last 72 hours. CBG: No results for input(s): GLUCAP in the last 168 hours. Lipid Profile: No results for input(s): CHOL, HDL, LDLCALC, TRIG, CHOLHDL, LDLDIRECT in the last 72 hours. Thyroid Function Tests: No results for input(s): TSH, T4TOTAL, FREET4, T3FREE, THYROIDAB in the last 72 hours. Anemia Panel: No results for input(s): VITAMINB12, FOLATE, FERRITIN, TIBC, IRON, RETICCTPCT in the last 72 hours. Sepsis Labs: No results for input(s): PROCALCITON, LATICACIDVEN in the last 168 hours.  Recent Results (from the past 240 hour(s))  Culture, Urine     Status: None   Collection Time: 08/13/17 12:20 AM  Result Value Ref Range Status   Specimen Description URINE, CATHETERIZED  Final   Special Requests NONE  Final   Culture   Final    NO GROWTH Performed at [facility], [address] [place], [place] [address]    Report Status 08/14/2017 FINAL  Final         Radiology Studies: No results found.      Scheduled Meds: . atorvastatin  40 mg Oral QHS  . budesonide (PULMICORT) nebulizer solution  0.5 mg Nebulization BID  . cholecalciferol  2,000 Units Oral Daily  . enoxaparin (LOVENOX) injection  40 mg Subcutaneous Q24H  . ipratropium  0.5 mg Nebulization BID  . levalbuterol  0.63 mg Nebulization BID  . loratadine  10 mg  Oral Daily  . pantoprazole  40 mg Oral Daily  . polyethylene glycol  17 g Oral Daily  . sertraline  100 mg Oral Daily  . tranexamic acid (CYKLOKAPRON) topical -INTRAOP  2,000 mg Topical Once   Continuous Infusions:    LOS: 3 days    Time spent: 35 minutes   Verneita Griffes, MD [facility] 5:04 PM   If 7PM-7AM, please contact night-coverage [email or web address] Password [email or web address] 08/14/2017, 5:04 PM

## 2017-08-14 NOTE — Care Management Important Message (Signed)
Important Message  Patient Details  Name: Robyn Barton MRN: [id number] Date of Birth: [birth year]   Medicare Important Message Given:  Yes    Garo Heidelberg [address] 08/14/2017, 1:34 PM

## 2017-08-14 NOTE — Progress Notes (Signed)
Physical Therapy Treatment Patient Details Name: Robyn Barton MRN: [id number] DOB: 1945/06/14 Today's Date: 08/14/2017    History of Present Illness Pt is a 72 y/o female admitted after falling at home in her kitchen injuring L hip and is now s/p L ANTERIOR APPROACH HEMI HIP ARTHROPLASTY.  PMH signficant for but not limited to: small cell lung cancer (remission after lobectomy, chemo/radiation), hiatal hernia, osteopenia, recurrent major depressive disorder, anxiety.     PT Comments    Pt progressing slowly towards physical therapy goals. As pt is a high fall risk and receiving blood transfusion at the time of PT session, limited mobility to chair>bed only. Will continue to follow and progress as able per POC.   Follow Up Recommendations  SNF;Supervision/Assistance - 24 hour     Equipment Recommendations  None recommended by PT(TBD by next venue of care)    Recommendations for Other Services       Precautions / Restrictions Precautions Precautions: Fall Restrictions Weight Bearing Restrictions: Yes LLE Weight Bearing: Weight bearing as tolerated    Mobility  Bed Mobility Overal bed mobility: Needs Assistance Bed Mobility: Sit to Supine       Sit to supine: Mod assist;+2 for physical assistance   General bed mobility comments: +2 assist required for LE elevation up onto bed and controlled lower of trunk to supine.   Transfers Overall transfer level: Needs assistance Equipment used: Rolling walker (2 wheeled) Transfers: Sit to/from Stand Sit to Stand: Mod assist;+2 physical assistance         General transfer comment: VC's for hand placement on seated surface for safety. +2 assist for power-up to full standing position.   Ambulation/Gait Ambulation/Gait assistance: Min assist;+2 safety/equipment Gait Distance (Feet): 6 Feet Assistive device: Rolling walker (2 wheeled) Gait Pattern/deviations: Step-to pattern;Decreased stride length;Trunk flexed Gait velocity:  Decreased Gait velocity interpretation: <1.8 ft/sec, indicate of risk for recurrent falls General Gait Details: VC's for sequencing and general safety with the RW. Pt initially required assist to advance operative LE but was able to progress by end of gait training to advance without assist.    Stairs             Wheelchair Mobility    Modified Rankin (Stroke Patients Only)       Balance Overall balance assessment: Needs assistance Sitting-balance support: Feet supported;No upper extremity supported Sitting balance-Leahy Scale: Fair     Standing balance support: Bilateral upper extremity supported;During functional activity Standing balance-Leahy Scale: Poor Standing balance comment: Reliant on UE support                            Cognition Arousal/Alertness: Lethargic;Suspect due to medications Behavior During Therapy: Anxious Overall Cognitive Status: Within Functional Limits for tasks assessed                                        Exercises      General Comments        Pertinent Vitals/Pain Pain Assessment: Faces Faces Pain Scale: Hurts even more Pain Location: L hip during mobility Pain Descriptors / Indicators: Grimacing;Moaning;Operative site guarding Pain Intervention(s): Limited activity within patient's tolerance;Monitored during session;Repositioned    Home Living                      Prior Function  PT Goals (current goals can now be found in the care plan section) Acute Rehab PT Goals Patient Stated Goal: States she would like to be more alert - lethargic from anti nausea meds PT Goal Formulation: With patient Time For Goal Achievement: 08/27/17 Potential to Achieve Goals: Fair Progress towards PT goals: Progressing toward goals    Frequency    Min 3X/week      PT Plan Current plan remains appropriate    Co-evaluation              AM-PAC PT "6 Clicks" Daily Activity   Outcome Measure  Difficulty turning over in bed (including adjusting bedclothes, sheets and blankets)?: Unable Difficulty moving from lying on back to sitting on the side of the bed? : Unable Difficulty sitting down on and standing up from a chair with arms (e.g., wheelchair, bedside commode, etc,.)?: Unable Help needed moving to and from a bed to chair (including a wheelchair)?: A Lot Help needed walking in hospital room?: A Lot Help needed climbing 3-5 steps with a railing? : Total 6 Click Score: 8    End of Session Equipment Utilized During Treatment: Gait belt Activity Tolerance: Patient limited by lethargy Patient left: in bed;with call bell/phone within reach;with bed alarm set Nurse Communication: Mobility status PT Visit Diagnosis: Other abnormalities of gait and mobility (R26.89);Difficulty in walking, not elsewhere classified (R26.2);Pain Pain - Right/Left: Left Pain - part of body: Hip     Time: 1100-1118 PT Time Calculation (min) (ACUTE ONLY): 18 min  Charges:  $Gait Training: 8-22 mins                     Rolinda Roan, PT, DPT Acute Rehabilitation Services Pager: [phone]    Thelma Comp 08/14/2017, 11:52 AM

## 2017-08-14 NOTE — Plan of Care (Signed)
  Problem: Education: Goal: Knowledge of General Education information will improve Description: Including pain rating scale, medication(s)/side effects and non-pharmacologic comfort measures Outcome: Progressing   Problem: Clinical Measurements: Goal: Respiratory complications will improve Outcome: Progressing Goal: Cardiovascular complication will be avoided Outcome: Progressing   Problem: Safety: Goal: Ability to remain free from injury will improve Outcome: Progressing   Problem: Skin Integrity: Goal: Risk for impaired skin integrity will decrease Outcome: Progressing   

## 2017-08-14 NOTE — Progress Notes (Signed)
Patient tried to use bedpan @ 0000 with no success. Sat on BSC for 15 minutes with no success to void at 0300. Stated has trouble starting flow at home at times.  Paged provider Jeannette Corpus of status, bladder scan ordered.

## 2017-08-15 LAB — CBC
HEMATOCRIT: 31.3 % — AB (ref 36.0–46.0)
HEMOGLOBIN: 10.3 g/dL — AB (ref 12.0–15.0)
MCH: 29.9 pg (ref 26.0–34.0)
MCHC: 32.9 g/dL (ref 30.0–36.0)
MCV: 90.7 fL (ref 78.0–100.0)
Platelets: 154 10*3/uL (ref 150–400)
RBC: 3.45 MIL/uL — ABNORMAL LOW (ref 3.87–5.11)
RDW: 15.2 % (ref 11.5–15.5)
WBC: 5.5 10*3/uL (ref 4.0–10.5)

## 2017-08-15 LAB — TYPE AND SCREEN
ABO/RH(D): B POS
Antibody Screen: NEGATIVE
UNIT DIVISION: 0

## 2017-08-15 LAB — BPAM RBC
BLOOD PRODUCT EXPIRATION DATE: 201908252359
ISSUE DATE / TIME: 201908010928
UNIT TYPE AND RH: 7300

## 2017-08-15 NOTE — Progress Notes (Signed)
Occupational Therapy Treatment Patient Details Name: Robyn Barton MRN: [id number] DOB: [birth year] Today's Date: 08/15/2017    History of present illness Pt is a 72 y/o female admitted after falling at home in her kitchen injuring L hip and is now s/p L ANTERIOR APPROACH HEMI HIP ARTHROPLASTY.  PMH signficant for but not limited to: small cell lung cancer (remission after lobectomy, chemo/radiation), hiatal hernia, osteopenia, recurrent major depressive disorder, anxiety.    OT comments  Patient progressing well.  She demonstrates ability to complete toileting with min assist, toilet transfers with min guard assist, and LB dressing with moderate assistance.  She presents decreased anxiety and is able to recall safe transfer techniques without cueing. Pain is much more tolerable, but she continues to be limited by decreased activity tolerance, generalized weakness, and impaired balance.  Plans to dc to SNF to maximize safety and independence prior.  Will continue to follow while admitted.    Follow Up Recommendations  SNF;Supervision/Assistance - 24 hour    Equipment Recommendations  Other (comment)(TBD at next venue of care)    Recommendations for Other Services PT consult    Precautions / Restrictions Precautions Precautions: Fall Restrictions Weight Bearing Restrictions: Yes LLE Weight Bearing: Weight bearing as tolerated       Mobility Bed Mobility               General bed mobility comments: seated OOB on BSC upon entry   Transfers Overall transfer level: Needs assistance Equipment used: Rolling walker (2 wheeled) Transfers: Sit to/from Stand Sit to Stand: Min guard         General transfer comment: Patient able to recall correct hand placement and sequencing, min guard for safety     Balance Overall balance assessment: Needs assistance Sitting-balance support: Feet supported;No upper extremity supported Sitting balance-Leahy Scale: Fair     Standing balance  support: Bilateral upper extremity supported;During functional activity Standing balance-Leahy Scale: Poor Standing balance comment: reliant on B UE support                           ADL either performed or assessed with clinical judgement   ADL Overall ADL's : Needs assistance/impaired     Grooming: Set up;Wash/dry hands;Sitting               Lower Body Dressing: Moderate assistance;Sit to/from stand;Cueing for compensatory techniques Lower Body Dressing Details (indicate cue type and reason): patient continues to require assistance for socks due to decreased reach, able to adjust and pull up socks only Toilet Transfer: Min guard;Ambulation;RW;BSC Toilet Transfer Details (indicate cue type and reason): min guard for safety, increased time and effort but able to recall and demonstrate safe transfer technique  Toileting- Clothing Manipulation and Hygiene: Minimal assistance;Sitting/lateral lean Toileting - Clothing Manipulation Details (indicate cue type and reason): min assist for clothing management during toileting      Functional mobility during ADLs: Min guard;Rolling walker General ADL Comments: Patient progressing well.  Decreaed pain and guarding with mobility, slow and controlled.      Vision       Perception     Praxis      Cognition Arousal/Alertness: Awake/alert Behavior During Therapy: WFL for tasks assessed/performed Overall Cognitive Status: Within Functional Limits for tasks assessed  Exercises     Shoulder Instructions       General Comments      Pertinent Vitals/ Pain       Pain Assessment: Faces Faces Pain Scale: Hurts little more Pain Location: L hip during mobility Pain Descriptors / Indicators: Discomfort;Operative site guarding Pain Intervention(s): Monitored during session;Repositioned;Ice applied  Home Living                                           Prior Functioning/Environment              Frequency  Min 2X/week        Progress Toward Goals  OT Goals(current goals can now be found in the care plan section)  Progress towards OT goals: Progressing toward goals  Acute Rehab OT Goals Patient Stated Goal: to go to rehab OT Goal Formulation: With patient Time For Goal Achievement: 08/27/17 Potential to Achieve Goals: Good  Plan Discharge plan remains appropriate;Frequency remains appropriate    Co-evaluation                 AM-PAC PT "6 Clicks" Daily Activity     Outcome Measure   Help from another person eating meals?: None Help from another person taking care of personal grooming?: A Little Help from another person toileting, which includes using toliet, bedpan, or urinal?: A Little Help from another person bathing (including washing, rinsing, drying)?: A Lot Help from another person to put on and taking off regular upper body clothing?: None Help from another person to put on and taking off regular lower body clothing?: A Lot 6 Click Score: 18    End of Session Equipment Utilized During Treatment: Rolling walker;Gait belt  OT Visit Diagnosis: Other abnormalities of gait and mobility (R26.89);Muscle weakness (generalized) (M62.81);History of falling (Z91.81);Pain Pain - Right/Left: Left Pain - part of body: Hip   Activity Tolerance Patient tolerated treatment well   Patient Left in chair;with chair alarm set;with call bell/phone within reach   Nurse Communication Mobility status        Time: 3570-1779 OT Time Calculation (min): 19 min  Charges: OT General Charges $OT Visit: 1 Visit OT Treatments $Self Care/Home Management : 8-22 mins  Delight Stare, OTR/L  Pager [place] 08/15/2017, 11:14 AM

## 2017-08-15 NOTE — Progress Notes (Signed)
Physical Therapy Treatment Patient Details Name: Robyn Barton MRN: [id number] DOB: 1945-10-20 Today's Date: 08/15/2017    History of Present Illness Pt is a 72 y/o female admitted after falling at home in her kitchen injuring L hip and is now s/p L ANTERIOR APPROACH HEMI HIP ARTHROPLASTY.  PMH signficant for but not limited to: small cell lung cancer (remission after lobectomy, chemo/radiation), hiatal hernia, osteopenia, recurrent major depressive disorder, anxiety.     PT Comments    Pt progressing toward PT goals;  Continue to recommend SNF to maximize independence and safety   Follow Up Recommendations  SNF;Supervision/Assistance - 24 hour     Equipment Recommendations  None recommended by PT    Recommendations for Other Services       Precautions / Restrictions Precautions Precautions: Fall Restrictions Weight Bearing Restrictions: No LLE Weight Bearing: Weight bearing as tolerated    Mobility  Bed Mobility Overal bed mobility: Needs Assistance Bed Mobility: Supine to Sit     Supine to sit: Mod assist;Min assist     General bed mobility comments: multi-modal cues for technique, assist with trunk and LLE  Transfers Overall transfer level: Needs assistance Equipment used: Rolling walker (2 wheeled) Transfers: Sit to/from Stand Sit to Stand: Min guard         General transfer comment: initial cues for hand placmement  Ambulation/Gait Ambulation/Gait assistance: Min assist Gait Distance (Feet): 16 Feet Assistive device: Rolling walker (2 wheeled) Gait Pattern/deviations: Trunk flexed;Step-to pattern;Decreased step length - right;Decreased step length - left Gait velocity: Decreased   General Gait Details: verbal cues for sequencing adn RW safety, incr time needed, assist to balance when wt shifting   Stairs             Wheelchair Mobility    Modified Rankin (Stroke Patients Only)       Balance Overall balance assessment: Needs  assistance Sitting-balance support: Feet supported;No upper extremity supported Sitting balance-Leahy Scale: Fair     Standing balance support: Bilateral upper extremity supported;During functional activity Standing balance-Leahy Scale: Poor Standing balance comment: reliant on B UE support                            Cognition Arousal/Alertness: Awake/alert Behavior During Therapy: WFL for tasks assessed/performed;Anxious Overall Cognitive Status: Within Functional Limits for tasks assessed                                 General Comments: pt anxious regarding medical issues; emotional morning per NT      Exercises General Exercises - Lower Extremity Ankle Circles/Pumps: AROM;Both;10 reps Quad Sets: AROM;Both;10 reps Heel Slides: AAROM;AROM;Left;10 reps    General Comments        Pertinent Vitals/Pain Pain Assessment: Faces Faces Pain Scale: Hurts little more Pain Location: L hip during mobility Pain Descriptors / Indicators: Discomfort;Operative site guarding Pain Intervention(s): Limited activity within patient's tolerance;Monitored during session;Repositioned    Home Living                      Prior Function            PT Goals (current goals can now be found in the care plan section) Acute Rehab PT Goals Patient Stated Goal: to go to rehab PT Goal Formulation: With patient Time For Goal Achievement: 08/27/17 Potential to Achieve Goals: Good Progress towards PT goals: Progressing  toward goals    Frequency    Min 3X/week      PT Plan Current plan remains appropriate    Co-evaluation              AM-PAC PT "6 Clicks" Daily Activity  Outcome Measure  Difficulty turning over in bed (including adjusting bedclothes, sheets and blankets)?: Unable Difficulty moving from lying on back to sitting on the side of the bed? : Unable Difficulty sitting down on and standing up from a chair with arms (e.g., wheelchair,  bedside commode, etc,.)?: Unable Help needed moving to and from a bed to chair (including a wheelchair)?: A Little Help needed walking in hospital room?: A Little Help needed climbing 3-5 steps with a railing? : A Lot 6 Click Score: 11    End of Session Equipment Utilized During Treatment: Gait belt Activity Tolerance: Patient tolerated treatment well Patient left: with call bell/phone within reach;Other (comment)(on BSC, RN and NT aware) Nurse Communication: Mobility status PT Visit Diagnosis: Other abnormalities of gait and mobility (R26.89);Difficulty in walking, not elsewhere classified (R26.2);Pain Pain - Right/Left: Left Pain - part of body: Hip     Time: 1275-1700 PT Time Calculation (min) (ACUTE ONLY): 22 min  Charges:  $Gait Training: 8-22 mins                     Kenyon Ana, PT Pager: [phone] 08/15/2017    Kenyon Ana 08/15/2017, 1:33 PM

## 2017-08-15 NOTE — Discharge Summary (Signed)
Physician Discharge Summary  Robyn Barton:[id number] DOB: 1945-02-03 DOA: 08/11/2017  PCP: Shelda Pal, DO  Admit date: 08/11/2017 Discharge date: 08/15/2017  Time spent: 45 minutes  Recommendations for Outpatient Follow-up:  1. Suggest follow-up with Dr. Nicoletta Dress of [facility] gastroenterology at [facility] as patient has pyloric stenosis that was dilated 07/2017 and is still having symptoms 2. I will cc Dr. Lanell Persons of radiation oncology-some of these issues may be related to post radiation pyloric stenosis? 3. She will need labs in about 1 week 4. I would suggest Lovenox 4 to 6 weeks and then discontinue probably on 8/27 which would be 4 weeks for DVT prophylaxis in a patient with hip fracture 5. She has significant anxiety and needs to be followed by psychiatrist or Geri psychiatrist at facility   Discharge Diagnoses:  Principal Problem:   Hip fracture [facility]) Active Problems:   Small cell lung cancer, right upper lobe (HCC)   COPD (chronic obstructive pulmonary disease) ([place])   Leukocytosis   Anemia due to antineoplastic chemotherapy   Depression, recurrent (HCC)   CKD (chronic kidney disease), stage III ([place])   Discharge Condition: Improved  Diet recommendation: Soft diet  Filed Weights   08/11/17 1945  Weight: 50.8 kg (112 lb)    History of present illness:  72 year old female with history of small cell lung cancer in remission status post lobectomy, chemo/radiation who presented to the ED after a fall at home and noted to have a left hip fracture.  Patient seen in consultation by orthopedics and patient for left hip repair at [facility] 08/12/2017.    Hospital Course:  1 left subcapital femoral neck fracture status post surgery 08/12/2017 Secondary to mechanical fall patient  Chest x-ray with no acute abnormalities.  Patient with no chest pain.  EKG with no ischemic changes noted.  Continue expected postop care management of pain, DVT prophylaxis,  weightbearing status etc. to orthopedics Needed 1 U PRBC tx 8/1-postop hemoglobin 10 and will need to repeat check in 1 week DVT prophylaxis is Lovenox  2.  COPD Pulmicort nebs, Xopenex and Atrovent nebs to optimize her pulmonary status. Continue postop incentive spirometer.  3.  Leukocytosis Likely secondary to stress demargination secondary to problem #1.  Chest x-ray negative for any acute infiltrates.    UA and culture did not show any issue  4.  Chronic kidney disease stage II Stable. Needs OP labs  5.  Anemia H&H stable.  Follow postoperatively.  6.  Right upper lobe small cell lung cancer s/p XRT Status post lobectomy, chemotherapy and radiation.  In remission.  Patient with recent CT chest done on 08/01/2017 with no evidence of recurrent metastatic disease.  Outpatient follow-up with oncology. I have CCed Dr. Lanell Persons and will forward copy Dr. Mickeal Skinner who will be seeing her in August She probably has chronic nausea and vomiting and had pyloric stenosis status post 5//2019 at [facility] by Dr. Mitchell Heir--- I would suggest an outpatient follow-up in 1 to 2 weeks with this physician  7.  Depression Stable.  Continue Zoloft.  8. Retention Cannot void Foley placed and will need void trial at SNF and Urology OP appt if   9 history of tardive dyskinesia when on Reglan and other H2 meds-avoid if possible  Procedures:  Hip fracture   Consultations:  Orthopedics  Discharge Exam: Vitals:   08/15/17 0526 08/15/17 0823  BP: 128/80   Pulse: 85   Resp: 18   Temp: 97.7 F (36.5 C)  SpO2: 97% 98%    General: EOMI NCAT frail Cardiovascular: S1-S2 no murmur rub or gallop Respiratory: Clear Chest is clear Hip wound seems fine  Discharge Instructions   Discharge Instructions    Diet - low sodium heart healthy   Complete by:  As directed    Increase activity slowly   Complete by:  As directed    Weight bearing as tolerated   Complete by:  As directed      Allergies as  of 08/15/2017   No Known Allergies     Medication List    STOP taking these medications   atorvastatin 40 MG tablet Commonly known as:  LIPITOR   loperamide 2 MG capsule Commonly known as:  IMODIUM   Vitamin D 2000 units tablet     TAKE these medications   albuterol 108 (90 Base) MCG/ACT inhaler Commonly known as:  PROVENTIL HFA;VENTOLIN HFA Inhale 2 puffs into the lungs every 6 (six) hours as needed for wheezing or shortness of breath.   alendronate 70 MG tablet Commonly known as:  FOSAMAX Take 70 mg by mouth every Monday. Take with a full glass of water on an empty stomach.   calcium carbonate 500 MG chewable tablet Commonly known as:  TUMS - dosed in mg elemental calcium Chew 1 tablet daily as needed by mouth for indigestion or heartburn.   diphenhydramine-acetaminophen 25-500 MG Tabs tablet Commonly known as:  TYLENOL PM Take 1 tablet at bedtime as needed by mouth (sleep).   enoxaparin 40 MG/0.4ML injection Commonly known as:  LOVENOX Inject 0.4 mLs (40 mg total) into the skin daily.   fluticasone furoate-vilanterol 100-25 MCG/INH Aepb Commonly known as:  BREO ELLIPTA Inhale 1 puff into the lungs daily.   guaiFENesin 600 MG 12 hr tablet Commonly known as:  MUCINEX Take 600 mg by mouth 2 (two) times daily as needed for cough or to loosen phlegm.   loratadine 10 MG tablet Commonly known as:  CLARITIN Take 10 mg by mouth daily as needed (hay fever).   omeprazole 40 MG capsule Commonly known as:  PRILOSEC Take 40 mg by mouth daily.   ondansetron 8 MG tablet Commonly known as:  ZOFRAN TAKE 1 TABLET (8 MG TOTAL) BY MOUTH EVERY 8 (EIGHT) HOURS AS NEEDED FOR UP TO 7 DAYS FOR NAUSEA.   oxyCODONE-acetaminophen 5-325 MG tablet Commonly known as:  PERCOCET Take 1-2 tablets by mouth every 4 (four) hours as needed for severe pain.   polyethylene glycol packet Commonly known as:  MIRALAX / GLYCOLAX Take 17 g by mouth daily.   sertraline 100 MG tablet Commonly  known as:  ZOLOFT Take 1 tablet (100 mg total) by mouth daily.   tiotropium 18 MCG inhalation capsule Commonly known as:  SPIRIVA HANDIHALER Place 1 capsule (18 mcg total) into inhaler and inhale daily.            Discharge Care Instructions  (From admission, onward)        Start     Ordered   08/12/17 0000  Weight bearing as tolerated     08/12/17 1253     No Known Allergies Follow-up Information    Leandrew Koyanagi, MD In 2 weeks.   Specialty:  Orthopedic Surgery Why:  For suture removal, For wound re-check Contact information: [place] [address] [phone]            The results of significant diagnostics from this hospitalization (including imaging, microbiology, ancillary and laboratory) are  listed below for reference.    Significant Diagnostic Studies: Ct Chest W Contrast  Result Date: 08/01/2017 CLINICAL DATA:  Follow-up right lung cancer, status post right lung resection, chemotherapy and XRT complete. Shortness of breath. EXAM: CT CHEST WITH CONTRAST TECHNIQUE: Multidetector CT imaging of the chest was performed during intravenous contrast administration. CONTRAST:  74mL OMNIPAQUE IOHEXOL 300 MG/ML  SOLN COMPARISON:  05/02/2017 FINDINGS: Cardiovascular: Heart is normal in size.  No pericardial effusion. No evidence of thoracic aortic aneurysm. Atherosclerotic calcifications of the aortic arch. Coronary atherosclerosis of the LAD. Mediastinum/Nodes: No suspicious mediastinal lymphadenopathy. Visualized thyroid is unremarkable. Lungs/Pleura: Status post right middle lobectomy. Radiation changes in the superior segment right lower lobe and right paramediastinal region. Biapical pleural-parenchymal scarring. Mild paraseptal emphysematous changes, upper lung predominant. No suspicious pulmonary nodules. No focal consolidation. Trace right pleural fluid.  No pneumothorax. Upper Abdomen: Visualized upper abdomen is grossly unremarkable.  Musculoskeletal: Degenerative changes of the visualized thoracolumbar spine. IMPRESSION: Status post right middle lobectomy with radiation changes in the right hemithorax, as above. No evidence of recurrent metastatic disease. Aortic Atherosclerosis (ICD10-I70.0) and Emphysema (ICD10-J43.9). Electronically Signed   By: Julian Hy M.D.   On: 08/01/2017 15:39   Mr Jeri Cos LO Contrast  Result Date: 07/28/2017 CLINICAL DATA:  History of small cell lung cancer with metastatic disease. Whole-brain radiation 03/21/2017. Creatinine was obtained on site at [place] at [address] Results: Creatinine 1.4 mg/dL. EXAM: MRI HEAD WITHOUT AND WITH CONTRAST TECHNIQUE: Multiplanar, multiecho pulse sequences of the brain and surrounding structures were obtained without and with intravenous contrast. CONTRAST:  37mL MULTIHANCE GADOBENATE DIMEGLUMINE 529 MG/ML IV SOLN. Reduced dose contrast due to renal insufficiency. COMPARISON:  MRI head 02/17/2017 FINDINGS: Brain: Image quality degraded by mild motion. Progressive atrophy. Progressive diffuse white matter hyperintensity. Mild hyperintensity in the pons is stable. Findings most compatible with whole-brain radiation and chemotherapy. No enhancing metastatic deposits identified. No vasogenic edema. No acute infarct. No midline shift. Vascular: Normal arterial flow voids Skull and upper cervical spine: Negative Sinuses/Orbits: Paranasal sinuses clear.  Bilateral cataract surgery Other: None IMPRESSION: Negative for metastatic disease to the brain Progressive atrophy and chronic white matter changes related to whole-brain radiation and chemotherapy. Electronically Signed   By: Franchot Gallo M.D.   On: 07/28/2017 11:36   Pelvis Portable  Result Date: 08/12/2017 CLINICAL DATA:  Status post left hip replacement. EXAM: PORTABLE PELVIS 1-2 VIEWS COMPARISON:  Fluoroscopic images of same day. FINDINGS: The left femoral and acetabular components appear to be  well situated. No fracture or dislocation is noted. Expected postoperative changes are seen in the surrounding soft tissues. IMPRESSION: Status post left total hip arthroplasty. Electronically Signed   By: Marijo Conception, M.D.   On: 08/12/2017 14:11   Dg Chest Port 1 View  Result Date: 08/11/2017 CLINICAL DATA:  Preop, hip fracture EXAM: PORTABLE CHEST 1 VIEW COMPARISON:  CT chest dated 08/01/2017 FINDINGS: Postsurgical changes related to right middle lobectomy and radiation changes in the right hemithorax. Left lung is clear. No pleural effusion or pneumothorax. Rightward cardiomediastinal shift. IMPRESSION: Postsurgical and post radiation changes in the right hemithorax, better evaluated on recent CT. No evidence of acute cardiopulmonary disease. Electronically Signed   By: Julian Hy M.D.   On: 08/11/2017 21:32   Dg C-arm 1-60 Min  Result Date: 08/12/2017 CLINICAL DATA:  Left hip arthroplasty EXAM: OPERATIVE LEFT HIP WITH PELVIS; DG C-ARM 61-120 MIN COMPARISON:  None. FLUOROSCOPY TIME:  Radiation Exposure Index (as  provided by the fluoroscopic device): Not available If the device does not provide the exposure index: Fluoroscopy Time:  48 seconds Number of Acquired Images:  2 FINDINGS: Initial image demonstrates degenerative changes of the left hip joint. Left femoral fracture is noted as well. Subsequent left hip replacement is noted in satisfactory position. IMPRESSION: Status post left hip replacement Electronically Signed   By: Inez Catalina M.D.   On: 08/12/2017 12:58   Dg Hip Operative Unilat W Or W/o Pelvis Left  Result Date: 08/12/2017 CLINICAL DATA:  Left hip arthroplasty EXAM: OPERATIVE LEFT HIP WITH PELVIS; DG C-ARM 61-120 MIN COMPARISON:  None. FLUOROSCOPY TIME:  Radiation Exposure Index (as provided by the fluoroscopic device): Not available If the device does not provide the exposure index: Fluoroscopy Time:  48 seconds Number of Acquired Images:  2 FINDINGS: Initial image  demonstrates degenerative changes of the left hip joint. Left femoral fracture is noted as well. Subsequent left hip replacement is noted in satisfactory position. IMPRESSION: Status post left hip replacement Electronically Signed   By: Inez Catalina M.D.   On: 08/12/2017 12:58   Dg Hip Unilat With Pelvis 2-3 Views Left  Result Date: 08/11/2017 CLINICAL DATA:  [place].  Pain. EXAM: DG HIP (WITH OR WITHOUT PELVIS) 2-3V LEFT COMPARISON:  None. FINDINGS: There is an acute transverse displaced subcapital LEFT femoral neck fracture, with foreshortening. Pelvis is intact. Skeletal osteopenia is suspected. IMPRESSION: Acute LEFT subcapital femoral neck fracture with foreshortening. Electronically Signed   By: Staci Righter M.D.   On: 08/11/2017 20:53    Microbiology: Recent Results (from the past 240 hour(s))  Culture, Urine     Status: None   Collection Time: 08/13/17 12:20 AM  Result Value Ref Range Status   Specimen Description URINE, CATHETERIZED  Final   Special Requests NONE  Final   Culture   Final    NO GROWTH Performed at [facility], [address], [place], [place] [address]    Report Status 08/14/2017 FINAL  Final     Labs: Basic Metabolic Panel: Recent Labs  Lab 08/11/17 2128 08/12/17 0607 08/12/17 1950 08/13/17 0557 08/14/17 0503  NA 136 138  --  138 136  K 3.7 4.4  --  3.8 4.0  CL 101 103  --  105 101  CO2 26 29  --  26 25  GLUCOSE 118* 109*  --  89 113*  BUN 34* 29*  --  18 13  CREATININE 1.28* 1.20* 1.21* 1.12* 1.09*  CALCIUM 9.7 9.6  --  8.9 8.8*   Liver Function Tests: Recent Labs  Lab 08/11/17 2128 08/14/17 0503  AST 19 19  ALT 16 9  ALKPHOS 60 55  BILITOT 0.7 0.9  PROT 7.7 5.9*  ALBUMIN 4.0 2.8*   No results for input(s): LIPASE, AMYLASE in the last 168 hours. No results for input(s): AMMONIA in the last 168 hours. CBC: Recent Labs  Lab 08/11/17 2128 08/12/17 0607 08/12/17 1950 08/13/17 0557 08/14/17 0503 08/15/17 0918  WBC 10.9* 7.4 9.7  6.1 8.2 5.5  NEUTROABS 9.5*  --   --   --  7.2  --   HGB 11.6* 11.3* 8.8* 8.1* 7.7* 10.3*  HCT 35.1* 34.2* 27.5* 25.5* 23.4* 31.3*  MCV 88.4 89.5 92.9 92.7 89.7 90.7  PLT 227 209 147* 129* 136* 154   Cardiac Enzymes: No results for input(s): CKTOTAL, CKMB, CKMBINDEX, TROPONINI in the last 168 hours. BNP: BNP (last 3 results) No results for input(s): BNP  in the last 8760 hours.  ProBNP (last 3 results) No results for input(s): PROBNP in the last 8760 hours.  CBG: No results for input(s): GLUCAP in the last 168 hours.     Signed:  Nita Sells MD   [facility] 08/15/2017, 10:09 AM

## 2017-08-15 NOTE — Clinical Social Work Placement (Signed)
   CLINICAL SOCIAL WORK PLACEMENT  NOTE 08/15/17 - DISCHARGED TO [facility]  Date:  08/15/2017  Patient Details  Name: Robyn Barton MRN: [id number] Date of Birth: Nov 10, 1945  Clinical Social Work is seeking post-discharge placement for this patient at the [place] level of care (*CSW will initial, date and re-position this form in  chart as items are completed):  Yes   Patient/family provided with [facility] Work Department's list of facilities offering this level of care within the geographic area requested by the patient (or if unable, by the patient's family).  Yes   Patient/family informed of their freedom to choose among providers that offer the needed level of care, that participate in Medicare, Medicaid or managed care program needed by the patient, have an available bed and are willing to accept the patient.  Yes   Patient/family informed of [facility]'s ownership interest in [facility] and [facility], as well as of the fact that they are under no obligation to receive care at these facilities.  PASRR submitted to EDS on       PASRR number received on       Existing PASRR number confirmed on 08/13/17     FL2 transmitted to all facilities in geographic area requested by pt/family on 08/13/17     FL2 transmitted to all facilities within larger geographic area on       Patient informed that his/her managed care company has contracts with or will negotiate with certain facilities, including the following:        Yes   Patient/family informed of bed offers received.  Patient chooses bed at [facility]     Physician recommends and patient chooses bed at      Patient to be transferred to [facility] on 08/15/17.  Patient to be transferred to facility by Ambulance     Patient family notified on 08/15/17 of transfer.  Name of family member notified:  Daughter  [name] by phone [phone])      PHYSICIAN       Additional Comment:    _______________________________________________ Sable Feil, LCSW 08/15/2017, 4:25 PM

## 2017-08-15 NOTE — Clinical Social Work Note (Signed)
CSW advised that patient will be ready for discharge today if her hemoglobin is good. Call made to Abigail Butts, admissions director at  [facility] and they have received authorization and can take patient today or Saturday.  Daughter Arrie Aran [phone]) contacted and updated regarding possible d/c today to [facility]. CSW will continue to follow and provide SW intervention services as needed.  Dajuan Turnley Givens, MSW, LCSW Licensed Clinical Social Worker [place] [phone]

## 2017-08-15 NOTE — Progress Notes (Signed)
Called report to accepting nurse at [facility]

## 2017-08-18 ENCOUNTER — Telehealth: Payer: Self-pay

## 2017-08-18 NOTE — Telephone Encounter (Addendum)
TCM call placed to patient. Left message to call if there is anything this office can do for her and we would schedule her with Dr. Nani Ravens.

## 2017-08-25 ENCOUNTER — Telehealth: Payer: Self-pay | Admitting: Internal Medicine

## 2017-08-25 NOTE — Telephone Encounter (Signed)
Patient called to cancel °

## 2017-08-27 ENCOUNTER — Ambulatory Visit: Payer: Self-pay | Admitting: Internal Medicine

## 2017-08-28 ENCOUNTER — Inpatient Hospital Stay (INDEPENDENT_AMBULATORY_CARE_PROVIDER_SITE_OTHER): Payer: Medicare Other | Admitting: Orthopaedic Surgery

## 2017-09-03 ENCOUNTER — Ambulatory Visit (INDEPENDENT_AMBULATORY_CARE_PROVIDER_SITE_OTHER): Payer: Medicare Other

## 2017-09-03 ENCOUNTER — Encounter (INDEPENDENT_AMBULATORY_CARE_PROVIDER_SITE_OTHER): Payer: Self-pay | Admitting: Orthopaedic Surgery

## 2017-09-03 ENCOUNTER — Ambulatory Visit (INDEPENDENT_AMBULATORY_CARE_PROVIDER_SITE_OTHER): Payer: Medicare Other | Admitting: Orthopaedic Surgery

## 2017-09-03 VITALS — Ht 65.0 in | Wt 110.0 lb

## 2017-09-03 DIAGNOSIS — M25552 Pain in left hip: Secondary | ICD-10-CM

## 2017-09-03 DIAGNOSIS — S72002D Fracture of unspecified part of neck of left femur, subsequent encounter for closed fracture with routine healing: Secondary | ICD-10-CM

## 2017-09-03 NOTE — Progress Notes (Signed)
Post-Op Visit Note   Patient: Robyn Barton           Date of Birth: [birth year]           MRN: [id number] Visit Date: 09/03/2017 PCP: Shelda Pal, DO   Assessment & Plan:  Chief Complaint:  Chief Complaint  Patient presents with  . Left Hip - Routine Post Op   Visit Diagnoses:  1. Closed fracture of left hip with routine healing, subsequent encounter   2. Pain in left hip     Plan: Robyn Barton is 3 weeks status post left partial hip replacement for femoral neck fracture.  Her pain is well controlled.  Her surgical incision is healed.  No signs of infection.  Sutures were removed today.  Leg lengths are equal.  X-rays demonstrate stable hip replacement in good alignment.  From our standpoint everything looks good and she has been discharged from her SNF.  Continue with physical therapy for strengthening and mobilization.  She is having trouble using a regular rolling walker therefore I think she needs a Rollator.  I would like to recheck her in 4 weeks with standing AP pelvis and lateral hip on return.  Follow-Up Instructions: No follow-ups on file.   Orders:  Orders Placed This Encounter  Procedures  . XR HIP UNILAT W OR W/O PELVIS 2-3 VIEWS LEFT   No orders of the defined types were placed in this encounter.   Imaging: No results found.  PMFS History: Patient Active Problem List   Diagnosis Date Noted  . Hip fracture ([place]) 08/11/2017  . CKD (chronic kidney disease), stage III ([place]) 08/11/2017  . Paresthesia of both feet 04/17/2017  . Osteopenia 04/17/2017  . Depression, recurrent ([place]) 02/20/2017  . Anemia due to antineoplastic chemotherapy 12/18/2016  . Anxiety 11/26/2016  . Leukocytosis 11/24/2016  . Dyspnea 11/15/2016  . Goals of care, counseling/discussion 10/21/2016  . Encounter for antineoplastic chemotherapy 10/21/2016  . Mediastinal lymphadenopathy   . Small cell lung cancer, right upper lobe ([place]) 08/20/2016  . COPD (chronic obstructive pulmonary  disease) ([place]) 08/20/2016  . ARDS (adult respiratory distress syndrome) ([place]) 08/20/2016   Past Medical History:  Diagnosis Date  . Allergic rhinitis   . Anxiety   . Elevated TSH    pt denies  . Emphysema lung ([place])   . Enlarged lymph nodes    in chest  . GERD (gastroesophageal reflux disease)   . Goals of care, counseling/discussion 10/21/2016  . History of hiatal hernia   . History of radiation therapy 03/10/17- 03/21/17   Whole brain, prophylactic, radiation 25 Gy in 10 fractions  . History of radiation therapy 11/18/16- 01/16/17   59.4 Gy to lung in 33 fractions.  . Hyperlipidemia   . IFG (impaired fasting glucose)   . Osteopenia   . Recurrent major depressive disorder ([place])   . Sleep apnea    mild no cpap needed  . Small cell carcinoma of right lung ([place]) 2018   surgery right middle lobe removed   . Tobacco dependence   . Varicose veins of both lower extremities   . Vitamin D deficiency     Family History  Problem Relation Age of Onset  . Depression Mother   . Cancer Father   . COPD Father   . Heart disease Father   . High blood pressure Father   . High Cholesterol Father     Past Surgical History:  Procedure Laterality Date  . ABDOMINAL HYSTERECTOMY  1 ovary left  . ANTERIOR APPROACH HEMI HIP ARTHROPLASTY Left 08/12/2017   Procedure: ANTERIOR APPROACH HEMI HIP ARTHROPLASTY;  Surgeon: Leandrew Koyanagi, MD;  Location: [facility];  Service: Orthopedics;  Laterality: Left;  . ENDOBRONCHIAL ULTRASOUND Bilateral 10/15/2016   Procedure: ENDOBRONCHIAL ULTRASOUND;  Surgeon: Collene Gobble, MD;  Location: [facility];  Service: Cardiopulmonary;  Laterality: Bilateral;  . INCONTINENCE SURGERY     bladder sling  . right middle lobe lung surgery  06/26/2016   [facility]   Social History   Occupational History  . Not on file  Tobacco Use  . Smoking status: Former Smoker    Packs/day: 1.50    Years: 50.00    Pack years: 75.00    Types: Cigarettes    Last attempt to quit:  06/14/2016    Years since quitting: 1.2  . Smokeless tobacco: Never Used  Substance and Sexual Activity  . Alcohol use: Yes    Comment: rare  . Drug use: No  . Sexual activity: Never

## 2017-09-09 ENCOUNTER — Telehealth (INDEPENDENT_AMBULATORY_CARE_PROVIDER_SITE_OTHER): Payer: Self-pay | Admitting: Orthopaedic Surgery

## 2017-09-09 NOTE — Telephone Encounter (Signed)
Returned call to patient left message to return call to reschedule appointment [phone]

## 2017-09-12 ENCOUNTER — Telehealth: Payer: Self-pay | Admitting: Internal Medicine

## 2017-09-12 NOTE — Telephone Encounter (Signed)
Left message for pt regarding vm she left to r/s an appt.

## 2017-09-17 ENCOUNTER — Ambulatory Visit: Payer: Self-pay | Admitting: Internal Medicine

## 2017-09-18 ENCOUNTER — Ambulatory Visit (INDEPENDENT_AMBULATORY_CARE_PROVIDER_SITE_OTHER): Payer: Medicare Other | Admitting: Orthopaedic Surgery

## 2017-10-02 ENCOUNTER — Ambulatory Visit (INDEPENDENT_AMBULATORY_CARE_PROVIDER_SITE_OTHER): Payer: Medicare Other | Admitting: Physician Assistant

## 2017-10-02 ENCOUNTER — Ambulatory Visit (INDEPENDENT_AMBULATORY_CARE_PROVIDER_SITE_OTHER): Payer: Medicare Other

## 2017-10-02 ENCOUNTER — Encounter (INDEPENDENT_AMBULATORY_CARE_PROVIDER_SITE_OTHER): Payer: Self-pay | Admitting: Orthopaedic Surgery

## 2017-10-02 DIAGNOSIS — Z96642 Presence of left artificial hip joint: Secondary | ICD-10-CM

## 2017-10-02 NOTE — Progress Notes (Signed)
Post-Op Visit Note   Patient: Robyn Barton           Date of Birth: Aug 08, 1945           MRN: [id number] Visit Date: 10/02/2017 PCP: Shelda Pal, DO   Assessment & Plan:  Chief Complaint:  Chief Complaint  Patient presents with  . Left Hip - Pain   Visit Diagnoses:  1. History of hemiarthroplasty of left hip     Plan: Patient is a pleasant 72 year old female who presents to our clinic today 51 days status post left anterior approach hemi-hip arthroplasty from a femoral neck fracture, date of surgery 08/12/2017.  She has been working with home health physical therapy making great progress.  She is using a rolling walker without any issues.  Minimal pain.  Examination of the left hip reveals a well-healed surgical incision without evidence of infection or cellulitis.  She is able to fully hip flex.  At this point, I would like for the patient to continue working with home health physical therapy to work on strengthening.  She is still fairly weak from before surgery.  She will follow-up with [place] in 6 weeks time for recheck.  Follow-Up Instructions: Return in about 6 weeks (around 11/13/2017).   Orders:  Orders Placed This Encounter  Procedures  . XR HIP UNILAT W OR W/O PELVIS 2-3 VIEWS LEFT   No orders of the defined types were placed in this encounter.   Imaging: Xr Hip Unilat W Or W/o Pelvis 2-3 Views Left  Result Date: 10/02/2017 X-rays of the left hip reveals stable alignment of the prosthesis without evidence of subsidence or ostial lysis   PMFS History: Patient Active Problem List   Diagnosis Date Noted  . Hip fracture ([place]) 08/11/2017  . CKD (chronic kidney disease), stage III ([place]) 08/11/2017  . Paresthesia of both feet 04/17/2017  . Osteopenia 04/17/2017  . Depression, recurrent ([place]) 02/20/2017  . Anemia due to antineoplastic chemotherapy 12/18/2016  . Anxiety 11/26/2016  . Leukocytosis 11/24/2016  . Dyspnea 11/15/2016  . Goals of care,  counseling/discussion 10/21/2016  . Encounter for antineoplastic chemotherapy 10/21/2016  . Mediastinal lymphadenopathy   . Small cell lung cancer, right upper lobe ([place]) 08/20/2016  . COPD (chronic obstructive pulmonary disease) ([place]) 08/20/2016  . ARDS (adult respiratory distress syndrome) ([place]) 08/20/2016   Past Medical History:  Diagnosis Date  . Allergic rhinitis   . Anxiety   . Elevated TSH    pt denies  . Emphysema lung ([place])   . Enlarged lymph nodes    in chest  . GERD (gastroesophageal reflux disease)   . Goals of care, counseling/discussion 10/21/2016  . History of hiatal hernia   . History of radiation therapy 03/10/17- 03/21/17   Whole brain, prophylactic, radiation 25 Gy in 10 fractions  . History of radiation therapy 11/18/16- 01/16/17   59.4 Gy to lung in 33 fractions.  . Hyperlipidemia   . IFG (impaired fasting glucose)   . Osteopenia   . Recurrent major depressive disorder ([place])   . Sleep apnea    mild no cpap needed  . Small cell carcinoma of right lung ([place]) 2018   surgery right middle lobe removed   . Tobacco dependence   . Varicose veins of both lower extremities   . Vitamin D deficiency     Family History  Problem Relation Age of Onset  . Depression Mother   . Cancer Father   . COPD Father   .  Heart disease Father   . High blood pressure Father   . High Cholesterol Father     Past Surgical History:  Procedure Laterality Date  . ABDOMINAL HYSTERECTOMY     1 ovary left  . ANTERIOR APPROACH HEMI HIP ARTHROPLASTY Left 08/12/2017   Procedure: ANTERIOR APPROACH HEMI HIP ARTHROPLASTY;  Surgeon: Leandrew Koyanagi, MD;  Location: [facility];  Service: Orthopedics;  Laterality: Left;  . ENDOBRONCHIAL ULTRASOUND Bilateral 10/15/2016   Procedure: ENDOBRONCHIAL ULTRASOUND;  Surgeon: Collene Gobble, MD;  Location: [facility];  Service: Cardiopulmonary;  Laterality: Bilateral;  . INCONTINENCE SURGERY     bladder sling  . right middle lobe lung surgery  06/26/2016    [facility]   Social History   Occupational History  . Not on file  Tobacco Use  . Smoking status: Former Smoker    Packs/day: 1.50    Years: 50.00    Pack years: 75.00    Types: Cigarettes    Last attempt to quit: 06/14/2016    Years since quitting: 1.3  . Smokeless tobacco: Never Used  Substance and Sexual Activity  . Alcohol use: Yes    Comment: rare  . Drug use: No  . Sexual activity: Never

## 2017-10-06 ENCOUNTER — Telehealth (INDEPENDENT_AMBULATORY_CARE_PROVIDER_SITE_OTHER): Payer: Self-pay | Admitting: Orthopaedic Surgery

## 2017-10-06 NOTE — Telephone Encounter (Signed)
[name] DDS  [name]  [phone]   Robyn Barton office called needed to see if patient needed antibiotic for dental appointment  today. Patient had surgery on the 30th of July anterior approach hemi hip arthroplasty. I spoke with [name] she will send a form over for Dr.Xu to sign for patient. I did advise [name] is currently out of the office due to surgery.

## 2017-10-06 NOTE — Telephone Encounter (Signed)
2 g amoxicillin 30 mins before procedure

## 2017-10-06 NOTE — Telephone Encounter (Signed)
See message below °

## 2017-10-07 ENCOUNTER — Encounter: Payer: Self-pay | Admitting: Internal Medicine

## 2017-10-07 ENCOUNTER — Telehealth: Payer: Self-pay

## 2017-10-07 ENCOUNTER — Inpatient Hospital Stay: Payer: Medicare Other | Attending: Radiation Oncology | Admitting: Internal Medicine

## 2017-10-07 ENCOUNTER — Other Ambulatory Visit: Payer: Self-pay

## 2017-10-07 VITALS — BP 109/61 | HR 82 | Temp 97.8°F | Resp 18 | Ht 65.0 in | Wt 109.7 lb

## 2017-10-07 DIAGNOSIS — Z9221 Personal history of antineoplastic chemotherapy: Secondary | ICD-10-CM | POA: Insufficient documentation

## 2017-10-07 DIAGNOSIS — C3411 Malignant neoplasm of upper lobe, right bronchus or lung: Secondary | ICD-10-CM

## 2017-10-07 DIAGNOSIS — G255 Other chorea: Secondary | ICD-10-CM | POA: Diagnosis present

## 2017-10-07 DIAGNOSIS — G629 Polyneuropathy, unspecified: Secondary | ICD-10-CM | POA: Diagnosis not present

## 2017-10-07 DIAGNOSIS — Z923 Personal history of irradiation: Secondary | ICD-10-CM | POA: Insufficient documentation

## 2017-10-07 NOTE — Progress Notes (Signed)
[facility] at [address] [place], [place] [address] [phone]   New Patient Evaluation  Date of Service: 10/07/17 Patient Name: Robyn Barton Patient MRN: [id number] Patient DOB: July 29, 1945 Provider: Ventura Sellers, MD  Identifying Statement:  Robyn Barton is a 72 y.o. female with involuntary movements who presents for initial consultation and evaluation regarding cancer associated neurologic deficits.    Referring Provider: Shelda Pal, DO [address] [place], [place] [address]  Primary Cancer: Small Cell Lung [name])  Oncologic History: 1) right upper lobectomy on 06/26/2016 and the final pathology was consistent with small cell lung cancer measuring 1.6 cm in size. 2) systemic chemotherapy with cisplatin 60 MG/M2 on day 1 and etoposide 120 MG/M2 on days 1, 2 and 3 every 3 weeks.  Status post 4 cycles.  This was concurrent with radiation. 3) prophylactic cranial irradiation under the care of Dr. Isidore Moos.  CURRENT THERAPY: Observation.  History of Present Illness: The patient's records from the referring physician were obtained and reviewed and the patient interviewed to confirm this HPI.  Robyn Barton presents to discuss recent involuntary movements.  She describes occasional jerking of her head, in either direction, without pain or sustained contraction.  This has been occurring for several months, it may be worsening or occurring more frequently.  She does not live with anyone and so it is unclear if family/friends have noticed any of the symptoms.  She also describes numbness, tingling in the bottoms of her feet and her fingertips.  This is also new and did not occur during or soon after chemotherapy, which was completed in January.  Additionally she describes frequent nausea and generalized fatigue.  Recently had a fall and hip fracture which was repaired.  Did undergo PCI with Dr. Isidore Moos last year.     Medications: Current Outpatient Medications on File Prior to Visit  Medication Sig Dispense Refill  . albuterol (PROVENTIL HFA;VENTOLIN HFA) 108 (90 Base) MCG/ACT inhaler Inhale 2 puffs into the lungs every 6 (six) hours as needed for wheezing or shortness of breath. 1 Inhaler 3  . alendronate (FOSAMAX) 70 MG tablet Take 70 mg by mouth every Monday. Take with a full glass of water on an empty stomach.     . calcium carbonate (TUMS - DOSED IN MG ELEMENTAL CALCIUM) 500 MG chewable tablet Chew 1 tablet daily as needed by mouth for indigestion or heartburn.    . diphenhydramine-acetaminophen (TYLENOL PM) 25-500 MG TABS tablet Take 1 tablet at bedtime as needed by mouth (sleep).    . fluticasone furoate-vilanterol (BREO ELLIPTA) 100-25 MCG/INH AEPB Inhale 1 puff into the lungs daily. 1 each 3  . guaiFENesin (MUCINEX) 600 MG 12 hr tablet Take 600 mg by mouth 2 (two) times daily as needed for cough or to loosen phlegm.    . loratadine (CLARITIN) 10 MG tablet Take 10 mg by mouth daily as needed (hay fever).    Mar[place] Kitchen omeprazole (PRILOSEC) 40 MG capsule Take 40 mg by mouth daily.   3  . ondansetron (ZOFRAN) 8 MG tablet TAKE 1 TABLET (8 MG TOTAL) BY MOUTH EVERY 8 (EIGHT) HOURS AS NEEDED FOR UP TO 7 DAYS FOR NAUSEA.  2  . oxyCODONE-acetaminophen (PERCOCET) 5-325 MG tablet Take 1-2 tablets by mouth every 4 (four) hours as needed for severe pain. 30 tablet 0  . polyethylene glycol (MIRALAX / GLYCOLAX) packet Take 17 g by mouth daily.    Mar[place] Kitchen  sertraline (ZOLOFT) 100 MG tablet Take 1 tablet (100 mg total) by mouth daily. 90 tablet 3  . tiotropium (SPIRIVA HANDIHALER) 18 MCG inhalation capsule Place 1 capsule (18 mcg total) into inhaler and inhale daily. 30 capsule 3   No current facility-administered medications on file prior to visit.     Allergies: No Known Allergies Past Medical History:  Past Medical History:  Diagnosis Date  . Allergic rhinitis   . Anxiety   . Elevated TSH    pt denies  . Emphysema  lung ([place])   . Enlarged lymph nodes    in chest  . GERD (gastroesophageal reflux disease)   . Goals of care, counseling/discussion 10/21/2016  . History of hiatal hernia   . History of radiation therapy 03/10/17- 03/21/17   Whole brain, prophylactic, radiation 25 Gy in 10 fractions  . History of radiation therapy 11/18/16- 01/16/17   59.4 Gy to lung in 33 fractions.  . Hyperlipidemia   . IFG (impaired fasting glucose)   . Osteopenia   . Recurrent major depressive disorder ([place])   . Sleep apnea    mild no cpap needed  . Small cell carcinoma of right lung ([place]) 2018   surgery right middle lobe removed   . Tobacco dependence   . Varicose veins of both lower extremities   . Vitamin D deficiency    Past Surgical History:  Past Surgical History:  Procedure Laterality Date  . ABDOMINAL HYSTERECTOMY     1 ovary left  . ANTERIOR APPROACH HEMI HIP ARTHROPLASTY Left 08/12/2017   Procedure: ANTERIOR APPROACH HEMI HIP ARTHROPLASTY;  Surgeon: Leandrew Koyanagi, MD;  Location: [facility];  Service: Orthopedics;  Laterality: Left;  . ENDOBRONCHIAL ULTRASOUND Bilateral 10/15/2016   Procedure: ENDOBRONCHIAL ULTRASOUND;  Surgeon: Collene Gobble, MD;  [facility];  Service: Cardiopulmonary;  Laterality: Bilateral;  . INCONTINENCE SURGERY     bladder sling  . right middle lobe lung surgery  06/26/2016   [facility]   Social History:  Social History   Socioeconomic History  . Marital status: Divorced    Spouse name: Not on file  . Number of children: Not on file  . Years of education: Not on file  . Highest education level: Not on file  Occupational History  . Not on file  Social Needs  . Financial resource strain: Not on file  . Food insecurity:    Worry: Not on file    Inability: Not on file  . Transportation needs:    Medical: Not on file    Non-medical: Not on file  Tobacco Use  . Smoking status: Former Smoker    Packs/day: 1.50    Years: 50.00    Pack years: 75.00    Types:  Cigarettes    Last attempt to quit: 06/14/2016    Years since quitting: 1.3  . Smokeless tobacco: Never Used  Substance and Sexual Activity  . Alcohol use: Yes    Comment: rare  . Drug use: No  . Sexual activity: Never  Lifestyle  . Physical activity:    Days per week: Not on file    Minutes per session: Not on file  . Stress: Not on file  Relationships  . Social connections:    Talks on phone: Not on file    Gets together: Not on file    Attends religious service: Not on file    Active member of club or organization: Not on file    Attends meetings of clubs  or organizations: Not on file    Relationship status: Not on file  . Intimate partner violence:    Fear of current or ex partner: Not on file    Emotionally abused: Not on file    Physically abused: Not on file    Forced sexual activity: Not on file  Other Topics Concern  . Not on file  Social History Narrative  . Not on file   Family History:  Family History  Problem Relation Age of Onset  . Depression Mother   . Cancer Father   . COPD Father   . Heart disease Father   . High blood pressure Father   . High Cholesterol Father     Review of Systems: Constitutional: Denies fevers, chills or abnormal weight loss Eyes: Denies blurriness of vision Ears, nose, mouth, throat, and face: Denies mucositis or sore throat Respiratory: Denies cough, dyspnea or wheezes Cardiovascular: Denies palpitation, chest discomfort or lower extremity swelling Gastrointestinal: ++nausea GU: Denies dysuria or incontinence Skin: Denies abnormal skin rashes Neurological: Per HPI Musculoskeletal: Denies joint pain, back or neck discomfort. No decrease in ROM Behavioral/Psych: +anxiety   Physical Exam: Vitals:   10/07/17 1223  BP: 109/61  Pulse: 82  Resp: 18  Temp: 97.8 F (36.6 C)  SpO2: 100%   KPS: 80. General: Alert, cooperative, pleasant, in no acute distress Head: Craniotomy scar noted, dry and intact. EENT: No  conjunctival injection or scleral icterus. Oral mucosa moist Lungs: Resp effort normal Cardiac: Regular rate and rhythm Abdomen: Soft, non-distended abdomen Skin: No rashes cyanosis or petechiae. Extremities: No clubbing or edema  Neurologic Exam: Mental Status: Awake, alert, attentive to examiner. Oriented to self and environment. Language is fluent with intact comprehension.  Cranial Nerves: Visual acuity is grossly normal. Visual fields are full. Extra-ocular movements intact. No ptosis. Face is symmetric, tongue midline. Motor: Noted dancelike involuntary movements affecting head/face, arms, legs and trunk c/w chorea. Tone normal and bulk is diminished throughout. Power is full in both arms and legs. Reflexes are symmetric, no pathologic reflexes present. Intact finger to nose bilaterally Sensory: Intact to light touch and temperature Gait: Normal, independent  Labs: I have reviewed the data as listed    Component Value Date/Time   NA 136 08/14/2017 0503   NA 135 (L) 01/10/2017 1045   K 4.0 08/14/2017 0503   K 4.9 01/10/2017 1045   CL 101 08/14/2017 0503   CO2 25 08/14/2017 0503   CO2 20 (L) 01/10/2017 1045   GLUCOSE 113 (H) 08/14/2017 0503   GLUCOSE 103 01/10/2017 1045   BUN 13 08/14/2017 0503   BUN 27.6 (H) 01/10/2017 1045   CREATININE 1.09 (H) 08/14/2017 0503   CREATININE 1.44 (H) 08/01/2017 1057   CREATININE 1.4 (H) 01/10/2017 1045   CALCIUM 8.8 (L) 08/14/2017 0503   CALCIUM 9.7 01/10/2017 1045   PROT 5.9 (L) 08/14/2017 0503   PROT 6.7 01/10/2017 1045   ALBUMIN 2.8 (L) 08/14/2017 0503   ALBUMIN 3.5 01/10/2017 1045   AST 19 08/14/2017 0503   AST 14 (L) 08/01/2017 1057   AST 10 01/10/2017 1045   ALT 9 08/14/2017 0503   ALT 11 08/01/2017 1057   ALT 11 01/10/2017 1045   ALKPHOS 55 08/14/2017 0503   ALKPHOS 77 01/10/2017 1045   BILITOT 0.9 08/14/2017 0503   BILITOT 0.3 08/01/2017 1057   BILITOT 0.34 01/10/2017 1045   GFRNONAA 49 (L) 08/14/2017 0503   GFRNONAA 35  (L) 08/01/2017 1057   GFRAA 57 (L)  08/14/2017 0503   GFRAA 40 (L) 08/01/2017 1057   Lab Results  Component Value Date   WBC 5.5 08/15/2017   NEUTROABS 7.2 08/14/2017   HGB 10.3 (L) 08/15/2017   HCT 31.3 (L) 08/15/2017   MCV 90.7 08/15/2017   PLT 154 08/15/2017    Imaging:  CLINICAL DATA:  History of small cell lung cancer with metastatic disease. Whole-brain radiation 03/21/2017.  Creatinine was obtained on site at [facility] at [address]  Results: Creatinine 1.4 mg/dL.  EXAM: MRI HEAD WITHOUT AND WITH CONTRAST  TECHNIQUE: Multiplanar, multiecho pulse sequences of the brain and surrounding structures were obtained without and with intravenous contrast.  CONTRAST:  71m MULTIHANCE GADOBENATE DIMEGLUMINE 529 MG/ML IV SOLN. Reduced dose contrast due to renal insufficiency.  COMPARISON:  MRI head 02/17/2017  FINDINGS: Brain: Image quality degraded by mild motion. Progressive atrophy. Progressive diffuse white matter hyperintensity. Mild hyperintensity in the pons is stable. Findings most compatible with whole-brain radiation and chemotherapy.  No enhancing metastatic deposits identified. No vasogenic edema. No acute infarct. No midline shift.  Vascular: Normal arterial flow voids  Skull and upper cervical spine: Negative  Sinuses/Orbits: Paranasal sinuses clear.  Bilateral cataract surgery  Other: None  IMPRESSION: Negative for metastatic disease to the brain  Progressive atrophy and chronic white matter changes related to whole-brain radiation and chemotherapy.   Electronically Signed   By: CFranchot GalloM.D.   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  There is no sign of essential or dystonic tremor.  At this time there is no significant impact on functional status although that can change quickly with further progression.  We  have asked her to reach out to family and friends to corroborate that there has been a change- she did not at first acknowledge the movements, aside from the head jerking until she was asked to look carefully in the mirror.      Although the differential is broad, her diagnosis of small cell lung cancer and concurrent neuropathic symptoms, as well as their (suspected) progressive nature raises concern for paraneoplastic syndrome such as anti-CRMP5, anti-NMDA, anti-Hu, anti-Yo, anti-LGI1, anti-CASPR2, anti-GAD65.    We will start by checking serum paraneoplastic panel, along with ESR, CRP, serum copper.  We spent twenty additional minutes teaching regarding the natural history, biology, and historical experience in the treatment of neurologic complications of cancer. We also provided teaching sheets for the patient to take home as an additional resource.  We appreciate the opportunity to participate in the care of LArlina Barton  She should return to clinic in 1 month for re-evaluation.  All questions were answered. The patient knows to call the clinic with any problems, questions or concerns. No barriers to learning were detected.  The total time spent in the encounter was 40 minutes and more than 50% was on counseling and review of test results   ZVentura Sellers MD Medical Director of Neuro-Oncology [facility]09/24/19 1:05 PM

## 2017-10-07 NOTE — Telephone Encounter (Signed)
Printed avs and calender of upcoming appointment. Per 9/24 los

## 2017-10-07 NOTE — Telephone Encounter (Signed)
FAXED BACK

## 2017-10-09 ENCOUNTER — Other Ambulatory Visit: Payer: Self-pay | Admitting: *Deleted

## 2017-10-09 NOTE — Progress Notes (Signed)
.  incomplete

## 2017-10-13 ENCOUNTER — Other Ambulatory Visit: Payer: Self-pay | Admitting: *Deleted

## 2017-10-14 ENCOUNTER — Telehealth: Payer: Self-pay | Admitting: Medical Oncology

## 2017-10-14 ENCOUNTER — Other Ambulatory Visit: Payer: Self-pay | Admitting: Medical Oncology

## 2017-10-14 ENCOUNTER — Other Ambulatory Visit: Payer: Self-pay | Admitting: *Deleted

## 2017-10-14 NOTE — Telephone Encounter (Signed)
Lab/Scan-Pt asking to have labs and scan done on 10/21 at [facility]. I told her not at this time because  she has special labs per  Dr Reche Dixon that need to be drawn here.

## 2017-10-15 ENCOUNTER — Other Ambulatory Visit: Payer: Self-pay | Admitting: Internal Medicine

## 2017-10-15 ENCOUNTER — Telehealth: Payer: Self-pay | Admitting: Medical Oncology

## 2017-10-15 ENCOUNTER — Telehealth: Payer: Self-pay

## 2017-10-15 DIAGNOSIS — G255 Other chorea: Secondary | ICD-10-CM

## 2017-10-15 NOTE — Telephone Encounter (Signed)
Copied from [place] [phone]. Topic: General - Other >> Oct 15, 2017  2:18 PM Janace Aris A wrote: Reason for CRM: patient called in wanting [place] to remove her PCP from her chart, says her PCP is no longer with [facility].

## 2017-10-15 NOTE — Telephone Encounter (Signed)
Pt wants her blood type . I told her the chart notes her blood type as 'B positive" in July 2019.

## 2017-10-16 ENCOUNTER — Other Ambulatory Visit: Payer: Self-pay | Admitting: *Deleted

## 2017-10-16 DIAGNOSIS — G255 Other chorea: Secondary | ICD-10-CM

## 2017-10-16 NOTE — Progress Notes (Signed)
Pending approval to proceed with scheduling of labs requested by Dr Mickeal Skinner.    Per Felizardo Hoffmann: Lab Supervisor:   Dr Mickeal Skinner and Maudie Mercury, I think we are ready to send out the paraneoplastic panel you requested. It will be sent to [facility] by [employer]. We do not currently have an interface with [facility] because our reference lab is [facility]. Results will be faxed to the lab and a copy delivered to Dr. Mickeal Skinner. The lab will also give a copy of the results to HIM and that copy will be scanned into the patient's chart. Please let me know when you schedule your first patient. If you can provide the patient's name, MRN and appointment information, I will pass it to the techs so they can watch for it. Please let me know if I can help in any other capacity. Thank you, Diane Felizardo Hoffmann, MT(ASCP) [facility] Supervisor   Labs scheduled for patient

## 2017-10-20 ENCOUNTER — Other Ambulatory Visit: Payer: Self-pay | Admitting: *Deleted

## 2017-10-20 DIAGNOSIS — G255 Other chorea: Secondary | ICD-10-CM

## 2017-10-21 ENCOUNTER — Inpatient Hospital Stay: Payer: Medicare Other | Attending: Radiation Oncology

## 2017-10-21 DIAGNOSIS — G255 Other chorea: Secondary | ICD-10-CM | POA: Diagnosis present

## 2017-10-21 DIAGNOSIS — Z9221 Personal history of antineoplastic chemotherapy: Secondary | ICD-10-CM | POA: Insufficient documentation

## 2017-10-21 DIAGNOSIS — Z79899 Other long term (current) drug therapy: Secondary | ICD-10-CM | POA: Diagnosis not present

## 2017-10-21 DIAGNOSIS — R531 Weakness: Secondary | ICD-10-CM | POA: Insufficient documentation

## 2017-10-21 DIAGNOSIS — C3411 Malignant neoplasm of upper lobe, right bronchus or lung: Secondary | ICD-10-CM | POA: Diagnosis present

## 2017-10-21 DIAGNOSIS — Z923 Personal history of irradiation: Secondary | ICD-10-CM | POA: Diagnosis not present

## 2017-10-21 DIAGNOSIS — Z87891 Personal history of nicotine dependence: Secondary | ICD-10-CM | POA: Diagnosis not present

## 2017-10-21 DIAGNOSIS — Z8249 Family history of ischemic heart disease and other diseases of the circulatory system: Secondary | ICD-10-CM | POA: Insufficient documentation

## 2017-10-21 DIAGNOSIS — Z96642 Presence of left artificial hip joint: Secondary | ICD-10-CM | POA: Diagnosis not present

## 2017-10-21 DIAGNOSIS — R5383 Other fatigue: Secondary | ICD-10-CM | POA: Diagnosis not present

## 2017-10-21 LAB — CBC WITH DIFFERENTIAL (CANCER CENTER ONLY)
Abs Immature Granulocytes: 0.02 10*3/uL (ref 0.00–0.07)
BASOS ABS: 0 10*3/uL (ref 0.0–0.1)
BASOS PCT: 0 %
EOS ABS: 0.1 10*3/uL (ref 0.0–0.5)
EOS PCT: 2 %
HEMATOCRIT: 37.2 % (ref 36.0–46.0)
Hemoglobin: 12.2 g/dL (ref 12.0–15.0)
IMMATURE GRANULOCYTES: 0 %
LYMPHS ABS: 0.9 10*3/uL (ref 0.7–4.0)
Lymphocytes Relative: 14 %
MCH: 29.1 pg (ref 26.0–34.0)
MCHC: 32.8 g/dL (ref 30.0–36.0)
MCV: 88.8 fL (ref 80.0–100.0)
Monocytes Absolute: 0.4 10*3/uL (ref 0.1–1.0)
Monocytes Relative: 7 %
NEUTROS PCT: 77 %
NRBC: 0 % (ref 0.0–0.2)
Neutro Abs: 5.1 10*3/uL (ref 1.7–7.7)
PLATELETS: 236 10*3/uL (ref 150–400)
RBC: 4.19 MIL/uL (ref 3.87–5.11)
RDW: 15.3 % (ref 11.5–15.5)
WBC: 6.6 10*3/uL (ref 4.0–10.5)

## 2017-10-21 LAB — CMP (CANCER CENTER ONLY)
ALK PHOS: 83 U/L (ref 38–126)
ALT: 10 U/L (ref 0–44)
AST: 14 U/L — ABNORMAL LOW (ref 15–41)
Albumin: 4 g/dL (ref 3.5–5.0)
Anion gap: 9 (ref 5–15)
BUN: 22 mg/dL (ref 8–23)
CALCIUM: 10.4 mg/dL — AB (ref 8.9–10.3)
CO2: 29 mmol/L (ref 22–32)
CREATININE: 1.11 mg/dL — AB (ref 0.44–1.00)
Chloride: 103 mmol/L (ref 98–111)
GFR, EST AFRICAN AMERICAN: 55 mL/min — AB (ref >60)
GFR, Estimated: 48 mL/min — ABNORMAL LOW (ref >60)
Glucose, Bld: 120 mg/dL — ABNORMAL HIGH (ref 70–99)
Potassium: 3.6 mmol/L (ref 3.5–5.1)
SODIUM: 141 mmol/L (ref 135–145)
TOTAL PROTEIN: 7.8 g/dL (ref 6.5–8.1)
Total Bilirubin: 0.3 mg/dL (ref 0.3–1.2)

## 2017-10-21 LAB — SEDIMENTATION RATE: SED RATE: 65 mm/h — AB (ref 0–22)

## 2017-10-21 LAB — C-REACTIVE PROTEIN: CRP: 1.1 mg/dL — ABNORMAL HIGH (ref <1.0)

## 2017-10-21 LAB — MAGNESIUM: MAGNESIUM: 1.9 mg/dL (ref 1.7–2.4)

## 2017-10-23 LAB — COPPER, SERUM: COPPER: 127 ug/dL (ref 72–166)

## 2017-11-03 ENCOUNTER — Ambulatory Visit (HOSPITAL_COMMUNITY)
Admission: RE | Admit: 2017-11-03 | Discharge: 2017-11-03 | Disposition: A | Discharge status: Home / Self Care | Payer: Medicare Other | Source: Ambulatory Visit | Attending: Internal Medicine | Admitting: Internal Medicine

## 2017-11-03 ENCOUNTER — Other Ambulatory Visit: Payer: Self-pay

## 2017-11-03 DIAGNOSIS — J439 Emphysema, unspecified: Secondary | ICD-10-CM | POA: Diagnosis not present

## 2017-11-03 DIAGNOSIS — I2510 Atherosclerotic heart disease of native coronary artery without angina pectoris: Secondary | ICD-10-CM | POA: Diagnosis not present

## 2017-11-03 DIAGNOSIS — C3411 Malignant neoplasm of upper lobe, right bronchus or lung: Secondary | ICD-10-CM

## 2017-11-03 DIAGNOSIS — I700 Atherosclerosis of aorta: Secondary | ICD-10-CM | POA: Diagnosis not present

## 2017-11-03 MED ORDER — IOHEXOL 300 MG/ML  SOLN
75.0000 mL | Freq: Once | INTRAMUSCULAR | Status: AC | PRN
  Administered 2017-11-03: 75 mL via INTRAVENOUS

## 2017-11-03 MED ORDER — SODIUM CHLORIDE 0.9 % IJ SOLN
INTRAMUSCULAR | Status: AC
  Filled 2017-11-03: qty 50

## 2017-11-04 ENCOUNTER — Ambulatory Visit: Payer: Self-pay | Admitting: Internal Medicine

## 2017-11-05 ENCOUNTER — Encounter: Payer: Self-pay | Admitting: Internal Medicine

## 2017-11-05 ENCOUNTER — Inpatient Hospital Stay (HOSPITAL_BASED_OUTPATIENT_CLINIC_OR_DEPARTMENT_OTHER): Payer: Medicare Other | Admitting: Internal Medicine

## 2017-11-05 VITALS — BP 109/88 | HR 89 | Temp 97.8°F | Resp 18 | Ht 65.0 in | Wt 111.8 lb

## 2017-11-05 DIAGNOSIS — G255 Other chorea: Secondary | ICD-10-CM | POA: Diagnosis not present

## 2017-11-05 DIAGNOSIS — C3411 Malignant neoplasm of upper lobe, right bronchus or lung: Secondary | ICD-10-CM | POA: Diagnosis not present

## 2017-11-05 DIAGNOSIS — Z9221 Personal history of antineoplastic chemotherapy: Secondary | ICD-10-CM

## 2017-11-05 DIAGNOSIS — Z96642 Presence of left artificial hip joint: Secondary | ICD-10-CM

## 2017-11-05 DIAGNOSIS — Z923 Personal history of irradiation: Secondary | ICD-10-CM

## 2017-11-05 DIAGNOSIS — Z79899 Other long term (current) drug therapy: Secondary | ICD-10-CM

## 2017-11-05 DIAGNOSIS — R5383 Other fatigue: Secondary | ICD-10-CM | POA: Diagnosis not present

## 2017-11-05 DIAGNOSIS — R531 Weakness: Secondary | ICD-10-CM | POA: Diagnosis not present

## 2017-11-05 NOTE — Progress Notes (Signed)
[place] [phone]   [phone]  OFFICE PROGRESS NOTE  [name], MD [address] [place] [address]  DIAGNOSIS: Recurrent small cell lung cancer initially diagnosed as Limited stage (T1a, N0, M0) small cell lung cancer diagnosed in June 2018.  PRIOR THERAPY:  1) right upper lobectomy on 06/26/2016 and the final pathology was consistent with small cell lung cancer measuring 1.6 cm in size. 2) systemic chemotherapy with cisplatin 60 MG/M2 on day 1 and etoposide 120 MG/M2 on days 1, 2 and 3 every 3 weeks.  Status post 4 cycles.  This was concurrent with radiation. 3) prophylactic cranial irradiation under the care of Dr. Isidore Moos.  CURRENT THERAPY: Observation.  INTERVAL HISTORY: Robyn Barton 72 y.o. female returns to the clinic today for follow-up visit.  The patient continues to complain of fatigue and weakness.  She was admitted to the hospital few months ago after a fall and she had left hip fracture.  She had total left hip replacement.  The patient denied having any current chest pain, shortness of breath, cough or hemoptysis.  She denied having any fever or chills.  She has no nausea, vomiting, diarrhea or constipation.  She had repeat CT scan of the chest performed recently and she is here for evaluation and discussion of her risk her results.  MEDICAL HISTORY: Past Medical History:  Diagnosis Date  . Allergic rhinitis   . Anxiety   . Elevated TSH    pt denies  . Emphysema lung ([place])   . Enlarged lymph nodes    in chest  . GERD (gastroesophageal reflux disease)   . Goals of care, counseling/discussion 10/21/2016  . History of hiatal hernia   . History of radiation therapy 03/10/17- 03/21/17   Whole brain, prophylactic, radiation 25 Gy in 10 fractions  . History of radiation therapy 11/18/16- 01/16/17   59.4 Gy to lung in 33 fractions.  . Hyperlipidemia   . IFG (impaired fasting glucose)   . Osteopenia   . Recurrent  major depressive disorder ([place])   . Sleep apnea    mild no cpap needed  . Small cell carcinoma of right lung ([place]) 2018   surgery right middle lobe removed   . Tobacco dependence   . Varicose veins of both lower extremities   . Vitamin D deficiency     ALLERGIES:  has No Known Allergies.  MEDICATIONS:  Current Outpatient Medications  Medication Sig Dispense Refill  . albuterol (PROVENTIL HFA;VENTOLIN HFA) 108 (90 Base) MCG/ACT inhaler Inhale 2 puffs into the lungs every 6 (six) hours as needed for wheezing or shortness of breath. 1 Inhaler 3  . alendronate (FOSAMAX) 70 MG tablet Take 70 mg by mouth every Monday. Take with a full glass of water on an empty stomach.     . calcium carbonate (TUMS - DOSED IN MG ELEMENTAL CALCIUM) 500 MG chewable tablet Chew 1 tablet daily as needed by mouth for indigestion or heartburn.    . diphenhydramine-acetaminophen (TYLENOL PM) 25-500 MG TABS tablet Take 1 tablet at bedtime as needed by mouth (sleep).    . fluticasone furoate-vilanterol (BREO ELLIPTA) 100-25 MCG/INH AEPB Inhale 1 puff into the lungs daily. 1 each 3  . guaiFENesin (MUCINEX) 600 MG 12 hr tablet Take 600 mg by mouth 2 (two) times daily as needed for cough or to loosen phlegm.    . loratadine (CLARITIN) 10 MG tablet Take 10 mg by mouth daily as  needed (hay fever).    Marland Kitchen omeprazole (PRILOSEC) 40 MG capsule Take 40 mg by mouth daily.   3  . ondansetron (ZOFRAN) 8 MG tablet TAKE 1 TABLET (8 MG TOTAL) BY MOUTH EVERY 8 (EIGHT) HOURS AS NEEDED FOR UP TO 7 DAYS FOR NAUSEA.  2  . oxyCODONE-acetaminophen (PERCOCET) 5-325 MG tablet Take 1-2 tablets by mouth every 4 (four) hours as needed for severe pain. 30 tablet 0  . polyethylene glycol (MIRALAX / GLYCOLAX) packet Take 17 g by mouth daily.    . sertraline (ZOLOFT) 100 MG tablet Take 1 tablet (100 mg total) by mouth daily. 90 tablet 3  . tiotropium (SPIRIVA HANDIHALER) 18 MCG inhalation capsule Place 1 capsule (18 mcg total) into inhaler and inhale  daily. 30 capsule 3   No current facility-administered medications for this visit.     SURGICAL HISTORY:  Past Surgical History:  Procedure Laterality Date  . ABDOMINAL HYSTERECTOMY     1 ovary left  . ANTERIOR APPROACH HEMI HIP ARTHROPLASTY Left 08/12/2017   Procedure: ANTERIOR APPROACH HEMI HIP ARTHROPLASTY;  Surgeon: Leandrew Koyanagi, MD;  Location: [facility];  Service: Orthopedics;  Laterality: Left;  . ENDOBRONCHIAL ULTRASOUND Bilateral 10/15/2016   Procedure: ENDOBRONCHIAL ULTRASOUND;  Surgeon: Collene Gobble, MD;  Location: [facility];  Service: Cardiopulmonary;  Laterality: Bilateral;  . INCONTINENCE SURGERY     bladder sling  . right middle lobe lung surgery  06/26/2016   [facility]    REVIEW OF SYSTEMS:  A comprehensive review of systems was negative except for: Constitutional: positive for fatigue   PHYSICAL EXAMINATION: General appearance: alert, cooperative, fatigued and no distress Head: Normocephalic, without obvious abnormality, atraumatic Neck: no adenopathy, no JVD, supple, symmetrical, trachea midline and thyroid not enlarged, symmetric, no tenderness/mass/nodules Lymph nodes: Cervical, supraclavicular, and axillary nodes normal. Resp: clear to auscultation bilaterally Back: symmetric, no curvature. ROM normal. No CVA tenderness. Cardio: regular rate and rhythm, S1, S2 normal, no murmur, click, rub or gallop GI: soft, non-tender; bowel sounds normal; no masses,  no organomegaly Extremities: extremities normal, atraumatic, no cyanosis or edema  ECOG PERFORMANCE STATUS: 1 - Symptomatic but completely ambulatory  Blood pressure 109/88, pulse 89, temperature 97.8 F (36.6 C), temperature source Oral, resp. rate 18, height 5\' 5"  (1.651 m), weight 111 lb 12.8 oz (50.7 kg), SpO2 100 %.  LABORATORY DATA: Lab Results  Component Value Date   WBC 6.6 10/21/2017   HGB 12.2 10/21/2017   HCT 37.2 10/21/2017   MCV 88.8 10/21/2017   PLT 236 10/21/2017      Chemistry        Component Value Date/Time   NA 141 10/21/2017 1446   NA 135 (L) 01/10/2017 1045   K 3.6 10/21/2017 1446   K 4.9 01/10/2017 1045   CL 103 10/21/2017 1446   CO2 29 10/21/2017 1446   CO2 20 (L) 01/10/2017 1045   BUN 22 10/21/2017 1446   BUN 27.6 (H) 01/10/2017 1045   CREATININE 1.11 (H) 10/21/2017 1446   CREATININE 1.4 (H) 01/10/2017 1045      Component Value Date/Time   CALCIUM 10.4 (H) 10/21/2017 1446   CALCIUM 9.7 01/10/2017 1045   ALKPHOS 83 10/21/2017 1446   ALKPHOS 77 01/10/2017 1045   AST 14 (L) 10/21/2017 1446   AST 10 01/10/2017 1045   ALT 10 10/21/2017 1446   ALT 11 01/10/2017 1045   BILITOT 0.3 10/21/2017 1446   BILITOT 0.34 01/10/2017 1045       RADIOGRAPHIC STUDIES:  Ct Chest W Contrast  Result Date: 11/03/2017 CLINICAL DATA:  Right lung cancer. Radiation therapy and chemotherapy complete. Shortness of breath and weakness with exertion. EXAM: CT CHEST WITH CONTRAST TECHNIQUE: Multidetector CT imaging of the chest was performed during intravenous contrast administration. CONTRAST:  84mL OMNIPAQUE IOHEXOL 300 MG/ML  SOLN COMPARISON:  08/01/2017. FINDINGS: Cardiovascular: Atherosclerotic calcification of the arterial vasculature, including coronary arteries. Heart size normal. No pericardial effusion. Mediastinum/Nodes: No pathologically enlarged mediastinal, hilar or axillary lymph nodes. Esophagus is grossly unremarkable. Tiny hiatal hernia. Lungs/Pleura: Biapical pleuroparenchymal scarring. Confluent soft tissue and volume loss, bronchiectasis and architectural distortion in the medial right hemithorax, as before. Right middle lobectomy. Mild centrilobular emphysema. No pleural fluid. Airway is otherwise unremarkable. Upper Abdomen: Visualized portions of the liver, adrenal glands, kidneys, spleen, pancreas, stomach and bowel are grossly unremarkable with exception of a tiny hiatal hernia. No upper abdominal adenopathy. Musculoskeletal: Degenerative changes in the spine. No  worrisome lytic or sclerotic lesions. IMPRESSION: 1. Right middle lobectomy with post radiation soft tissue confluence and volume loss in the medial right hemithorax. No evidence of metastatic disease. 2. Aortic atherosclerosis (ICD10-170.0). Coronary artery calcification. 3.  Emphysema (ICD10-J43.9). Electronically Signed   By: Lorin Picket M.D.   On: 11/03/2017 14:06    ASSESSMENT AND PLAN: This is a very pleasant 72 years old white female with history of limited stage small cell lung cancer status post left upper lobectomy in June 2018.  The patient had evidence for disease recurrence with hypermetabolic subcarinal lymphadenopathy that was biopsy-proven to be small cell carcinoma. She completed systemic chemotherapy with cisplatin and etoposide.  She is status post 4 cycles.  This is concurrent with radiotherapy.   She also underwent prophylactic cranial irradiation completed in early March 2019. The patient is currently on observation and she is feeling fine except for the fatigue. She had repeat CT scan of the chest performed recently.  I personally and independently reviewed the scans and discussed the results with the patient today. Her scan showed no concerning findings for disease recurrence or progression. I recommended for the patient to continue on observation with repeat CT scan of the chest in 6 months. The patient was advised to call immediately if she has any concerning symptoms in the interval. The patient voices understanding of current disease status and treatment options and is in agreement with the current care plan.  All questions were answered. The patient knows to call the clinic with any problems, questions or concerns. We can certainly see the patient much sooner if necessary.  Disclaimer: This note was dictated with voice recognition software. Similar sounding words can inadvertently be transcribed and may not be corrected upon review.

## 2017-11-06 ENCOUNTER — Telehealth: Payer: Self-pay

## 2017-11-06 ENCOUNTER — Telehealth: Payer: Self-pay | Admitting: Internal Medicine

## 2017-11-06 ENCOUNTER — Other Ambulatory Visit: Payer: Self-pay

## 2017-11-06 ENCOUNTER — Encounter: Payer: Self-pay | Admitting: Internal Medicine

## 2017-11-06 ENCOUNTER — Inpatient Hospital Stay (HOSPITAL_BASED_OUTPATIENT_CLINIC_OR_DEPARTMENT_OTHER): Payer: Medicare Other | Admitting: Internal Medicine

## 2017-11-06 VITALS — BP 97/76 | HR 100 | Temp 98.0°F | Resp 17 | Ht 65.0 in | Wt 111.8 lb

## 2017-11-06 DIAGNOSIS — C3411 Malignant neoplasm of upper lobe, right bronchus or lung: Secondary | ICD-10-CM

## 2017-11-06 DIAGNOSIS — Z87891 Personal history of nicotine dependence: Secondary | ICD-10-CM | POA: Diagnosis not present

## 2017-11-06 DIAGNOSIS — Z923 Personal history of irradiation: Secondary | ICD-10-CM

## 2017-11-06 DIAGNOSIS — Z79899 Other long term (current) drug therapy: Secondary | ICD-10-CM

## 2017-11-06 DIAGNOSIS — G255 Other chorea: Secondary | ICD-10-CM | POA: Diagnosis not present

## 2017-11-06 DIAGNOSIS — Z9221 Personal history of antineoplastic chemotherapy: Secondary | ICD-10-CM

## 2017-11-06 DIAGNOSIS — Z8249 Family history of ischemic heart disease and other diseases of the circulatory system: Secondary | ICD-10-CM

## 2017-11-06 DIAGNOSIS — Z96642 Presence of left artificial hip joint: Secondary | ICD-10-CM

## 2017-11-06 MED ORDER — DEXAMETHASONE 4 MG PO TABS: 4.0000 mg | ORAL_TABLET | Freq: Two times a day (BID) | ORAL | 0 refills | Status: DC

## 2017-11-06 NOTE — Progress Notes (Signed)
Robyn Barton, [place] [address] [phone]   Interval Evaluation  Date of Service: 11/06/17 Patient Name: Robyn Barton Patient MRN: [id number] Patient DOB: 12-22-1945 Provider: Ventura Sellers, MD  Identifying Statement:  STAISHA WINIARSKI is a 72 y.o. female with involuntary movements who presents for initial consultation and evaluation regarding cancer associated neurologic deficits.    Referring Provider: Shelda Pal, DO [address] [place] [address] [place], [place] [address]  Primary Cancer: Small Cell Lung [name])  Oncologic History: 1) right upper lobectomy on 06/26/2016 and the final pathology was consistent with small cell lung cancer measuring 1.6 cm in size. 2) systemic chemotherapy with cisplatin 60 MG/M2 on day 1 and etoposide 120 MG/M2 on days 1, 2 and 3 every 3 weeks.  Status post 4 cycles.  This was concurrent with radiation. 3) prophylactic cranial irradiation under the care of Dr. Isidore Moos.  CURRENT THERAPY: Observation.  Interval History:  Robyn Barton presents to discuss her movement disorder and recent workup.  She describes persistence of "jerkiness" affecting her face, tongue, eyes, head and body, unchanged from prior.  She does not feel like the movements impair her functioning or day to day quality of life.  She feels symptoms worsen with stress.  Recent staging scans were encouraging and she remains on observation for small cell lung cancer.  Otherwise denies headaches, seizures.  Medications: Current Outpatient Medications on File Prior to Visit  Medication Sig Dispense Refill  . albuterol (PROVENTIL HFA;VENTOLIN HFA) 108 (90 Base) MCG/ACT inhaler Inhale 2 puffs into the lungs every 6 (six) hours as needed for wheezing or shortness of breath. 1 Inhaler 3  . calcium carbonate (TUMS - DOSED IN MG ELEMENTAL CALCIUM) 500 MG chewable tablet Chew 1 tablet daily as needed by mouth for  indigestion or heartburn.    . diphenhydramine-acetaminophen (TYLENOL PM) 25-500 MG TABS tablet Take 1 tablet at bedtime as needed by mouth (sleep).    Marland Kitchen guaiFENesin (MUCINEX) 600 MG 12 hr tablet Take 600 mg by mouth 2 (two) times daily as needed for cough or to loosen phlegm.    . loratadine (CLARITIN) 10 MG tablet Take 10 mg by mouth daily as needed (hay fever).    Marland Kitchen omeprazole (PRILOSEC) 40 MG capsule Take 40 mg by mouth daily.   3  . ondansetron (ZOFRAN) 8 MG tablet TAKE 1 TABLET (8 MG TOTAL) BY MOUTH EVERY 8 (EIGHT) HOURS AS NEEDED FOR UP TO 7 DAYS FOR NAUSEA.  2  . polyethylene glycol (MIRALAX / GLYCOLAX) packet Take 17 g by mouth daily.    . sertraline (ZOLOFT) 100 MG tablet Take 1 tablet (100 mg total) by mouth daily. 90 tablet 3  . tiotropium (SPIRIVA HANDIHALER) 18 MCG inhalation capsule Place 1 capsule (18 mcg total) into inhaler and inhale daily. 30 capsule 3   No current facility-administered medications on file prior to visit.     Allergies: No Known Allergies Past Medical History:  Past Medical History:  Diagnosis Date  . Allergic rhinitis   . Anxiety   . Elevated TSH    pt denies  . Emphysema lung ([place])   . Enlarged lymph nodes    in chest  . GERD (gastroesophageal reflux disease)   . Goals of care, counseling/discussion 10/21/2016  . History of hiatal hernia   . History of radiation therapy 03/10/17- 03/21/17   Whole brain, prophylactic, radiation 25 Gy in 10 fractions  .  History of radiation therapy 11/18/16- 01/16/17   59.4 Gy to lung in 33 fractions.  . Hyperlipidemia   . IFG (impaired fasting glucose)   . Osteopenia   . Recurrent major depressive disorder ([place])   . Sleep apnea    mild no cpap needed  . Small cell carcinoma of right lung ([place]) 2018   surgery right middle lobe removed   . Tobacco dependence   . Varicose veins of both lower extremities   . Vitamin D deficiency    Past Surgical History:  Past Surgical History:  Procedure Laterality Date  .  ABDOMINAL HYSTERECTOMY     1 ovary left  . ANTERIOR APPROACH HEMI HIP ARTHROPLASTY Left 08/12/2017   Procedure: ANTERIOR APPROACH HEMI HIP ARTHROPLASTY;  Surgeon: Leandrew Koyanagi, MD;  Location: [facility];  Service: Orthopedics;  Laterality: Left;  . ENDOBRONCHIAL ULTRASOUND Bilateral 10/15/2016   Procedure: ENDOBRONCHIAL ULTRASOUND;  Surgeon: Collene Gobble, MD;  [facility];  Service: Cardiopulmonary;  Laterality: Bilateral;  . INCONTINENCE SURGERY     bladder sling  . right middle lobe lung surgery  06/26/2016   [facility]   Social History:  Social History   Socioeconomic History  . Marital status: Divorced    Spouse name: Not on file  . Number of children: Not on file  . Years of education: Not on file  . Highest education level: Not on file  Occupational History  . Not on file  Social Needs  . Financial resource strain: Not on file  . Food insecurity:    Worry: Not on file    Inability: Not on file  . Transportation needs:    Medical: Not on file    Non-medical: Not on file  Tobacco Use  . Smoking status: Former Smoker    Packs/day: 1.50    Years: 50.00    Pack years: 75.00    Types: Cigarettes    Last attempt to quit: 06/14/2016    Years since quitting: 1.3  . Smokeless tobacco: Never Used  Substance and Sexual Activity  . Alcohol use: Yes    Comment: rare  . Drug use: No  . Sexual activity: Never  Lifestyle  . Physical activity:    Days per week: Not on file    Minutes per session: Not on file  . Stress: Not on file  Relationships  . Social connections:    Talks on phone: Not on file    Gets together: Not on file    Attends religious service: Not on file    Active member of club or organization: Not on file    Attends meetings of clubs or organizations: Not on file    Relationship status: Not on file  . Intimate partner violence:    Fear of current or ex partner: Not on file    Emotionally abused: Not on file    Physically abused: Not on file     Forced sexual activity: Not on file  Other Topics Concern  . Not on file  Social History Narrative  . Not on file   Family History:  Family History  Problem Relation Age of Onset  . Depression Mother   . Cancer Father   . COPD Father   . Heart disease Father   . High blood pressure Father   . High Cholesterol Father     Review of Systems: Constitutional: Denies fevers, chills or abnormal weight loss Eyes: Denies blurriness of vision Ears, nose, mouth, throat, and face:  Denies mucositis or sore throat Respiratory: Denies cough, dyspnea or wheezes Cardiovascular: Denies palpitation, chest discomfort or lower extremity swelling Gastrointestinal: ++nausea GU: Denies dysuria or incontinence Skin: Denies abnormal skin rashes Neurological: Per HPI Musculoskeletal: Denies joint pain, back or neck discomfort. No decrease in ROM Behavioral/Psych: +anxiety   Physical Exam: Vitals:   11/06/17 1053  BP: 97/76  Pulse: 100  Resp: 17  Temp: 98 F (36.7 C)  SpO2: 100%   KPS: 80. General: Alert, cooperative, pleasant, in no acute distress Head: Craniotomy scar noted, dry and intact. EENT: No conjunctival injection or scleral icterus. Oral mucosa moist Lungs: Resp effort normal Cardiac: Regular rate and rhythm Abdomen: Soft, non-distended abdomen Skin: No rashes cyanosis or petechiae. Extremities: No clubbing or edema  Neurologic Exam: Mental Status: Awake, alert, attentive to examiner. Oriented to self and environment. Language is fluent with intact comprehension.  Cranial Nerves: Visual acuity is grossly normal. Visual fields are full. Extra-ocular movements intact. No ptosis. Face is symmetric, tongue midline. Motor: Noted dancelike involuntary movements affecting head/face, arms, legs and trunk c/w chorea. Tone normal and bulk is diminished throughout. Power is full in both arms and legs. Reflexes are symmetric, no pathologic reflexes present. Intact finger to nose  bilaterally Sensory: Intact to light touch and temperature Gait: Normal, independent  Labs: I have reviewed the data as listed    Component Value Date/Time   NA 141 10/21/2017 1446   NA 135 (L) 01/10/2017 1045   K 3.6 10/21/2017 1446   K 4.9 01/10/2017 1045   CL 103 10/21/2017 1446   CO2 29 10/21/2017 1446   CO2 20 (L) 01/10/2017 1045   GLUCOSE 120 (H) 10/21/2017 1446   GLUCOSE 103 01/10/2017 1045   BUN 22 10/21/2017 1446   BUN 27.6 (H) 01/10/2017 1045   CREATININE 1.11 (H) 10/21/2017 1446   CREATININE 1.4 (H) 01/10/2017 1045   CALCIUM 10.4 (H) 10/21/2017 1446   CALCIUM 9.7 01/10/2017 1045   PROT 7.8 10/21/2017 1446   PROT 6.7 01/10/2017 1045   ALBUMIN 4.0 10/21/2017 1446   ALBUMIN 3.5 01/10/2017 1045   AST 14 (L) 10/21/2017 1446   AST 10 01/10/2017 1045   ALT 10 10/21/2017 1446   ALT 11 01/10/2017 1045   ALKPHOS 83 10/21/2017 1446   ALKPHOS 77 01/10/2017 1045   BILITOT 0.3 10/21/2017 1446   BILITOT 0.34 01/10/2017 1045   GFRNONAA 48 (L) 10/21/2017 1446   GFRAA 55 (L) 10/21/2017 1446   Lab Results  Component Value Date   WBC 6.6 10/21/2017   NEUTROABS 5.1 10/21/2017   HGB 12.2 10/21/2017   HCT 37.2 10/21/2017   MCV 88.8 10/21/2017   PLT 236 10/21/2017    Component     Latest Ref Rng & Units 10/21/2017  [facility] test code      SEND-OUTS  [facility] test name      SEND-OUTS  Misc [facility] result      COMMENT  CRP     <1.0 mg/dL 1.1 (H)  Sed Rate     0 - 22 mm/hr 65 (H)  Copper     72 - 166 ug/dL 127  Magnesium     1.7 - 2.4 mg/dL 1.9    Assessment/Plan 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  She appears to be stable at this time.  We reviewed her serologies, including paraneoplastic serum testing which was unremarkable.  She does have an elevated ESR but CRP was WNL.  Differential still includes autoimmune process.  We recommended a short course of dexamethasone 75m BID x7 days as a trial therapy.  If  clinical response is obtained from steroids, further workup will be done to uncover inflammatory etiology.  Also t/c referral to movement disorders specialist.  She will call u[place]ain 1 week with an update on response to corticosteroids.  We appreciate the opportunity to participate in the care of LArlina Robes  She should return to clinic as needed for symptoms.  All questions were answered. The patient knows to call the clinic with any problems, questions or concerns. No barriers to learning were detected.  The total time spent in the encounter was 40 minutes and more than 50% was on counseling and review of test results   ZVentura Sellers MD Medical Director of Neuro-Oncology [facility] [place]10/24/19 11:14 AM

## 2017-11-06 NOTE — Telephone Encounter (Signed)
Scheduled appt per 10/23 los - sent reminder letter in the mail with appt date and time.

## 2017-11-06 NOTE — Telephone Encounter (Signed)
Printed avs and calender of upcoming appointment. Per 10/24 los

## 2017-11-07 ENCOUNTER — Telehealth: Payer: Self-pay

## 2017-11-07 LAB — MISC LABCORP TEST (SEND OUT)

## 2017-11-07 NOTE — Telephone Encounter (Signed)
Received phone call from pt requesting scan and previous lab results to be faxed to PCP. Called office and acquired fax number for [facility] for Dr. Malen Gauze. Confirmed fax receipt to [phone] 11/07/17 at 0947.

## 2017-11-13 ENCOUNTER — Telehealth: Payer: Self-pay | Admitting: *Deleted

## 2017-11-13 ENCOUNTER — Other Ambulatory Visit: Payer: Self-pay | Admitting: Internal Medicine

## 2017-11-13 MED ORDER — DEXAMETHASONE 4 MG PO TABS: 4.0000 mg | ORAL_TABLET | Freq: Every day | ORAL | 3 refills | Status: DC

## 2017-11-13 NOTE — Telephone Encounter (Signed)
Patient called to give 1 week update after taking Decadron 4 mg BID for 1 week for the Chorea.  Patient reports feeling the best she has in a long time. She states that prior to taking this medication she would spend the majority of her days nauseated until mid afternoon and wouldn't have enough energy to get out of bed.  She now reports no nausea/no fatigue/regained appetite and feels that her walking has improved.  She still reports having some involuntary movement but states that her walking is better and that the involuntary movement that remains isn't a big problem to her.  Per Dr Mickeal Skinner ok to resume Decadron but at a new dose of 4 mg once daily for 1 month with reevaluation at that time as steroids are not a long term option.  Attempted to notify patient, lm of details with appointment date and time and requested to call back if she had any further questions.

## 2017-11-27 ENCOUNTER — Ambulatory Visit (INDEPENDENT_AMBULATORY_CARE_PROVIDER_SITE_OTHER): Payer: Medicare Other | Admitting: Orthopaedic Surgery

## 2017-12-09 ENCOUNTER — Inpatient Hospital Stay: Payer: Medicare Other | Attending: Radiation Oncology | Admitting: Internal Medicine

## 2017-12-09 VITALS — BP 135/115 | HR 92 | Temp 98.2°F | Resp 18 | Ht 65.0 in | Wt 122.8 lb

## 2017-12-09 DIAGNOSIS — Z8249 Family history of ischemic heart disease and other diseases of the circulatory system: Secondary | ICD-10-CM | POA: Insufficient documentation

## 2017-12-09 DIAGNOSIS — C3411 Malignant neoplasm of upper lobe, right bronchus or lung: Secondary | ICD-10-CM | POA: Diagnosis present

## 2017-12-09 DIAGNOSIS — Z85118 Personal history of other malignant neoplasm of bronchus and lung: Secondary | ICD-10-CM | POA: Diagnosis not present

## 2017-12-09 DIAGNOSIS — Z79899 Other long term (current) drug therapy: Secondary | ICD-10-CM | POA: Insufficient documentation

## 2017-12-09 DIAGNOSIS — G255 Other chorea: Secondary | ICD-10-CM | POA: Insufficient documentation

## 2017-12-09 DIAGNOSIS — F419 Anxiety disorder, unspecified: Secondary | ICD-10-CM | POA: Diagnosis not present

## 2017-12-09 DIAGNOSIS — Z87891 Personal history of nicotine dependence: Secondary | ICD-10-CM

## 2017-12-09 MED ORDER — DEXAMETHASONE 2 MG PO TABS: 2.0000 mg | ORAL_TABLET | Freq: Every day | ORAL | 3 refills | Status: DC

## 2017-12-09 NOTE — Progress Notes (Signed)
[facility] [place], [place] [address] [phone]   Interval Evaluation  Date of Service: 12/09/17 Patient Name: Robyn Barton Patient MRN: [id number] Patient DOB: May 05, 1945 Provider: Ventura Sellers, MD  Identifying Statement:  Robyn Barton is a 72 y.o. female with involuntary movements   Primary Cancer: Small Cell Lung [name])  Oncologic History: 1) right upper lobectomy on 06/26/2016 and the final pathology was consistent with small cell lung cancer measuring 1.6 cm in size. 2) systemic chemotherapy with cisplatin 60 MG/M2 on day 1 and etoposide 120 MG/M2 on days 1, 2 and 3 every 3 weeks.  Status post 4 cycles.  This was concurrent with radiation. 3) prophylactic cranial irradiation under the care of Dr. Isidore Moos.  CURRENT THERAPY: Observation.  Interval History:  [name] presents to discuss her movement disorder after recently starting steroids.  She describes a dramatic improvement in overall energy, appetite, and resolution of chronic nausea since starting the decadron.  She denies however, any improvement in her movement disorder, and no improvement in gait or neuropathic symptoms.  Medications: Current Outpatient Medications on File Prior to Visit  Medication Sig Dispense Refill  . albuterol (PROVENTIL HFA;VENTOLIN HFA) 108 (90 Base) MCG/ACT inhaler Inhale 2 puffs into the lungs every 6 (six) hours as needed for wheezing or shortness of breath. 1 Inhaler 3  . calcium carbonate (TUMS - DOSED IN MG ELEMENTAL CALCIUM) 500 MG chewable tablet Chew 1 tablet daily as needed by mouth for indigestion or heartburn.    . dexamethasone (DECADRON) 4 MG tablet Take 1 tablet (4 mg total) by mouth daily. 30 tablet 3  . diphenhydramine-acetaminophen (TYLENOL PM) 25-500 MG TABS tablet Take 1 tablet at bedtime as needed by mouth (sleep).    Marland Kitchen guaiFENesin (MUCINEX) 600 MG 12 hr tablet Take 600 mg by mouth 2 (two) times daily as  needed for cough or to loosen phlegm.    . loratadine (CLARITIN) 10 MG tablet Take 10 mg by mouth daily as needed (hay fever).    Marland Kitchen omeprazole (PRILOSEC) 40 MG capsule Take 40 mg by mouth daily.   3  . ondansetron (ZOFRAN) 8 MG tablet TAKE 1 TABLET (8 MG TOTAL) BY MOUTH EVERY 8 (EIGHT) HOURS AS NEEDED FOR UP TO 7 DAYS FOR NAUSEA.  2  . polyethylene glycol (MIRALAX / GLYCOLAX) packet Take 17 g by mouth daily.    . sertraline (ZOLOFT) 100 MG tablet Take 1 tablet (100 mg total) by mouth daily. 90 tablet 3  . tiotropium (SPIRIVA HANDIHALER) 18 MCG inhalation capsule Place 1 capsule (18 mcg total) into inhaler and inhale daily. 30 capsule 3   No current facility-administered medications on file prior to visit.     Allergies: No Known Allergies Past Medical History:  Past Medical History:  Diagnosis Date  . Allergic rhinitis   . Anxiety   . Elevated TSH    pt denies  . Emphysema lung ([place])   . Enlarged lymph nodes    in chest  . GERD (gastroesophageal reflux disease)   . Goals of care, counseling/discussion 10/21/2016  . History of hiatal hernia   . History of radiation therapy 03/10/17- 03/21/17   Whole brain, prophylactic, radiation 25 Gy in 10 fractions  . History of radiation therapy 11/18/16- 01/16/17   59.4 Gy to lung in 33 fractions.  . Hyperlipidemia   . IFG (impaired fasting glucose)   . Osteopenia   . Recurrent major  depressive disorder ([place])   . Sleep apnea    mild no cpap needed  . Small cell carcinoma of right lung ([place]) 2018   surgery right middle lobe removed   . Tobacco dependence   . Varicose veins of both lower extremities   . Vitamin D deficiency    Past Surgical History:  Past Surgical History:  Procedure Laterality Date  . ABDOMINAL HYSTERECTOMY     1 ovary left  . ANTERIOR APPROACH HEMI HIP ARTHROPLASTY Left 08/12/2017   Procedure: ANTERIOR APPROACH HEMI HIP ARTHROPLASTY;  Surgeon: Leandrew Koyanagi, MD;  Location: [facility];  Service: Orthopedics;  Laterality: Left;    . ENDOBRONCHIAL ULTRASOUND Bilateral 10/15/2016   Procedure: ENDOBRONCHIAL ULTRASOUND;  Surgeon: Collene Gobble, MD;  Location: [facility];  Service: Cardiopulmonary;  Laterality: Bilateral;  . INCONTINENCE SURGERY     bladder sling  . right middle lobe lung surgery  06/26/2016   [facility]   Social History:  Social History   Socioeconomic History  . Marital status: Divorced    Spouse name: Not on file  . Number of children: Not on file  . Years of education: Not on file  . Highest education level: Not on file  Occupational History  . Not on file  Social Needs  . Financial resource strain: Not on file  . Food insecurity:    Worry: Not on file    Inability: Not on file  . Transportation needs:    Medical: Not on file    Non-medical: Not on file  Tobacco Use  . Smoking status: Former Smoker    Packs/day: 1.50    Years: 50.00    Pack years: 75.00    Types: Cigarettes    Last attempt to quit: 06/14/2016    Years since quitting: 1.4  . Smokeless tobacco: Never Used  Substance and Sexual Activity  . Alcohol use: Yes    Comment: rare  . Drug use: No  . Sexual activity: Never  Lifestyle  . Physical activity:    Days per week: Not on file    Minutes per session: Not on file  . Stress: Not on file  Relationships  . Social connections:    Talks on phone: Not on file    Gets together: Not on file    Attends religious service: Not on file    Active member of club or organization: Not on file    Attends meetings of clubs or organizations: Not on file    Relationship status: Not on file  . Intimate partner violence:    Fear of current or ex partner: Not on file    Emotionally abused: Not on file    Physically abused: Not on file    Forced sexual activity: Not on file  Other Topics Concern  . Not on file  Social History Narrative  . Not on file   Family History:  Family History  Problem Relation Age of Onset  . Depression Mother   . Cancer Father   . COPD Father    . Heart disease Father   . High blood pressure Father   . High Cholesterol Father     Review of Systems: Constitutional: Denies fevers, chills or abnormal weight loss Eyes: Denies blurriness of vision Ears, nose, mouth, throat, and face: Denies mucositis or sore throat Respiratory: Denies cough, dyspnea or wheezes Cardiovascular: Denies palpitation, chest discomfort or lower extremity swelling Gastrointestinal: resolved nausea GU: Denies dysuria or incontinence Skin: Denies abnormal skin rashes  Neurological: Per HPI Musculoskeletal: Denies joint pain, back or neck discomfort. No decrease in ROM Behavioral/Psych: +anxiety   Physical Exam: Vitals:   12/09/17 1059  BP: (!) 135/115  Pulse: 92  Resp: 18  Temp: 98.2 F (36.8 C)  SpO2: 99%   KPS: 80. General: Alert, cooperative, pleasant, in no acute distress Head: Craniotomy scar noted, dry and intact. EENT: No conjunctival injection or scleral icterus. Oral mucosa moist Lungs: Resp effort normal Cardiac: Regular rate and rhythm Abdomen: Soft, non-distended abdomen Skin: No rashes cyanosis or petechiae. Extremities: No clubbing or edema  Neurologic Exam: Mental Status: Awake, alert, attentive to examiner. Oriented to self and environment. Language is fluent with intact comprehension.  Cranial Nerves: Visual acuity is grossly normal. Visual fields are full. Extra-ocular movements intact. No ptosis. Face is symmetric, tongue midline. Motor: Noted dancelike involuntary movements affecting head/face, arms, legs and trunk c/w chorea. Tone normal and bulk is diminished throughout. Power is full in both arms and legs. Reflexes are symmetric, no pathologic reflexes present. Intact finger to nose bilaterally Sensory: Intact to light touch and temperature Gait: Normal, independent  Labs: I have reviewed the data as listed    Component Value Date/Time   NA 141 10/21/2017 1446   NA 135 (L) 01/10/2017 1045   K 3.6 10/21/2017 1446    K 4.9 01/10/2017 1045   CL 103 10/21/2017 1446   CO2 29 10/21/2017 1446   CO2 20 (L) 01/10/2017 1045   GLUCOSE 120 (H) 10/21/2017 1446   GLUCOSE 103 01/10/2017 1045   BUN 22 10/21/2017 1446   BUN 27.6 (H) 01/10/2017 1045   CREATININE 1.11 (H) 10/21/2017 1446   CREATININE 1.4 (H) 01/10/2017 1045   CALCIUM 10.4 (H) 10/21/2017 1446   CALCIUM 9.7 01/10/2017 1045   PROT 7.8 10/21/2017 1446   PROT 6.7 01/10/2017 1045   ALBUMIN 4.0 10/21/2017 1446   ALBUMIN 3.5 01/10/2017 1045   AST 14 (L) 10/21/2017 1446   AST 10 01/10/2017 1045   ALT 10 10/21/2017 1446   ALT 11 01/10/2017 1045   ALKPHOS 83 10/21/2017 1446   ALKPHOS 77 01/10/2017 1045   BILITOT 0.3 10/21/2017 1446   BILITOT 0.34 01/10/2017 1045   GFRNONAA 48 (L) 10/21/2017 1446   GFRAA 55 (L) 10/21/2017 1446   Lab Results  Component Value Date   WBC 6.6 10/21/2017   NEUTROABS 5.1 10/21/2017   HGB 12.2 10/21/2017   HCT 37.2 10/21/2017   MCV 88.8 10/21/2017   PLT 236 10/21/2017    Component     Latest Ref Rng & Units 10/21/2017  [facility] test code      SEND-OUTS  [facility] test name      SEND-OUTS  Misc [facility] result      COMMENT  CRP     <1.0 mg/dL 1.1 (H)  Sed Rate     0 - 22 mm/hr 65 (H)  Copper     72 - 166 ug/dL 127  Magnesium     1.7 - 2.4 mg/dL 1.9    Assessment/Plan 1. [place]  Ms. 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  She appears to be stable at this time.  Steroids were not helpful with movements but dramatically improved other systemic symptoms.  We recommended decreasing decadron to 2mg  daily.   She will follow up with [place] after next appointment with Dr. [name], or as needed.  We appreciate the opportunity to participate in the care of [name].  All questions were answered. The patient knows to call the clinic with any problems, questions or concerns. No barriers to learning were detected.  The total time spent in the encounter was 25 minutes and  more than 50% was on counseling and review of test results   Ventura Sellers, MD Medical Director of Neuro-Oncology [facility] at [place] 12/09/17 10:53 AM

## 2017-12-10 ENCOUNTER — Telehealth: Payer: Self-pay

## 2017-12-10 NOTE — Telephone Encounter (Signed)
Spoke with patient concerning added [place] onto her current schedule. Per 11/26 los

## 2017-12-22 ENCOUNTER — Other Ambulatory Visit: Payer: Self-pay | Admitting: *Deleted

## 2017-12-22 DIAGNOSIS — C7931 Secondary malignant neoplasm of brain: Secondary | ICD-10-CM

## 2017-12-22 DIAGNOSIS — C7949 Secondary malignant neoplasm of other parts of nervous system: Principal | ICD-10-CM

## 2018-01-28 ENCOUNTER — Other Ambulatory Visit: Payer: Self-pay | Admitting: Radiation Therapy

## 2018-01-29 ENCOUNTER — Ambulatory Visit
Admission: RE | Admit: 2018-01-29 | Discharge: 2018-01-29 | Disposition: A | Discharge status: Home / Self Care | Payer: Medicare Other | Source: Ambulatory Visit | Attending: Radiation Oncology | Admitting: Radiation Oncology

## 2018-01-29 DIAGNOSIS — C7931 Secondary malignant neoplasm of brain: Secondary | ICD-10-CM

## 2018-01-29 DIAGNOSIS — C7949 Secondary malignant neoplasm of other parts of nervous system: Principal | ICD-10-CM

## 2018-01-29 MED ORDER — GADOBENATE DIMEGLUMINE 529 MG/ML IV SOLN
6.0000 mL | Freq: Once | INTRAVENOUS | Status: AC | PRN
  Administered 2018-01-29: 6 mL via INTRAVENOUS

## 2018-01-29 NOTE — Progress Notes (Signed)
Robyn Barton presents for follow up of radiation completed 03/21/17 to her brain for PCI. She had a MRI on 01/29/18 and presents for the results. She reports feeling unsteady when ambulating. She has had 2 recent accidents with her foot slipping off the brake due to numbness in her feet. She reports recent headaches behind her right eye. Her right eye "is watery most of the time". She is eating well from steroids. She also reports decreased hearing to her bilateral ears. She continues to have involuntary movements to her head which she reports have worsened in the last 3-4 weeks. She will see Dr. [name] and Dr. Mickeal Skinner on 05/06/18.   BP 119/86 (BP Location: Right Arm, Patient Position: Sitting)   Pulse 90   Temp 97.7 F (36.5 C) (Oral)   Resp 20   Ht 5\' 5"  (1.651 m)   Wt 131 lb 12.8 oz (59.8 kg)   SpO2 100%   BMI 21.93 kg/m    Wt Readings from Last 3 Encounters:  02/03/18 131 lb 12.8 oz (59.8 kg)  12/09/17 122 lb 12.8 oz (55.7 kg)  11/06/17 111 lb 12.8 oz (50.7 kg)

## 2018-02-03 ENCOUNTER — Other Ambulatory Visit: Payer: Self-pay

## 2018-02-03 ENCOUNTER — Encounter: Payer: Self-pay | Admitting: Radiation Oncology

## 2018-02-03 ENCOUNTER — Ambulatory Visit
Admission: RE | Admit: 2018-02-03 | Discharge: 2018-02-03 | Disposition: A | Discharge status: Home / Self Care | Payer: Medicare Other | Source: Ambulatory Visit | Attending: Radiation Oncology | Admitting: Radiation Oncology

## 2018-02-03 DIAGNOSIS — Z923 Personal history of irradiation: Secondary | ICD-10-CM | POA: Diagnosis not present

## 2018-02-03 DIAGNOSIS — C3411 Malignant neoplasm of upper lobe, right bronchus or lung: Secondary | ICD-10-CM | POA: Diagnosis present

## 2018-02-03 DIAGNOSIS — Z79899 Other long term (current) drug therapy: Secondary | ICD-10-CM | POA: Diagnosis not present

## 2018-02-03 NOTE — Progress Notes (Signed)
Radiation Oncology         [phone]) [phone] ________________________________  Name: Robyn Barton MRN: [id number]  Date: 02/03/2018  DOB: [birth year]  Follow-Up Visit Note  Outpatient  CC: Robyn Barton., MD  Robyn Bears, MD  Diagnosis and Prior Radiotherapy:    ICD-10-CM   1. Small cell lung cancer, right upper lobe (HCC) C34.11    Small cell lung cancer responsive to CHRT - risk of brain metastases  03/10/17-03/21/17: Whole Brain PCI / 25 Gy in 10 fractions, Isodose plan/ 6X 11/18/16-01/16/17: 59.4 Gy in 33 fractions to the right upper lobe lung  CHIEF COMPLAINT: Here for follow-up and surveillance of lung cancer   Narrative:  The patient returns today for routine follow-up after prophylactic brain radiation completed 03/21/2017. Since our last visit the patient underwent brain MRI on 01/29/2018. This scan was negative for metastatic disease to the brain and showed mild interval progression of white matter changes, compatible with sequelae of radiation and chemotherapy. She also had a CT Chest done on 11/03/2017 which was negative for metastatic disease and showed right middle lobectomy with post radiation soft tissue confluence and volume loss in the medial right hemithorax.   On review of systems, the patient reports feeling unsteady when ambulating. She reports recent headaches behind her right eye. Her right eye "is watery most of the time". She is eating well from taking steroids, managed by Robyn Barton. She reports occasional shortness of breath. She reports decreased hearing to her bilateral ears. She states that she did not go to ENT. She continues to have involuntary movements to her head which she reports have worsened in the last 3-4 weeks. She will see Robyn Barton and Robyn Barton next on 05/06/2018.   I reviewed my consult note with the patient that preceded her PCI, and at that time, I had recorded in her physical exam that she had already been demonstrating subtle involuntary  movements.   ALLERGIES:  has No Known Allergies.  Meds: Current Outpatient Medications  Medication Sig Dispense Refill  . albuterol (PROVENTIL HFA;VENTOLIN HFA) 108 (90 Base) MCG/ACT inhaler Inhale 2 puffs into the lungs every 6 (six) hours as needed for wheezing or shortness of breath. 1 Inhaler 3  . calcium carbonate (TUMS - DOSED IN MG ELEMENTAL CALCIUM) 500 MG chewable tablet Chew 1 tablet daily as needed by mouth for indigestion or heartburn.    . dexamethasone (DECADRON) 2 MG tablet Take 1 tablet (2 mg total) by mouth daily. 30 tablet 3  . diphenhydramine-acetaminophen (TYLENOL PM) 25-500 MG TABS tablet Take 1 tablet at bedtime as needed by mouth (sleep).    Marland Kitchen guaiFENesin (MUCINEX) 600 MG 12 hr tablet Take 600 mg by mouth 2 (two) times daily as needed for cough or to loosen phlegm.    . loratadine (CLARITIN) 10 MG tablet Take 10 mg by mouth daily as needed (hay fever).    Marland Kitchen omeprazole (PRILOSEC) 40 MG capsule Take 40 mg by mouth daily.   3  . sertraline (ZOLOFT) 100 MG tablet Take 1 tablet (100 mg total) by mouth daily. 90 tablet 3  . tiotropium (SPIRIVA HANDIHALER) 18 MCG inhalation capsule Place 1 capsule (18 mcg total) into inhaler and inhale daily. 30 capsule 3  . meloxicam (MOBIC) 7.5 MG tablet Take 7.5 mg by mouth daily.    . ondansetron (ZOFRAN) 8 MG tablet TAKE 1 TABLET (8 MG TOTAL) BY MOUTH EVERY 8 (EIGHT) HOURS AS NEEDED FOR UP TO 7 DAYS FOR NAUSEA.  2  . polyethylene glycol (MIRALAX / GLYCOLAX) packet Take 17 g by mouth daily.     No current facility-administered medications for this encounter.     Physical Findings:  height is 5\' 5"  (1.651 m) and weight is 131 lb 12.8 oz (59.8 kg). Her oral temperature is 97.7 F (36.5 C). Her blood pressure is 119/86 and her pulse is 90. Her respiration is 20 and oxygen saturation is 100%.    General: Alert and oriented, in no acute distress. HEENT: Head is normocephalic. On the left side, the tympanic membrane is clear, and she has some  debris in the ear canal that is white in appearance, but the ear canal is overall patent. I cannot visualize the right tympanic membrane; she appears to have cerumen throughout the field of view. Heart: Regular in rate and rhythm with no murmurs, rubs, or gallops. Chest: High pitched sounds scattered throughout her lungs at the end of inspiration. Musculoskeletal: Symmetric strength and muscle tone throughout. Neurologic: No obvious focalities. Speech is fluent. Coordination is intact. Rapidly alternating movements are intact. Finger to nose testing shows a little tremor bilaterally. She still has involuntary movements of her head and neck. To my recollection, they are less pronounced than her previous appointment. Psychiatric: Judgment and insight are intact. Affect is appropriate.   Lab Findings: Lab Results  Component Value Date   WBC 6.6 10/21/2017   HGB 12.2 10/21/2017   HCT 37.2 10/21/2017   MCV 88.8 10/21/2017   PLT 236 10/21/2017    Radiographic Findings: Mr Jeri Cos RU Contrast  Result Date: 01/29/2018 CLINICAL DATA:  73 y/o F; history of small cell lung cancer with metastatic disease. Brain radiation 03/21/2017. Creatinine was obtained on site at [facility] at [address] Results: Creatinine 1.1 mg/dL. EXAM: MRI HEAD WITHOUT AND WITH CONTRAST TECHNIQUE: Multiplanar, multiecho pulse sequences of the brain and surrounding structures were obtained without and with intravenous contrast. CONTRAST:  4mL MULTIHANCE GADOBENATE DIMEGLUMINE 529 MG/ML IV SOLN COMPARISON:  07/28/2017, 02/17/2017, 09/14/2016 MRI head. FINDINGS: Brain: No acute infarction, hemorrhage, hydrocephalus, extra-axial collection or mass lesion. Further progression of large confluent nonspecific T2 FLAIR hyperintensities in subcortical and periventricular white matter. Stable mild volume loss of the brain. After administration of intravenous contrast there is no abnormal enhancement of the brain Vascular:  Normal flow voids. Skull and upper cervical spine: Normal marrow signal. Sinuses/Orbits: Small mucous retention cyst in the right maxillary sinus. Partial opacification of left mastoid air cells. Orbits are unremarkable. Other: None. IMPRESSION: 1. Negative for metastatic disease to the brain. 2. Mild interval progression of white matter changes compatible with sequelae of radiation and chemotherapy. Stable mild volume loss of the brain. Electronically Signed   By: Kristine Garbe M.D.   On: 01/29/2018 14:03    Impression/Plan:  Small cell lung cancer, status post PCI to the brain, also prior RT to lung by Dr Tammi Klippel. NED.  The patient continues to have involuntary movements of her head and neck which she states waxe and wane. She will continue to see neuro-oncologist Robyn Barton for current neurologic symptoms, some of which were documented before her radiotherapy.  The patient will see her PCP for evaluation of hearing loss and cerumen impaction.  She declined ENT consult last year after I referred her.  We will follow up with next brain MRI in 6 months. _____________________________________   Eppie Gibson, MD  This document serves as a record of services personally performed by Eppie Gibson, MD. It  was created on her behalf by Rae Lips, a trained medical scribe. The creation of this record is based on the scribe's personal observations and the provider's statements to them. This document has been checked and approved by the attending provider.

## 2018-02-04 ENCOUNTER — Encounter: Payer: Self-pay | Admitting: Radiation Oncology

## 2018-02-09 ENCOUNTER — Encounter: Payer: Self-pay | Admitting: Internal Medicine

## 2018-03-13 ENCOUNTER — Telehealth: Payer: Self-pay | Admitting: Internal Medicine

## 2018-03-13 NOTE — Telephone Encounter (Signed)
Scheduled appt per 2/28 sch message - pt aware.

## 2018-03-19 ENCOUNTER — Other Ambulatory Visit: Payer: Self-pay

## 2018-03-19 ENCOUNTER — Inpatient Hospital Stay: Payer: Medicare Other | Attending: Radiation Oncology | Admitting: Internal Medicine

## 2018-03-19 ENCOUNTER — Encounter: Payer: Self-pay | Admitting: Internal Medicine

## 2018-03-19 VITALS — BP 122/78 | HR 90 | Temp 98.0°F | Resp 18 | Ht 65.0 in | Wt 135.7 lb

## 2018-03-19 DIAGNOSIS — R269 Unspecified abnormalities of gait and mobility: Secondary | ICD-10-CM | POA: Insufficient documentation

## 2018-03-19 DIAGNOSIS — Z8249 Family history of ischemic heart disease and other diseases of the circulatory system: Secondary | ICD-10-CM | POA: Insufficient documentation

## 2018-03-19 DIAGNOSIS — G255 Other chorea: Secondary | ICD-10-CM

## 2018-03-19 DIAGNOSIS — Z923 Personal history of irradiation: Secondary | ICD-10-CM | POA: Diagnosis not present

## 2018-03-19 DIAGNOSIS — Z791 Long term (current) use of non-steroidal anti-inflammatories (NSAID): Secondary | ICD-10-CM | POA: Diagnosis not present

## 2018-03-19 DIAGNOSIS — Z87891 Personal history of nicotine dependence: Secondary | ICD-10-CM | POA: Insufficient documentation

## 2018-03-19 DIAGNOSIS — F419 Anxiety disorder, unspecified: Secondary | ICD-10-CM | POA: Diagnosis not present

## 2018-03-19 DIAGNOSIS — Z85118 Personal history of other malignant neoplasm of bronchus and lung: Secondary | ICD-10-CM | POA: Insufficient documentation

## 2018-03-19 DIAGNOSIS — Z79899 Other long term (current) drug therapy: Secondary | ICD-10-CM | POA: Diagnosis not present

## 2018-03-19 DIAGNOSIS — K219 Gastro-esophageal reflux disease without esophagitis: Secondary | ICD-10-CM

## 2018-03-19 NOTE — Progress Notes (Signed)
[facility] at [place], [place] [address] [phone]   Interval Evaluation  Date of Service: 03/19/18 Patient Name: Robyn Barton Patient MRN: [id number] Patient DOB: [birth year] Provider: Ventura Sellers, MD  Identifying Statement:  Robyn Barton is a 73 y.o. female with involuntary movements   Primary Cancer: Small Cell Lung [name])  Oncologic History: 1) right upper lobectomy on 06/26/2016 and the final pathology was consistent with small cell lung cancer measuring 1.6 cm in size. 2) systemic chemotherapy with cisplatin 60 MG/M2 on day 1 and etoposide 120 MG/M2 on days 1, 2 and 3 every 3 weeks.  Status post 4 cycles.  This was concurrent with radiation. 3) prophylactic cranial irradiation under the care of Dr. Isidore Moos.  CURRENT THERAPY: Observation.  Interval History:  [name] presents for clinical follow up.  She describes continued improvement in overall energy, appetite and nausea since starting the decadron, which she has now decreased to 1mg  daily.  Her shaking/jerking movements have subsided somewhat, although there has been minimal improvement in gait or neuropathic symptoms.  She is no longer driving.  Medications: Current Outpatient Medications on File Prior to Visit  Medication Sig Dispense Refill  . albuterol (PROVENTIL HFA;VENTOLIN HFA) 108 (90 Base) MCG/ACT inhaler Inhale 2 puffs into the lungs every 6 (six) hours as needed for wheezing or shortness of breath. 1 Inhaler 3  . calcium carbonate (TUMS - DOSED IN MG ELEMENTAL CALCIUM) 500 MG chewable tablet Chew 1 tablet daily as needed by mouth for indigestion or heartburn.    . dexamethasone (DECADRON) 2 MG tablet Take 1 tablet (2 mg total) by mouth daily. 30 tablet 3  . guaiFENesin (MUCINEX) 600 MG 12 hr tablet Take 600 mg by mouth 2 (two) times daily as needed for cough or to loosen phlegm.    . Ibuprofen-diphenhydrAMINE Cit (MOTRIN PM PO) Take by mouth.      Marland Kitchen omeprazole (PRILOSEC) 40 MG capsule Take 40 mg by mouth daily.   3  . sertraline (ZOLOFT) 100 MG tablet Take 1 tablet (100 mg total) by mouth daily. 90 tablet 3  . tiotropium (SPIRIVA HANDIHALER) 18 MCG inhalation capsule Place 1 capsule (18 mcg total) into inhaler and inhale daily. 30 capsule 3  . diphenhydramine-acetaminophen (TYLENOL PM) 25-500 MG TABS tablet Take 1 tablet at bedtime as needed by mouth (sleep).    . loratadine (CLARITIN) 10 MG tablet Take 10 mg by mouth daily as needed (hay fever).    . meloxicam (MOBIC) 7.5 MG tablet Take 7.5 mg by mouth daily.    . ondansetron (ZOFRAN) 8 MG tablet TAKE 1 TABLET (8 MG TOTAL) BY MOUTH EVERY 8 (EIGHT) HOURS AS NEEDED FOR UP TO 7 DAYS FOR NAUSEA.  2  . polyethylene glycol (MIRALAX / GLYCOLAX) packet Take 17 g by mouth daily.     No current facility-administered medications on file prior to visit.     Allergies: No Known Allergies Past Medical History:  Past Medical History:  Diagnosis Date  . Allergic rhinitis   . Anxiety   . Elevated TSH    pt denies  . Emphysema lung ([place])   . Enlarged lymph nodes    in chest  . GERD (gastroesophageal reflux disease)   . Goals of care, counseling/discussion 10/21/2016  . History of hiatal hernia   . History of radiation therapy 03/10/17- 03/21/17   Whole brain, prophylactic, radiation 25 Gy in 10 fractions  . History  of radiation therapy 11/18/16- 01/16/17   59.4 Gy to lung in 33 fractions.  . Hyperlipidemia   . IFG (impaired fasting glucose)   . Osteopenia   . Recurrent major depressive disorder ([place])   . Sleep apnea    mild no cpap needed  . Small cell carcinoma of right lung ([facility]) 2018   surgery right middle lobe removed   . Tobacco dependence   . Varicose veins of both lower extremities   . Vitamin D deficiency    Past Surgical History:  Past Surgical History:  Procedure Laterality Date  . ABDOMINAL HYSTERECTOMY     1 ovary left  . ANTERIOR APPROACH HEMI HIP ARTHROPLASTY Left  08/12/2017   Procedure: ANTERIOR APPROACH HEMI HIP ARTHROPLASTY;  Surgeon: Leandrew Koyanagi, MD;  Location: [facility];  Service: Orthopedics;  Laterality: Left;  . ENDOBRONCHIAL ULTRASOUND Bilateral 10/15/2016   Procedure: ENDOBRONCHIAL ULTRASOUND;  Surgeon: Collene Gobble, MD;  Location: [facility];  Service: Cardiopulmonary;  Laterality: Bilateral;  . INCONTINENCE SURGERY     bladder sling  . right middle lobe lung surgery  06/26/2016   [facility]   Social History:  Social History   Socioeconomic History  . Marital status: Divorced    Spouse name: Not on file  . Number of children: Not on file  . Years of education: Not on file  . Highest education level: Not on file  Occupational History  . Not on file  Social Needs  . Financial resource strain: Not on file  . Food insecurity:    Worry: Not on file    Inability: Not on file  . Transportation needs:    Medical: No    Non-medical: No  Tobacco Use  . Smoking status: Former Smoker    Packs/day: 1.50    Years: 50.00    Pack years: 75.00    Types: Cigarettes    Last attempt to quit: 06/14/2016    Years since quitting: 1.7  . Smokeless tobacco: Never Used  Substance and Sexual Activity  . Alcohol use: Yes    Comment: rare  . Drug use: No  . Sexual activity: Never  Lifestyle  . Physical activity:    Days per week: Not on file    Minutes per session: Not on file  . Stress: Not on file  Relationships  . Social connections:    Talks on phone: Not on file    Gets together: Not on file    Attends religious service: Not on file    Active member of club or organization: Not on file    Attends meetings of clubs or organizations: Not on file    Relationship status: Not on file  . Intimate partner violence:    Fear of current or ex partner: No    Emotionally abused: No    Physically abused: No    Forced sexual activity: No  Other Topics Concern  . Not on file  Social History Narrative  . Not on file   Family History:  Family  History  Problem Relation Age of Onset  . Depression Mother   . Cancer Father   . COPD Father   . Heart disease Father   . High blood pressure Father   . High Cholesterol Father     Review of Systems: Constitutional: Denies fevers, chills or abnormal weight loss Eyes: Denies blurriness of vision Ears, nose, mouth, throat, and face: Denies mucositis or sore throat Respiratory: Denies cough, dyspnea or wheezes Cardiovascular: Denies  palpitation, chest discomfort or lower extremity swelling Gastrointestinal: resolved nausea GU: Denies dysuria or incontinence Skin: Denies abnormal skin rashes Neurological: Per HPI Musculoskeletal: Denies joint pain, back or neck discomfort. No decrease in ROM Behavioral/Psych: +anxiety   Physical Exam: Vitals:   03/19/18 1155  BP: 122/78  Pulse: 90  Resp: 18  Temp: 98 F (36.7 C)  SpO2: 100%   KPS: 80. General: Alert, cooperative, pleasant, in no acute distress Head: Craniotomy scar noted, dry and intact. EENT: No conjunctival injection or scleral icterus. Oral mucosa moist Lungs: Resp effort normal Cardiac: Regular rate and rhythm Abdomen: Soft, non-distended abdomen Skin: No rashes cyanosis or petechiae. Extremities: No clubbing or edema  Neurologic Exam: Mental Status: Awake, alert, attentive to examiner. Oriented to self and environment. Language is fluent with intact comprehension.  Cranial Nerves: Visual acuity is grossly normal. Visual fields are full. Extra-ocular movements intact. No ptosis. Face is symmetric, tongue midline. Motor: Tone normal and bulk is diminished throughout. Power is full in both arms and legs. Reflexes are symmetric, no pathologic reflexes present. Intact finger to nose bilaterally Sensory: Intact to light touch and temperature Gait: Normal, independent  Labs: I have reviewed the data as listed    Component Value Date/Time   NA 141 10/21/2017 1446   NA 135 (L) 01/10/2017 1045   K 3.6 10/21/2017 1446     K 4.9 01/10/2017 1045   CL 103 10/21/2017 1446   CO2 29 10/21/2017 1446   CO2 20 (L) 01/10/2017 1045   GLUCOSE 120 (H) 10/21/2017 1446   GLUCOSE 103 01/10/2017 1045   BUN 22 10/21/2017 1446   BUN 27.6 (H) 01/10/2017 1045   CREATININE 1.11 (H) 10/21/2017 1446   CREATININE 1.4 (H) 01/10/2017 1045   CALCIUM 10.4 (H) 10/21/2017 1446   CALCIUM 9.7 01/10/2017 1045   PROT 7.8 10/21/2017 1446   PROT 6.7 01/10/2017 1045   ALBUMIN 4.0 10/21/2017 1446   ALBUMIN 3.5 01/10/2017 1045   AST 14 (L) 10/21/2017 1446   AST 10 01/10/2017 1045   ALT 10 10/21/2017 1446   ALT 11 01/10/2017 1045   ALKPHOS 83 10/21/2017 1446   ALKPHOS 77 01/10/2017 1045   BILITOT 0.3 10/21/2017 1446   BILITOT 0.34 01/10/2017 1045   GFRNONAA 48 (L) 10/21/2017 1446   GFRAA 55 (L) 10/21/2017 1446   Lab Results  Component Value Date   WBC 6.6 10/21/2017   NEUTROABS 5.1 10/21/2017   HGB 12.2 10/21/2017   HCT 37.2 10/21/2017   MCV 88.8 10/21/2017   PLT 236 10/21/2017    Component     Latest Ref Rng & Units 10/21/2017  [facility] test code      SEND-OUTS  [facility] test name      SEND-OUTS  Misc [facility] result      COMMENT  CRP     <1.0 mg/dL 1.1 (H)  Sed Rate     0 - 22 mm/hr 65 (H)  Copper     72 - 166 ug/dL 127  Magnesium     1.7 - 2.4 mg/dL 1.9    Assessment/Plan 1. [place]  Ms. Martinovich is clinically stable today.  Her movements have improved considerably.  Etiology is still uncertain.  We recommended decreasing decadron to 0.5mg  daily for two weeks, then stopping therapy.   She will follow up with [place] as needed.  We appreciate the opportunity to participate in the care of [name].    All questions were answered. The patient knows to call  the clinic with any problems, questions or concerns. No barriers to learning were detected.  The total time spent in the encounter was 25 minutes and more than 50% was on counseling and review of test results   Ventura Sellers, MD Medical Director of  Neuro-Oncology [facility] 03/19/18 12:12 PM

## 2018-03-20 ENCOUNTER — Telehealth: Payer: Self-pay | Admitting: Internal Medicine

## 2018-03-20 NOTE — Telephone Encounter (Signed)
No 3/5 los

## 2018-03-22 ENCOUNTER — Other Ambulatory Visit: Payer: Self-pay | Admitting: Family Medicine

## 2018-03-22 DIAGNOSIS — F339 Major depressive disorder, recurrent, unspecified: Secondary | ICD-10-CM

## 2018-04-17 ENCOUNTER — Telehealth: Payer: Self-pay | Admitting: Medical Oncology

## 2018-04-17 NOTE — Telephone Encounter (Signed)
Pt wants CT scan and labs done at [facility] on 4/20.

## 2018-04-19 ENCOUNTER — Other Ambulatory Visit: Payer: Self-pay | Admitting: Internal Medicine

## 2018-04-19 DIAGNOSIS — C3411 Malignant neoplasm of upper lobe, right bronchus or lung: Secondary | ICD-10-CM

## 2018-04-19 NOTE — Telephone Encounter (Signed)
I ordered the new scan. Lab can be done any center with no need to change except for [facility].

## 2018-04-20 NOTE — Telephone Encounter (Signed)
Pt wants  ct scan done at [facility] for may  20

## 2018-05-04 ENCOUNTER — Other Ambulatory Visit: Payer: Self-pay

## 2018-05-06 ENCOUNTER — Ambulatory Visit: Payer: Self-pay | Admitting: Internal Medicine

## 2018-05-08 ENCOUNTER — Ambulatory Visit: Payer: Self-pay | Admitting: Internal Medicine

## 2018-05-28 ENCOUNTER — Other Ambulatory Visit: Payer: Self-pay | Admitting: Radiation Therapy

## 2018-05-28 DIAGNOSIS — C7931 Secondary malignant neoplasm of brain: Secondary | ICD-10-CM

## 2018-05-28 DIAGNOSIS — C7949 Secondary malignant neoplasm of other parts of nervous system: Secondary | ICD-10-CM

## 2018-06-01 ENCOUNTER — Telehealth: Payer: Self-pay | Admitting: Hematology & Oncology

## 2018-06-01 NOTE — Telephone Encounter (Signed)
Patient called and requested to have appts for 5/20 moved to 5/22.  I sent IB message to [facility] to call and reschedule.  I will move lab appt after CT appt has been moved.

## 2018-06-02 ENCOUNTER — Telehealth: Payer: Self-pay | Admitting: Radiation Therapy

## 2018-06-02 ENCOUNTER — Telehealth: Payer: Self-pay | Admitting: *Deleted

## 2018-06-02 NOTE — Telephone Encounter (Signed)
Notified Robyn Barton that her visit with Dr. [name] would be by phone on 06/09/18. Robyn Barton voiced understanding

## 2018-06-02 NOTE — Telephone Encounter (Signed)
Spoke with Ms. Eckmann about her July follow-up brain MRI and visit with Dr. Isidore Moos. She  Mentioned that she no longer drives and it is difficult for her to get to [place] since she lives in [place], but that these visits are far enough away that she should be able to arrange transportation.   Mont Dutton R.T.(R)(T) Special Procedures Navigator

## 2018-06-02 NOTE — Telephone Encounter (Signed)
Received vm message from pt requesting that her appt with Dr. [name] on 06/09/18 be changed to a phone visit instead of her coming in person.  Due to COVID 19 pandemic, pt prefers to limit the times she leaves her home.  Pt has labs and CT scan @ [facility] on Friday, 06/05/18  Please advise

## 2018-06-02 NOTE — Telephone Encounter (Signed)
Ok

## 2018-06-03 ENCOUNTER — Inpatient Hospital Stay: Payer: Medicare Other

## 2018-06-03 ENCOUNTER — Ambulatory Visit (HOSPITAL_BASED_OUTPATIENT_CLINIC_OR_DEPARTMENT_OTHER): Payer: Medicare Other

## 2018-06-05 ENCOUNTER — Encounter (HOSPITAL_BASED_OUTPATIENT_CLINIC_OR_DEPARTMENT_OTHER): Payer: Self-pay

## 2018-06-05 ENCOUNTER — Other Ambulatory Visit: Payer: Self-pay

## 2018-06-05 ENCOUNTER — Inpatient Hospital Stay: Payer: Medicare Other | Attending: Radiation Oncology

## 2018-06-05 ENCOUNTER — Ambulatory Visit (HOSPITAL_BASED_OUTPATIENT_CLINIC_OR_DEPARTMENT_OTHER)
Admission: RE | Admit: 2018-06-05 | Discharge: 2018-06-05 | Disposition: A | Discharge status: Home / Self Care | Payer: Medicare Other | Source: Ambulatory Visit | Attending: Internal Medicine | Admitting: Internal Medicine

## 2018-06-05 DIAGNOSIS — C3411 Malignant neoplasm of upper lobe, right bronchus or lung: Secondary | ICD-10-CM

## 2018-06-05 DIAGNOSIS — Z9221 Personal history of antineoplastic chemotherapy: Secondary | ICD-10-CM | POA: Insufficient documentation

## 2018-06-05 DIAGNOSIS — Z79899 Other long term (current) drug therapy: Secondary | ICD-10-CM | POA: Insufficient documentation

## 2018-06-05 DIAGNOSIS — K219 Gastro-esophageal reflux disease without esophagitis: Secondary | ICD-10-CM | POA: Insufficient documentation

## 2018-06-05 DIAGNOSIS — Z791 Long term (current) use of non-steroidal anti-inflammatories (NSAID): Secondary | ICD-10-CM | POA: Insufficient documentation

## 2018-06-05 DIAGNOSIS — Z923 Personal history of irradiation: Secondary | ICD-10-CM | POA: Insufficient documentation

## 2018-06-05 DIAGNOSIS — J439 Emphysema, unspecified: Secondary | ICD-10-CM | POA: Insufficient documentation

## 2018-06-05 DIAGNOSIS — R531 Weakness: Secondary | ICD-10-CM | POA: Insufficient documentation

## 2018-06-05 DIAGNOSIS — Z7951 Long term (current) use of inhaled steroids: Secondary | ICD-10-CM | POA: Insufficient documentation

## 2018-06-05 DIAGNOSIS — R5383 Other fatigue: Secondary | ICD-10-CM | POA: Insufficient documentation

## 2018-06-05 LAB — CBC WITH DIFFERENTIAL (CANCER CENTER ONLY)
Abs Immature Granulocytes: 0.02 10*3/uL (ref 0.00–0.07)
Basophils Absolute: 0 10*3/uL (ref 0.0–0.1)
Basophils Relative: 0 %
Eosinophils Absolute: 0.1 10*3/uL (ref 0.0–0.5)
Eosinophils Relative: 2 %
HCT: 40.5 % (ref 36.0–46.0)
Hemoglobin: 13 g/dL (ref 12.0–15.0)
Immature Granulocytes: 0 %
Lymphocytes Relative: 19 %
Lymphs Abs: 1.1 10*3/uL (ref 0.7–4.0)
MCH: 29.5 pg (ref 26.0–34.0)
MCHC: 32.1 g/dL (ref 30.0–36.0)
MCV: 92 fL (ref 80.0–100.0)
Monocytes Absolute: 0.6 10*3/uL (ref 0.1–1.0)
Monocytes Relative: 10 %
Neutro Abs: 3.9 10*3/uL (ref 1.7–7.7)
Neutrophils Relative %: 69 %
Platelet Count: 205 10*3/uL (ref 150–400)
RBC: 4.4 MIL/uL (ref 3.87–5.11)
RDW: 14 % (ref 11.5–15.5)
WBC Count: 5.7 10*3/uL (ref 4.0–10.5)
nRBC: 0 % (ref 0.0–0.2)

## 2018-06-05 LAB — CMP (CANCER CENTER ONLY)
ALT: 10 U/L (ref 0–44)
AST: 15 U/L (ref 15–41)
Albumin: 4.3 g/dL (ref 3.5–5.0)
Alkaline Phosphatase: 84 U/L (ref 38–126)
Anion gap: 7 (ref 5–15)
BUN: 23 mg/dL (ref 8–23)
CO2: 28 mmol/L (ref 22–32)
Calcium: 9.6 mg/dL (ref 8.9–10.3)
Chloride: 103 mmol/L (ref 98–111)
Creatinine: 1.1 mg/dL — ABNORMAL HIGH (ref 0.44–1.00)
GFR, Est AFR Am: 57 mL/min — ABNORMAL LOW (ref >60)
GFR, Estimated: 49 mL/min — ABNORMAL LOW (ref >60)
Glucose, Bld: 92 mg/dL (ref 70–99)
Potassium: 4.3 mmol/L (ref 3.5–5.1)
Sodium: 138 mmol/L (ref 135–145)
Total Bilirubin: 0.3 mg/dL (ref 0.3–1.2)
Total Protein: 6.8 g/dL (ref 6.5–8.1)

## 2018-06-05 MED ORDER — IOHEXOL 300 MG/ML  SOLN
100.0000 mL | Freq: Once | INTRAMUSCULAR | Status: AC | PRN
  Administered 2018-06-05: 11:00:00 80 mL via INTRAVENOUS

## 2018-06-09 ENCOUNTER — Encounter: Payer: Self-pay | Admitting: Internal Medicine

## 2018-06-09 ENCOUNTER — Inpatient Hospital Stay (HOSPITAL_BASED_OUTPATIENT_CLINIC_OR_DEPARTMENT_OTHER): Payer: Medicare Other | Admitting: Internal Medicine

## 2018-06-09 DIAGNOSIS — C3411 Malignant neoplasm of upper lobe, right bronchus or lung: Secondary | ICD-10-CM

## 2018-06-09 NOTE — Progress Notes (Signed)
[facility] Telephone:([phone]   Fax:([phone]  PROGRESS NOTE FOR TELEMEDICINE VISITS  [name], MD [address] [place] [address]  I connected [phone] on 06/09/18 at 11:45 AM EDT by telephone visit and verified that I am speaking with the correct person using two identifiers.   I discussed the limitations, risks, security and privacy concerns of performing an evaluation and management service by telemedicine and the availability of in-person appointments. I also discussed with the patient that there may be a patient responsible charge related to this service. The patient expressed understanding and agreed to proceed.  Other persons participating in the visit and their role in the encounter:  None  Patient's location:  Home Provider's location: [facility] [place].  DIAGNOSIS: Recurrent small cell lung cancer initially diagnosed as Limited stage (T1a, N0, M0) small cell lung cancer diagnosed in June 2018.  PRIOR THERAPY:  1) right upper lobectomy on 06/26/2016 and the final pathology was consistent with small cell lung cancer measuring 1.6 cm in size. 2) systemic chemotherapy with cisplatin 60 MG/M2 on day 1 and etoposide 120 MG/M2 on days 1, 2 and 3 every 3 weeks.  Status post 4 cycles.  This was concurrent with radiation. 3) prophylactic cranial irradiation under the care of Dr. Isidore Moos.  CURRENT THERAPY: Observation.  INTERVAL HISTORY: Robyn Barton 73 y.o. female has a telephone virtual visit with me today for evaluation and discussion of HIDA scan results.  The patient is feeling fine today with no concerning complaints except for the generalized fatigue and weakness.  She denied having any chest pain, shortness of breath, cough or hemoptysis.  She denied having any fever or chills.  She has no nausea, vomiting, diarrhea or constipation.  She had repeat CT scan of the chest performed recently and we are having the visit for discussion  of her scan results and recommendation regarding her condition.  MEDICAL HISTORY: Past Medical History:  Diagnosis Date   Allergic rhinitis    Anxiety    Elevated TSH    pt denies   Emphysema lung ([facility])    Enlarged lymph nodes    in chest   GERD (gastroesophageal reflux disease)    Goals of care, counseling/discussion 10/21/2016   History of hiatal hernia    History of radiation therapy 03/10/17- 03/21/17   Whole brain, prophylactic, radiation 25 Gy in 10 fractions   History of radiation therapy 11/18/16- 01/16/17   59.4 Gy to lung in 33 fractions.   Hyperlipidemia    IFG (impaired fasting glucose)    Osteopenia    Recurrent major depressive disorder ([facility])    Sleep apnea    mild no cpap needed   Small cell carcinoma of right lung ([place]) 2018   surgery right middle lobe removed    Tobacco dependence    Varicose veins of both lower extremities    Vitamin D deficiency     ALLERGIES:  has No Known Allergies.  MEDICATIONS:  Current Outpatient Medications  Medication Sig Dispense Refill   albuterol (PROVENTIL HFA;VENTOLIN HFA) 108 (90 Base) MCG/ACT inhaler Inhale 2 puffs into the lungs every 6 (six) hours as needed for wheezing or shortness of breath. 1 Inhaler 3   calcium carbonate (TUMS - DOSED IN MG ELEMENTAL CALCIUM) 500 MG chewable tablet Chew 1 tablet daily as needed by mouth for indigestion or heartburn.     dexamethasone (DECADRON) 2 MG tablet Take 1 tablet (2 mg total) by mouth daily.  30 tablet 3   diphenhydramine-acetaminophen (TYLENOL PM) 25-500 MG TABS tablet Take 1 tablet at bedtime as needed by mouth (sleep).     guaiFENesin (MUCINEX) 600 MG 12 hr tablet Take 600 mg by mouth 2 (two) times daily as needed for cough or to loosen phlegm.     Ibuprofen-diphenhydrAMINE Cit (MOTRIN PM PO) Take by mouth.     loratadine (CLARITIN) 10 MG tablet Take 10 mg by mouth daily as needed (hay fever).     meloxicam (MOBIC) 7.5 MG tablet Take 7.5 mg by mouth  daily.     omeprazole (PRILOSEC) 40 MG capsule Take 40 mg by mouth daily.   3   ondansetron (ZOFRAN) 8 MG tablet TAKE 1 TABLET (8 MG TOTAL) BY MOUTH EVERY 8 (EIGHT) HOURS AS NEEDED FOR UP TO 7 DAYS FOR NAUSEA.  2   polyethylene glycol (MIRALAX / GLYCOLAX) packet Take 17 g by mouth daily.     sertraline (ZOLOFT) 100 MG tablet TAKE 1 TABLET BY MOUTH  DAILY 90 tablet 3   tiotropium (SPIRIVA HANDIHALER) 18 MCG inhalation capsule Place 1 capsule (18 mcg total) into inhaler and inhale daily. 30 capsule 3   No current facility-administered medications for this visit.     SURGICAL HISTORY:  Past Surgical History:  Procedure Laterality Date   ABDOMINAL HYSTERECTOMY     1 ovary left   ANTERIOR APPROACH HEMI HIP ARTHROPLASTY Left 08/12/2017   Procedure: ANTERIOR APPROACH HEMI HIP ARTHROPLASTY;  Surgeon: Leandrew Koyanagi, MD;  Location: [facility];  Service: Orthopedics;  Laterality: Left;   ENDOBRONCHIAL ULTRASOUND Bilateral 10/15/2016   Procedure: ENDOBRONCHIAL ULTRASOUND;  Surgeon: Collene Gobble, MD;  Location: [facility];  Service: Cardiopulmonary;  Laterality: Bilateral;   INCONTINENCE SURGERY     bladder sling   right middle lobe lung surgery  06/26/2016   [facility]    REVIEW OF SYSTEMS:  A comprehensive review of systems was negative except for: Constitutional: positive for fatigue Musculoskeletal: positive for muscle weakness   LABORATORY DATA: Lab Results  Component Value Date   WBC 5.7 06/05/2018   HGB 13.0 06/05/2018   HCT 40.5 06/05/2018   MCV 92.0 06/05/2018   PLT 205 06/05/2018      Chemistry      Component Value Date/Time   NA 138 06/05/2018 0958   NA 135 (L) 01/10/2017 1045   K 4.3 06/05/2018 0958   K 4.9 01/10/2017 1045   CL 103 06/05/2018 0958   CO2 28 06/05/2018 0958   CO2 20 (L) 01/10/2017 1045   BUN 23 06/05/2018 0958   BUN 27.6 (H) 01/10/2017 1045   CREATININE 1.10 (H) 06/05/2018 0958   CREATININE 1.4 (H) 01/10/2017 1045      Component Value Date/Time     CALCIUM 9.6 06/05/2018 0958   CALCIUM 9.7 01/10/2017 1045   ALKPHOS 84 06/05/2018 0958   ALKPHOS 77 01/10/2017 1045   AST 15 06/05/2018 0958   AST 10 01/10/2017 1045   ALT 10 06/05/2018 0958   ALT 11 01/10/2017 1045   BILITOT 0.3 06/05/2018 0958   BILITOT 0.34 01/10/2017 1045       RADIOGRAPHIC STUDIES: Ct Chest W Contrast  Result Date: 06/05/2018 CLINICAL DATA:  Extensive stage small cell lung cancer, status post chemotherapy and right upper lobectomy EXAM: CT CHEST WITH CONTRAST TECHNIQUE: Multidetector CT imaging of the chest was performed during intravenous contrast administration. CONTRAST:  72mL OMNIPAQUE IOHEXOL 300 MG/ML  SOLN COMPARISON:  11/03/2017 FINDINGS: Cardiovascular: Heart is normal in  size. Rightward cardiomediastinal shift. No pericardial effusion. No evidence of thoracic aortic aneurysm. Atherosclerotic calcifications of the aortic arch. Mild coronary atherosclerosis of the LAD. Mediastinum/Nodes: No suspicious mediastinal lymphadenopathy. Visualized thyroid is unremarkable. Lungs/Pleura: Status post right middle lobectomy. Radiation changes in the medial right hemithorax. Biapical pleural-parenchymal scarring. Mild paraseptal emphysematous changes, left upper lobe predominant. No suspicious pulmonary nodules. No focal consolidation. No pleural effusion or pneumothorax. Upper Abdomen: Visualized upper abdomen is grossly unremarkable. Musculoskeletal: Mild degenerative changes of the visualized thoracolumbar spine. IMPRESSION: Status post right middle lobectomy with radiation changes in the medial right hemithorax. No evidence of recurrent or metastatic disease. Aortic Atherosclerosis (ICD10-I70.0) and Emphysema (ICD10-J43.9). Electronically Signed   By: Julian Hy M.D.   On: 06/05/2018 15:02    ASSESSMENT AND PLAN:  This is a very pleasant 73 years old white female with history of limited stage small cell lung cancer status post left upper lobectomy in June 2018.   The patient had evidence for disease recurrence with hypermetabolic subcarinal lymphadenopathy that was biopsy-proven to be small cell carcinoma. She completed systemic chemotherapy with cisplatin and etoposide.  She is status post 4 cycles.  This is concurrent with radiotherapy.   She also underwent prophylactic cranial irradiation completed in early March 2019. The patient has been on observation since that time with no concerning complaints except for the generalized fatigue and weakness. She had repeat CT scan of the chest performed recently.  I personally and independently reviewed the scans and discussed the results with the patient today. Her scan showed no concerning findings for disease progression. I recommended for the patient to continue on observation with repeat CT scan of the chest in 6 months. She was advised to call immediately if she has any concerning symptoms in the interval. I discussed the assessment and treatment plan with the patient. The patient was provided an opportunity to ask questions and all were answered. The patient agreed with the plan and demonstrated an understanding of the instructions.   The patient was advised to call back or seek an in-person evaluation if the symptoms worsen or if the condition fails to improve as anticipated.  I provided 11 minutes of non face-to-face telephone visit time during this encounter, and > 50% was spent counseling as documented under my assessment & plan.  Eilleen Kempf, MD 06/09/2018 11:35 AM  Disclaimer: This note was dictated with voice recognition software. Similar sounding words can inadvertently be transcribed and may not be corrected upon review.

## 2018-06-11 ENCOUNTER — Telehealth: Payer: Self-pay | Admitting: Internal Medicine

## 2018-06-11 NOTE — Telephone Encounter (Signed)
Scheduled appt per 5/26 los - reminder letter mailed with appt date and time . F/u in 6 months

## 2018-07-27 ENCOUNTER — Telehealth: Payer: Self-pay | Admitting: *Deleted

## 2018-07-27 NOTE — Telephone Encounter (Signed)
Called patient to alter fu on 08-04-18 due to patient having moved her scan , appt. rescheduled for 09-04-18 @ 11:40 am, patient's telephone fu couldn't be any earlier due to Dr. Isidore Moos being on vacation or her scheduled being booked, patient verified understanding this

## 2018-07-28 ENCOUNTER — Telehealth: Payer: Self-pay | Admitting: *Deleted

## 2018-07-28 NOTE — Telephone Encounter (Signed)
Called patient to ask about Dr. Isidore Moos calling her on the telephone on 08-12-18 with MRI results, I did tell her per Dr. Isidore Moos request that Dr. Isidore Moos could be running a little bit late that day doing the telephone fu, patient agreed to this call and she verified understanding that the Dr. could be running a little bit late

## 2018-07-30 ENCOUNTER — Other Ambulatory Visit: Payer: Medicare Other

## 2018-08-04 ENCOUNTER — Ambulatory Visit: Payer: Self-pay | Admitting: Radiation Oncology

## 2018-08-05 ENCOUNTER — Other Ambulatory Visit: Payer: Self-pay | Admitting: Radiation Therapy

## 2018-08-11 ENCOUNTER — Other Ambulatory Visit: Payer: Self-pay

## 2018-08-11 ENCOUNTER — Ambulatory Visit
Admission: RE | Admit: 2018-08-11 | Discharge: 2018-08-11 | Disposition: A | Discharge status: Home / Self Care | Payer: Medicare Other | Source: Ambulatory Visit | Attending: Radiation Oncology | Admitting: Radiation Oncology

## 2018-08-11 DIAGNOSIS — C7931 Secondary malignant neoplasm of brain: Secondary | ICD-10-CM

## 2018-08-11 DIAGNOSIS — C7949 Secondary malignant neoplasm of other parts of nervous system: Secondary | ICD-10-CM

## 2018-08-11 MED ORDER — GADOBENATE DIMEGLUMINE 529 MG/ML IV SOLN
6.0000 mL | Freq: Once | INTRAVENOUS | Status: AC | PRN
  Administered 2018-08-11: 6 mL via INTRAVENOUS

## 2018-08-12 ENCOUNTER — Ambulatory Visit
Admission: RE | Admit: 2018-08-12 | Discharge: 2018-08-12 | Disposition: A | Discharge status: Home / Self Care | Payer: Medicare Other | Source: Ambulatory Visit | Attending: Radiation Oncology | Admitting: Radiation Oncology

## 2018-08-12 ENCOUNTER — Encounter: Payer: Self-pay | Admitting: Radiation Oncology

## 2018-08-12 DIAGNOSIS — C3411 Malignant neoplasm of upper lobe, right bronchus or lung: Secondary | ICD-10-CM

## 2018-08-12 NOTE — Progress Notes (Signed)
Radiation Oncology         [phone]) [phone] ________________________________  Name: Robyn Barton MRN: [id number]  Date: 08/12/2018  DOB: Jan 29, 1945  Follow-Up Visit Note (by phone due to pandemic precautions, as patient could not access WebEx)  Outpatient  CC: [name], MD  [name], MD  Diagnosis and Prior Radiotherapy:    ICD-10-CM   1. Small cell lung cancer, right upper lobe (HCC)  C34.11    Small cell lung cancer responsive to ChRT - risk of brain metastases  03/10/17-03/21/17: Whole Brain PCI / 25 Gy in 10 fractions, Isodose plan/ 6X 11/18/16-01/16/17: 59.4 Gy in 33 fractions to the right upper lobe lung  CHIEF COMPLAINT: Here for follow-up and surveillance of lung cancer   Narrative: She reports she is still restoring her strength.  She has no new complaints.   When she followed up with medical oncology 2 months ago she had no evidence of disease according to her systemic imaging  ALLERGIES:  has No Known Allergies.  Meds: Current Outpatient Medications  Medication Sig Dispense Refill  . albuterol (PROVENTIL HFA;VENTOLIN HFA) 108 (90 Base) MCG/ACT inhaler Inhale 2 puffs into the lungs every 6 (six) hours as needed for wheezing or shortness of breath. 1 Inhaler 3  . calcium carbonate (TUMS - DOSED IN MG ELEMENTAL CALCIUM) 500 MG chewable tablet Chew 1 tablet daily as needed by mouth for indigestion or heartburn.    . dexamethasone (DECADRON) 2 MG tablet Take 1 tablet (2 mg total) by mouth daily. 30 tablet 3  . diphenhydramine-acetaminophen (TYLENOL PM) 25-500 MG TABS tablet Take 1 tablet at bedtime as needed by mouth (sleep).    Marland Kitchen guaiFENesin (MUCINEX) 600 MG 12 hr tablet Take 600 mg by mouth 2 (two) times daily as needed for cough or to loosen phlegm.    . Ibuprofen-diphenhydrAMINE Cit (MOTRIN PM PO) Take by mouth.    . loratadine (CLARITIN) 10 MG tablet Take 10 mg by mouth daily as needed (hay fever).    . meloxicam (MOBIC) 7.5 MG tablet Take 7.5 mg by  mouth daily.    Marland Kitchen omeprazole (PRILOSEC) 40 MG capsule Take 40 mg by mouth daily.   3  . ondansetron (ZOFRAN) 8 MG tablet TAKE 1 TABLET (8 MG TOTAL) BY MOUTH EVERY 8 (EIGHT) HOURS AS NEEDED FOR UP TO 7 DAYS FOR NAUSEA.  2  . polyethylene glycol (MIRALAX / GLYCOLAX) packet Take 17 g by mouth daily.    . sertraline (ZOLOFT) 100 MG tablet TAKE 1 TABLET BY MOUTH  DAILY 90 tablet 3  . tiotropium (SPIRIVA HANDIHALER) 18 MCG inhalation capsule Place 1 capsule (18 mcg total) into inhaler and inhale daily. 30 capsule 3   No current facility-administered medications for this encounter.     Physical Findings:  vitals were not taken for this visit.    General: Alert and oriented, in no acute distress.   Lab Findings: Lab Results  Component Value Date   WBC 5.7 06/05/2018   HGB 13.0 06/05/2018   HCT 40.5 06/05/2018   MCV 92.0 06/05/2018   PLT 205 06/05/2018    Radiographic Findings: Mr Jeri Cos EZ Contrast  Result Date: 08/11/2018 CLINICAL DATA:  History of lung cancer with whole-brain radiation. Creatinine was obtained on site at [facility] at [address] Results: Creatinine 1.2 mg/dL. EXAM: MRI HEAD WITHOUT AND WITH CONTRAST TECHNIQUE: Multiplanar, multiecho pulse sequences of the brain and surrounding structures were obtained without and with intravenous contrast. CONTRAST:  69mL MULTIHANCE GADOBENATE DIMEGLUMINE 529 MG/ML IV SOLN COMPARISON:  MRI head 01/29/2018 FINDINGS: Brain: Generalized atrophy. Stable ventricle size. Extensive confluent hyperintensity throughout the cerebral white matter in both hemispheres is stable. Patchy hyperintensity in the pons bilaterally stable. Negative for hemorrhage. No acute infarct. No enhancing metastatic deposits are identified postcontrast administration. Vascular: Normal arterial flow voids Skull and upper cervical spine: No concerning skull lesion. Sinuses/Orbits: Negative Other: None IMPRESSION: Stable MRI.  No evidence of metastatic disease  Diffuse white matter changes most compatible with prior radiation. Electronically Signed   By: Franchot Gallo M.D.   On: 08/11/2018 15:38    Impression/Plan:  Small cell lung cancer, status post PCI to the brain, also prior RT to lung by Dr Tammi Klippel. NED.  We will follow up with next brain MRI in 6 months.  This encounter was provided by telemedicine platform phone as the patient could not access WebEx The patient has given verbal consent for this type of encounter and has been advised to only accept a meeting of this type in a secure network environment. The time spent during this encounter was 5 minutes. The attendants for this meeting include Eppie Gibson  and [name].  During the encounter, Eppie Gibson was located at [facility] Radiation Oncology Department.  [name] was located at home.   _____________________________________   Eppie Gibson, MD

## 2018-09-04 ENCOUNTER — Ambulatory Visit: Payer: Medicare Other | Admitting: Radiation Oncology

## 2018-11-03 ENCOUNTER — Other Ambulatory Visit: Payer: Self-pay | Admitting: Radiation Therapy

## 2018-11-03 DIAGNOSIS — C7931 Secondary malignant neoplasm of brain: Secondary | ICD-10-CM

## 2018-11-03 DIAGNOSIS — C7949 Secondary malignant neoplasm of other parts of nervous system: Secondary | ICD-10-CM

## 2018-11-30 ENCOUNTER — Telehealth: Payer: Self-pay | Admitting: *Deleted

## 2018-11-30 NOTE — Telephone Encounter (Signed)
ok 

## 2018-11-30 NOTE — Telephone Encounter (Signed)
Message sent to scheduler. 

## 2018-11-30 NOTE — Telephone Encounter (Signed)
Pt left a message stating she does not drive anymore.   Would like to have labs on 11/23 drawn at [facility] and then do phone visit with Dr [name] on 11/25.  Please advise

## 2018-12-01 ENCOUNTER — Telehealth: Payer: Self-pay | Admitting: Internal Medicine

## 2018-12-01 NOTE — Telephone Encounter (Signed)
I called and advised patient of lab appointment that was now scheduled at [facility] as requested per 11/17 staff message

## 2018-12-07 ENCOUNTER — Telehealth: Payer: Self-pay | Admitting: Internal Medicine

## 2018-12-07 ENCOUNTER — Inpatient Hospital Stay: Payer: Medicare Other

## 2018-12-07 ENCOUNTER — Other Ambulatory Visit: Payer: Self-pay

## 2018-12-07 ENCOUNTER — Inpatient Hospital Stay: Payer: Medicare Other | Attending: Internal Medicine

## 2018-12-07 ENCOUNTER — Telehealth: Payer: Self-pay | Admitting: *Deleted

## 2018-12-07 ENCOUNTER — Other Ambulatory Visit: Payer: Self-pay | Admitting: *Deleted

## 2018-12-07 ENCOUNTER — Other Ambulatory Visit: Payer: Medicare Other

## 2018-12-07 DIAGNOSIS — C3411 Malignant neoplasm of upper lobe, right bronchus or lung: Secondary | ICD-10-CM

## 2018-12-07 LAB — CMP (CANCER CENTER ONLY)
ALT: 14 U/L (ref 0–44)
AST: 13 U/L — ABNORMAL LOW (ref 15–41)
Albumin: 4.7 g/dL (ref 3.5–5.0)
Alkaline Phosphatase: 56 U/L (ref 38–126)
Anion gap: 7 (ref 5–15)
BUN: 35 mg/dL — ABNORMAL HIGH (ref 8–23)
CO2: 30 mmol/L (ref 22–32)
Calcium: 10.4 mg/dL — ABNORMAL HIGH (ref 8.9–10.3)
Chloride: 103 mmol/L (ref 98–111)
Creatinine: 1.26 mg/dL — ABNORMAL HIGH (ref 0.44–1.00)
GFR, Est AFR Am: 48 mL/min — ABNORMAL LOW (ref >60)
GFR, Estimated: 42 mL/min — ABNORMAL LOW (ref >60)
Glucose, Bld: 96 mg/dL (ref 70–99)
Potassium: 6.4 mmol/L (ref 3.5–5.1)
Sodium: 140 mmol/L (ref 135–145)
Total Bilirubin: 0.4 mg/dL (ref 0.3–1.2)
Total Protein: 7.7 g/dL (ref 6.5–8.1)

## 2018-12-07 LAB — CBC WITH DIFFERENTIAL (CANCER CENTER ONLY)
Abs Immature Granulocytes: 0.12 10*3/uL — ABNORMAL HIGH (ref 0.00–0.07)
Basophils Absolute: 0 10*3/uL (ref 0.0–0.1)
Basophils Relative: 0 %
Eosinophils Absolute: 0.1 10*3/uL (ref 0.0–0.5)
Eosinophils Relative: 1 %
HCT: 42 % (ref 36.0–46.0)
Hemoglobin: 13.6 g/dL (ref 12.0–15.0)
Immature Granulocytes: 1 %
Lymphocytes Relative: 17 %
Lymphs Abs: 2 10*3/uL (ref 0.7–4.0)
MCH: 30.5 pg (ref 26.0–34.0)
MCHC: 32.4 g/dL (ref 30.0–36.0)
MCV: 94.2 fL (ref 80.0–100.0)
Monocytes Absolute: 0.9 10*3/uL (ref 0.1–1.0)
Monocytes Relative: 8 %
Neutro Abs: 8.6 10*3/uL — ABNORMAL HIGH (ref 1.7–7.7)
Neutrophils Relative %: 73 %
Platelet Count: 242 10*3/uL (ref 150–400)
RBC: 4.46 MIL/uL (ref 3.87–5.11)
RDW: 15.1 % (ref 11.5–15.5)
WBC Count: 11.6 10*3/uL — ABNORMAL HIGH (ref 4.0–10.5)
nRBC: 0 % (ref 0.0–0.2)

## 2018-12-07 LAB — BASIC METABOLIC PANEL - CANCER CENTER ONLY
Anion gap: 8 (ref 5–15)
BUN: 36 mg/dL — ABNORMAL HIGH (ref 8–23)
CO2: 30 mmol/L (ref 22–32)
Calcium: 10.3 mg/dL (ref 8.9–10.3)
Chloride: 101 mmol/L (ref 98–111)
Creatinine: 1.27 mg/dL — ABNORMAL HIGH (ref 0.44–1.00)
GFR, Est AFR Am: 48 mL/min — ABNORMAL LOW (ref >60)
GFR, Estimated: 41 mL/min — ABNORMAL LOW (ref >60)
Glucose, Bld: 127 mg/dL — ABNORMAL HIGH (ref 70–99)
Potassium: 5.9 mmol/L — ABNORMAL HIGH (ref 3.5–5.1)
Sodium: 139 mmol/L (ref 135–145)

## 2018-12-07 MED ORDER — SODIUM POLYSTYRENE SULFONATE PO POWD: Freq: Once | ORAL | 0 refills | Status: AC

## 2018-12-07 NOTE — Telephone Encounter (Signed)
Dr. [name] notified of potassium of 6.4.  Order received for pt to come back to have labs repeated.  Call placed to patient to inform her of MD orders.  Pt states that she will come back now to have labs repeated.

## 2018-12-07 NOTE — Telephone Encounter (Signed)
Unable to reach pt, lmovm pt's K+ 5.9 and reviewed by Dr. [name]. LMOVM with instructions for pt to pick up rx at [facility], this is a one time dose to help with pt's K+ level. Pt instructed to call [facility] office to confirm this message was received.

## 2018-12-09 ENCOUNTER — Inpatient Hospital Stay (HOSPITAL_BASED_OUTPATIENT_CLINIC_OR_DEPARTMENT_OTHER): Payer: Medicare Other | Admitting: Internal Medicine

## 2018-12-09 ENCOUNTER — Encounter: Payer: Self-pay | Admitting: Internal Medicine

## 2018-12-09 DIAGNOSIS — C3411 Malignant neoplasm of upper lobe, right bronchus or lung: Secondary | ICD-10-CM | POA: Diagnosis not present

## 2018-12-09 DIAGNOSIS — C3490 Malignant neoplasm of unspecified part of unspecified bronchus or lung: Secondary | ICD-10-CM | POA: Diagnosis not present

## 2018-12-09 NOTE — Progress Notes (Signed)
[place] Telephone:([phone]) [phone]   Fax:(336) [phone]  PROGRESS NOTE FOR TELEMEDICINE VISITS  [name], MD [place] [address]  I connected [phone] on 12/09/18 at 11:15 AM EST by telephone visit and verified that I am speaking with the correct person using two identifiers.   I discussed the limitations, risks, security and privacy concerns of performing an evaluation and management service by telemedicine and the availability of in-person appointments. I also discussed with the patient that there may be a patient responsible charge related to this service. The patient expressed understanding and agreed to proceed.  Other persons participating in the visit and their role in the encounter:  None  Patient's location:  Home Provider's location: [place].  DIAGNOSIS:Recurrent small cell lung cancer initially diagnosed as Limited stage (T1a, N0, M0) small cell lung cancer diagnosed in June 2018.  PRIOR THERAPY:  1) right upper lobectomy on 06/26/2016 and the final pathology was consistent with small cell lung cancer measuring 1.6 cm in size. 2) systemic chemotherapy with cisplatin 60 MG/M2 on day 1 and etoposide 120 MG/M2 on days 1, 2 and 3 every 3 weeks. Status post 4 cycles. This was concurrent with radiation. 3) prophylactic cranial irradiation under the care of Dr. Isidore Moos.  CURRENT THERAPY: Observation.  INTERVAL HISTORY: Robyn Barton 73 y.o. female has a telephone virtual visit with me today for evaluation and recommendation regarding her condition.  The patient is feeling fine today with no concerning complaints except for shortness of breath with exertion.  Her energy level has improved.  She denied having any current chest pain, cough or hemoptysis.  She denied having any fever or chills.  She has no nausea, vomiting, diarrhea or constipation.  She has no headache or visual changes.  She was supposed to have repeat CT  scan of the chest before this visit but unfortunately her scan was not scheduled as planned.  MEDICAL HISTORY: Past Medical History:  Diagnosis Date  . Allergic rhinitis   . Anxiety   . Elevated TSH    pt denies  . Emphysema lung ([place])   . Enlarged lymph nodes    in chest  . GERD (gastroesophageal reflux disease)   . Goals of care, counseling/discussion 10/21/2016  . History of hiatal hernia   . History of radiation therapy 03/10/17- 03/21/17   Whole brain, prophylactic, radiation 25 Gy in 10 fractions  . History of radiation therapy 11/18/16- 01/16/17   59.4 Gy to lung in 33 fractions.  . Hyperlipidemia   . IFG (impaired fasting glucose)   . Osteopenia   . Recurrent major depressive disorder ([place])   . Sleep apnea    mild no cpap needed  . Small cell carcinoma of right lung ([place]) 2018   surgery right middle lobe removed   . Tobacco dependence   . Varicose veins of both lower extremities   . Vitamin D deficiency     ALLERGIES:  has No Known Allergies.  MEDICATIONS:  Current Outpatient Medications  Medication Sig Dispense Refill  . albuterol (PROVENTIL HFA;VENTOLIN HFA) 108 (90 Base) MCG/ACT inhaler Inhale 2 puffs into the lungs every 6 (six) hours as needed for wheezing or shortness of breath. 1 Inhaler 3  . calcium carbonate (TUMS - DOSED IN MG ELEMENTAL CALCIUM) 500 MG chewable tablet Chew 1 tablet daily as needed by mouth for indigestion or heartburn.    . dexamethasone (DECADRON) 2 MG tablet Take 1 tablet (2 mg  total) by mouth daily. 30 tablet 3  . diphenhydramine-acetaminophen (TYLENOL PM) 25-500 MG TABS tablet Take 1 tablet at bedtime as needed by mouth (sleep).    Marland Kitchen guaiFENesin (MUCINEX) 600 MG 12 hr tablet Take 600 mg by mouth 2 (two) times daily as needed for cough or to loosen phlegm.    . Ibuprofen-diphenhydrAMINE Cit (MOTRIN PM PO) Take by mouth.    . loratadine (CLARITIN) 10 MG tablet Take 10 mg by mouth daily as needed (hay fever).    . meloxicam (MOBIC) 7.5 MG  tablet Take 7.5 mg by mouth daily.    Marland Kitchen omeprazole (PRILOSEC) 40 MG capsule Take 40 mg by mouth daily.   3  . ondansetron (ZOFRAN) 8 MG tablet TAKE 1 TABLET (8 MG TOTAL) BY MOUTH EVERY 8 (EIGHT) HOURS AS NEEDED FOR UP TO 7 DAYS FOR NAUSEA.  2  . polyethylene glycol (MIRALAX / GLYCOLAX) packet Take 17 g by mouth daily.    . sertraline (ZOLOFT) 100 MG tablet TAKE 1 TABLET BY MOUTH  DAILY 90 tablet 3  . tiotropium (SPIRIVA HANDIHALER) 18 MCG inhalation capsule Place 1 capsule (18 mcg total) into inhaler and inhale daily. 30 capsule 3   No current facility-administered medications for this visit.     SURGICAL HISTORY:  Past Surgical History:  Procedure Laterality Date  . ABDOMINAL HYSTERECTOMY     1 ovary left  . ANTERIOR APPROACH HEMI HIP ARTHROPLASTY Left 08/12/2017   Procedure: ANTERIOR APPROACH HEMI HIP ARTHROPLASTY;  Surgeon: Leandrew Koyanagi, MD;  Location: [facility];  Service: Orthopedics;  Laterality: Left;  . ENDOBRONCHIAL ULTRASOUND Bilateral 10/15/2016   Procedure: ENDOBRONCHIAL ULTRASOUND;  Surgeon: Collene Gobble, MD;  Location: [facility];  Service: Cardiopulmonary;  Laterality: Bilateral;  . INCONTINENCE SURGERY     bladder sling  . right middle lobe lung surgery  06/26/2016   [facility]    REVIEW OF SYSTEMS:  A comprehensive review of systems was negative except for: Constitutional: positive for fatigue Respiratory: positive for dyspnea on exertion    LABORATORY DATA: Lab Results  Component Value Date   WBC 11.6 (H) 12/07/2018   HGB 13.6 12/07/2018   HCT 42.0 12/07/2018   MCV 94.2 12/07/2018   PLT 242 12/07/2018      Chemistry      Component Value Date/Time   NA 139 12/07/2018 1213   NA 135 (L) 01/10/2017 1045   K 5.9 (H) 12/07/2018 1213   K 4.9 01/10/2017 1045   CL 101 12/07/2018 1213   CO2 30 12/07/2018 1213   CO2 20 (L) 01/10/2017 1045   BUN 36 (H) 12/07/2018 1213   BUN 27.6 (H) 01/10/2017 1045   CREATININE 1.27 (H) 12/07/2018 1213   CREATININE 1.4 (H)  01/10/2017 1045      Component Value Date/Time   CALCIUM 10.3 12/07/2018 1213   CALCIUM 9.7 01/10/2017 1045   ALKPHOS 56 12/07/2018 0941   ALKPHOS 77 01/10/2017 1045   AST 13 (L) 12/07/2018 0941   AST 10 01/10/2017 1045   ALT 14 12/07/2018 0941   ALT 11 01/10/2017 1045   BILITOT 0.4 12/07/2018 0941   BILITOT 0.34 01/10/2017 1045       RADIOGRAPHIC STUDIES: No results found.  ASSESSMENT AND PLAN: This is a very pleasant 73 years old white female with history of limited stage small cell lung cancer status post left upper lobectomy in June 2018.  The patient had evidence for disease recurrence with hypermetabolic subcarinal lymphadenopathy that was biopsy-proven to be  small cell carcinoma. She completed systemic chemotherapy with cisplatin and etoposide. She is status post 4 cycles. This is concurrent with radiotherapy.  She also underwent prophylactic cranial irradiation completed in early March 2019. She has been on observation since that time and the patient is doing fine today with no concerning complaints except for mild shortness of breath.  Her CT scan was not done as ordered.  I will arrange for the patient to have her scan of the chest performed in the next few days. If the scan showed no concerning findings for disease recurrence, I will see her back for follow-up visit in 6 months with repeat lab and CT scan of the chest. For the hyperkalemia, she was given a dose of Kayexalate and the patient was advised to decrease any potassium containing supplements. She was advised to call immediately if she has any concerning symptoms in the interval. I discussed the assessment and treatment plan with the patient. The patient was provided an opportunity to ask questions and all were answered. The patient agreed with the plan and demonstrated an understanding of the instructions.   The patient was advised to call back or seek an in-person evaluation if the symptoms worsen or if the  condition fails to improve as anticipated.  I provided 12 minutes of non face-to-face telephone visit time during this encounter, and > 50% was spent counseling as documented under my assessment & plan.  Eilleen Kempf, MD 12/09/2018 11:38 AM  Disclaimer: This note was dictated with voice recognition software. Similar sounding words can inadvertently be transcribed and may not be corrected upon review.

## 2018-12-11 ENCOUNTER — Telehealth: Payer: Self-pay | Admitting: Internal Medicine

## 2018-12-11 NOTE — Telephone Encounter (Signed)
Scheduled per los. Called and left msg. Mailed printout  °

## 2018-12-23 ENCOUNTER — Other Ambulatory Visit: Payer: Self-pay

## 2018-12-23 ENCOUNTER — Ambulatory Visit (HOSPITAL_BASED_OUTPATIENT_CLINIC_OR_DEPARTMENT_OTHER)
Admission: RE | Admit: 2018-12-23 | Discharge: 2018-12-23 | Disposition: A | Discharge status: Home / Self Care | Payer: Medicare Other | Source: Ambulatory Visit | Attending: Internal Medicine | Admitting: Internal Medicine

## 2018-12-23 DIAGNOSIS — C3490 Malignant neoplasm of unspecified part of unspecified bronchus or lung: Secondary | ICD-10-CM | POA: Insufficient documentation

## 2018-12-29 ENCOUNTER — Telehealth: Payer: Self-pay | Admitting: Medical Oncology

## 2018-12-29 NOTE — Telephone Encounter (Signed)
Requests virtual visit for ct results.

## 2018-12-29 NOTE — Telephone Encounter (Signed)
Yes.  I prefer mychart visit.

## 2018-12-30 ENCOUNTER — Telehealth: Payer: Self-pay | Admitting: Internal Medicine

## 2018-12-30 NOTE — Telephone Encounter (Signed)
Scheduled appt per 12/15 sch message - unable to reach pt . Left message with appt date and time   

## 2018-12-31 ENCOUNTER — Inpatient Hospital Stay: Payer: Medicare Other | Attending: Internal Medicine | Admitting: Internal Medicine

## 2018-12-31 ENCOUNTER — Encounter: Payer: Self-pay | Admitting: Internal Medicine

## 2018-12-31 DIAGNOSIS — M8580 Other specified disorders of bone density and structure, unspecified site: Secondary | ICD-10-CM | POA: Insufficient documentation

## 2018-12-31 DIAGNOSIS — Z79899 Other long term (current) drug therapy: Secondary | ICD-10-CM | POA: Diagnosis not present

## 2018-12-31 DIAGNOSIS — I2510 Atherosclerotic heart disease of native coronary artery without angina pectoris: Secondary | ICD-10-CM | POA: Diagnosis not present

## 2018-12-31 DIAGNOSIS — R5383 Other fatigue: Secondary | ICD-10-CM | POA: Insufficient documentation

## 2018-12-31 DIAGNOSIS — R590 Localized enlarged lymph nodes: Secondary | ICD-10-CM | POA: Diagnosis not present

## 2018-12-31 DIAGNOSIS — Z902 Acquired absence of lung [part of]: Secondary | ICD-10-CM | POA: Insufficient documentation

## 2018-12-31 DIAGNOSIS — J439 Emphysema, unspecified: Secondary | ICD-10-CM | POA: Diagnosis not present

## 2018-12-31 DIAGNOSIS — Z923 Personal history of irradiation: Secondary | ICD-10-CM | POA: Diagnosis not present

## 2018-12-31 DIAGNOSIS — I700 Atherosclerosis of aorta: Secondary | ICD-10-CM | POA: Insufficient documentation

## 2018-12-31 DIAGNOSIS — R0609 Other forms of dyspnea: Secondary | ICD-10-CM | POA: Diagnosis not present

## 2018-12-31 DIAGNOSIS — C3411 Malignant neoplasm of upper lobe, right bronchus or lung: Secondary | ICD-10-CM

## 2018-12-31 NOTE — Progress Notes (Signed)
[place] [phone]   [phone]  PROGRESS NOTE FOR TELEMEDICINE VISITS  [name], MD [place] [address]  I connected [phone] on 12/31/18 at  9:45 AM EST by video enabled telemedicine visit and verified that I am speaking with the correct person using two identifiers.   I discussed the limitations, risks, security and privacy concerns of performing an evaluation and management service by telemedicine and the availability of in-person appointments. I also discussed with the patient that there may be a patient responsible charge related to this service. The patient expressed understanding and agreed to proceed.  Other persons participating in the visit and their role in the encounter:  None  Patient's location:  Home Provider's location:  [place] small cell lung cancer initially diagnosed as Limited stage (T1a, N0, M0) small cell lung cancer diagnosed in June 2018.  PRIOR THERAPY:  1) right upper lobectomy on 06/26/2016 and the final pathology was consistent with small cell lung cancer measuring 1.6 cm in size. 2) systemic chemotherapy with cisplatin 60 MG/M2 on day 1 and etoposide 120 MG/M2 on days 1, 2 and 3 every 3 weeks. Status post 4 cycles. This was concurrent with radiation. 3) prophylactic cranial irradiation under the care of Dr. Isidore Moos.  CURRENT THERAPY: Observation.  INTERVAL HISTORY: Robyn Barton 73 y.o. female has a video virtual visit with me today for evaluation and discussion of her recent scan results.  The patient is feeling fine today with no concerning complaints except for mild fatigue.  She denied having any recent chest pain but has occasional shortness of breath with exertion with no cough or hemoptysis.  She is currently on Spiriva as well as albuterol inhalers.  The patient denied having any recent weight loss or night sweats.  She has no nausea,  vomiting, diarrhea or constipation.  She has no headache or visual changes.  She was found recently to have hyperkalemia on routine blood work and the patient was treated with Kayexalate.  She had repeat CT scan of the chest performed recently and she is here for evaluation and discussion of her scan results.  MEDICAL HISTORY: Past Medical History:  Diagnosis Date  . Allergic rhinitis   . Anxiety   . Elevated TSH    pt denies  . Emphysema lung ([place])   . Enlarged lymph nodes    in chest  . GERD (gastroesophageal reflux disease)   . Goals of care, counseling/discussion 10/21/2016  . History of hiatal hernia   . History of radiation therapy 03/10/17- 03/21/17   Whole brain, prophylactic, radiation 25 Gy in 10 fractions  . History of radiation therapy 11/18/16- 01/16/17   59.4 Gy to lung in 33 fractions.  . Hyperlipidemia   . IFG (impaired fasting glucose)   . Osteopenia   . Recurrent major depressive disorder ([place])   . Sleep apnea    mild no cpap needed  . Small cell carcinoma of right lung ([place]) 2018   surgery right middle lobe removed   . Tobacco dependence   . Varicose veins of both lower extremities   . Vitamin D deficiency     ALLERGIES:  has No Known Allergies.  MEDICATIONS:  Current Outpatient Medications  Medication Sig Dispense Refill  . albuterol (PROVENTIL HFA;VENTOLIN HFA) 108 (90 Base) MCG/ACT inhaler Inhale 2 puffs into the lungs every 6 (six) hours as needed for wheezing or shortness of breath. 1 Inhaler 3  .  calcium carbonate (TUMS - DOSED IN MG ELEMENTAL CALCIUM) 500 MG chewable tablet Chew 1 tablet daily as needed by mouth for indigestion or heartburn.    . dexamethasone (DECADRON) 2 MG tablet Take 1 tablet (2 mg total) by mouth daily. 30 tablet 3  . diphenhydramine-acetaminophen (TYLENOL PM) 25-500 MG TABS tablet Take 1 tablet at bedtime as needed by mouth (sleep).    Marland Kitchen guaiFENesin (MUCINEX) 600 MG 12 hr tablet Take 600 mg by mouth 2 (two) times daily as needed for  cough or to loosen phlegm.    . Ibuprofen-diphenhydrAMINE Cit (MOTRIN PM PO) Take by mouth.    . loratadine (CLARITIN) 10 MG tablet Take 10 mg by mouth daily as needed (hay fever).    . meloxicam (MOBIC) 7.5 MG tablet Take 7.5 mg by mouth daily.    Marland Kitchen omeprazole (PRILOSEC) 40 MG capsule Take 40 mg by mouth daily.   3  . ondansetron (ZOFRAN) 8 MG tablet TAKE 1 TABLET (8 MG TOTAL) BY MOUTH EVERY 8 (EIGHT) HOURS AS NEEDED FOR UP TO 7 DAYS FOR NAUSEA.  2  . polyethylene glycol (MIRALAX / GLYCOLAX) packet Take 17 g by mouth daily.    . sertraline (ZOLOFT) 100 MG tablet TAKE 1 TABLET BY MOUTH  DAILY 90 tablet 3  . tiotropium (SPIRIVA HANDIHALER) 18 MCG inhalation capsule Place 1 capsule (18 mcg total) into inhaler and inhale daily. 30 capsule 3   No current facility-administered medications for this visit.    SURGICAL HISTORY:  Past Surgical History:  Procedure Laterality Date  . ABDOMINAL HYSTERECTOMY     1 ovary left  . ANTERIOR APPROACH HEMI HIP ARTHROPLASTY Left 08/12/2017   Procedure: ANTERIOR APPROACH HEMI HIP ARTHROPLASTY;  Surgeon: Leandrew Koyanagi, MD;  Location: [facility];  Service: Orthopedics;  Laterality: Left;  . ENDOBRONCHIAL ULTRASOUND Bilateral 10/15/2016   Procedure: ENDOBRONCHIAL ULTRASOUND;  Surgeon: Collene Gobble, MD;  Location: [facility];  Service: Cardiopulmonary;  Laterality: Bilateral;  . INCONTINENCE SURGERY     bladder sling  . right middle lobe lung surgery  06/26/2016   [facility]    REVIEW OF SYSTEMS:  A comprehensive review of systems was negative except for: Constitutional: positive for fatigue Respiratory: positive for dyspnea on exertion   LABORATORY DATA: Lab Results  Component Value Date   WBC 11.6 (H) 12/07/2018   HGB 13.6 12/07/2018   HCT 42.0 12/07/2018   MCV 94.2 12/07/2018   PLT 242 12/07/2018      Chemistry      Component Value Date/Time   NA 139 12/07/2018 1213   NA 135 (L) 01/10/2017 1045   K 5.9 (H) 12/07/2018 1213   K 4.9 01/10/2017 1045     CL 101 12/07/2018 1213   CO2 30 12/07/2018 1213   CO2 20 (L) 01/10/2017 1045   BUN 36 (H) 12/07/2018 1213   BUN 27.6 (H) 01/10/2017 1045   CREATININE 1.27 (H) 12/07/2018 1213   CREATININE 1.4 (H) 01/10/2017 1045      Component Value Date/Time   CALCIUM 10.3 12/07/2018 1213   CALCIUM 9.7 01/10/2017 1045   ALKPHOS 56 12/07/2018 0941   ALKPHOS 77 01/10/2017 1045   AST 13 (L) 12/07/2018 0941   AST 10 01/10/2017 1045   ALT 14 12/07/2018 0941   ALT 11 01/10/2017 1045   BILITOT 0.4 12/07/2018 0941   BILITOT 0.34 01/10/2017 1045       RADIOGRAPHIC STUDIES: CT Chest Wo Contrast  Result Date: 12/23/2018 CLINICAL DATA:  73 year old female  with history of lung cancer. Restaging. EXAM: CT CHEST WITHOUT CONTRAST TECHNIQUE: Multidetector CT imaging of the chest was performed following the standard protocol without IV contrast. COMPARISON:  06/05/2018. FINDINGS: Cardiovascular: Normal heart size. Aortic atherosclerosis. Lad coronary artery calcification. The Mediastinum/Nodes: No enlarged mediastinal or axillary lymph nodes. Thyroid gland, trachea, and esophagus demonstrate no significant findings. Lungs/Pleura: No pleural effusion. Mild paraseptal emphysema. Postoperative change within the right lung is again identified status post right middle lobectomy. Bandlike area of masslike architectural distortion and fibrosis within the right lung is again identified and is unchanged from previous exam. No suspicious pulmonary nodule or mass identified to suggest residual/recurrence of disease. Upper Abdomen: Normal appearance of the adrenal glands. No acute findings noted within the upper abdomen. Musculoskeletal: No chest wall mass or suspicious bone lesions identified. IMPRESSION: 1. No acute abnormality. No evidence for recurrence of tumor or metastatic disease. 2. Aortic atherosclerosis. Coronary artery calcifications noted. Aortic Atherosclerosis (ICD10-I70.0) and Emphysema (ICD10-J43.9). Electronically  Signed   By: Kerby Moors M.D.   On: 12/23/2018 16:52    ASSESSMENT AND PLAN: This is a very pleasant 73 years old white female with history of limited stage small cell lung cancer status post left upper lobectomy in June 2018.  The patient had evidence for disease recurrence with hypermetabolic subcarinal lymphadenopathy that was biopsy-proven to be small cell carcinoma. She completed systemic chemotherapy with cisplatin and etoposide. She is status post 4 cycles. This is concurrent with radiotherapy.  She also underwent prophylactic cranial irradiation completed in early March 2019. The patient is currently on observation and she is feeling fine today with no concerning complaints. She had repeat CT scan of the chest performed recently.  I personally and independently reviewed the scans and discussed the results with the patient today. Her scan showed no concerning findings for disease recurrence or progression. I recommended for the patient to continue on observation for now with repeat CT scan of the chest in 6 months. She was advised to call immediately if she has any concerning symptoms in the interval. I discussed the assessment and treatment plan with the patient. The patient was provided an opportunity to ask questions and all were answered. The patient agreed with the plan and demonstrated an understanding of the instructions.   The patient was advised to call back or seek an in-person evaluation if the symptoms worsen or if the condition fails to improve as anticipated.  I provided 12 minutes of face-to-face video visit time during this encounter, and > 50% was spent counseling as documented under my assessment & plan.  Eilleen Kempf, MD 12/31/2018 9:47 AM  Disclaimer: This note was dictated with voice recognition software. Similar sounding words can inadvertently be transcribed and may not be corrected upon review.

## 2019-01-27 ENCOUNTER — Telehealth: Payer: Self-pay | Admitting: Radiation Therapy

## 2019-01-27 NOTE — Telephone Encounter (Signed)
Pt spoke with Dr. Pearlie Oyster nurse, Anderson Malta, and requested that her brain MRI be done in [facility] instead of [place] due to her transportation limitations. I called Ms. Gammon back in reference to this request. I let her know that [facility] does not have a 3T scanner that we use for treatment planning, and unfortunately if her scan is done in [facility] and has a new area that Dr. Isidore Moos would like to offer treatment to, she would need to have a second scan here in [place]. Her response was, "well let's hope there isn't anything new." Per her request we are cancelling the MRI at [place] and rescheduling it to be done at [facility].    Her insurance does not require Pre-Cert, so the location change is not an issue for approval.  I will let Anderson Malta know about this change.   Mont Dutton R.T.(R)(T)  Radiation Special Procedures Navigator

## 2019-02-08 ENCOUNTER — Other Ambulatory Visit: Payer: Medicare Other

## 2019-02-10 ENCOUNTER — Ambulatory Visit: Payer: Self-pay | Admitting: Radiation Oncology

## 2019-03-01 ENCOUNTER — Telehealth: Payer: Self-pay | Admitting: *Deleted

## 2019-03-01 NOTE — Telephone Encounter (Signed)
The pt called cancel her appt for 03/05/19. Because she didn't get to go for her mri appt.

## 2019-03-02 ENCOUNTER — Telehealth: Payer: Self-pay | Admitting: Radiation Therapy

## 2019-03-02 ENCOUNTER — Other Ambulatory Visit: Payer: Medicare PPO

## 2019-03-02 NOTE — Telephone Encounter (Signed)
I called Robyn Barton to see if she is interested in rescheduling the missed MRI appointment and follow-up with Dr. Isidore Moos. She said that she does, just not right now. She has not been feeling well and wants to wait until she is feeling better and has more energy before rescheduling anything. I offered to set the scan up in a few weeks, but she would rather call me when she is ready to try again. I confirmed that she has my contact information to give me a call back when she feels better. I will make Dr. Isidore Moos aware of Ms. Well's request.   Su Hoff R.T.(R)(T) Radiation Special Procedures Navigator

## 2019-03-05 ENCOUNTER — Ambulatory Visit: Payer: Medicare PPO | Admitting: Radiation Oncology

## 2019-05-27 ENCOUNTER — Telehealth: Payer: Self-pay

## 2019-05-27 NOTE — Telephone Encounter (Signed)
Received VM from patient earlier in the week stating she needed her upcoming brain MRI switched to [facility] due to transportation issues. Messaged Odie Sera Special Procedures Navigator and [employer] Support Specialist to see if this switch would be approved by patient's insurance. Received word yesterday 05/26/2019 that everything was changed and approved for patient to have scan at [facility].   Called patient back this morning, and left VM with above information. Encouraged patient to call clinic back should she have any other questions/concerns.

## 2019-05-29 ENCOUNTER — Other Ambulatory Visit: Payer: Self-pay

## 2019-05-29 ENCOUNTER — Ambulatory Visit (HOSPITAL_BASED_OUTPATIENT_CLINIC_OR_DEPARTMENT_OTHER)
Admission: RE | Admit: 2019-05-29 | Discharge: 2019-05-29 | Disposition: A | Discharge status: Home / Self Care | Payer: Medicare PPO | Source: Ambulatory Visit | Attending: Radiation Oncology | Admitting: Radiation Oncology

## 2019-05-29 DIAGNOSIS — C7949 Secondary malignant neoplasm of other parts of nervous system: Secondary | ICD-10-CM | POA: Diagnosis present

## 2019-05-29 DIAGNOSIS — C7931 Secondary malignant neoplasm of brain: Secondary | ICD-10-CM | POA: Insufficient documentation

## 2019-05-29 MED ORDER — GADOBUTROL 1 MMOL/ML IV SOLN
6.0000 mL | Freq: Once | INTRAVENOUS | Status: AC | PRN
  Administered 2019-05-29: 6 mL via INTRAVENOUS

## 2019-06-03 ENCOUNTER — Telehealth: Payer: Self-pay | Admitting: Medical Oncology

## 2019-06-03 ENCOUNTER — Other Ambulatory Visit: Payer: Self-pay | Admitting: Medical Oncology

## 2019-06-03 DIAGNOSIS — C3411 Malignant neoplasm of upper lobe, right bronchus or lung: Secondary | ICD-10-CM

## 2019-06-03 NOTE — Telephone Encounter (Signed)
Labs ordered for [place] June 14th ., Pt will call to confirm appt at facility. Schedule message sent for virtual visit with [facility].

## 2019-06-04 ENCOUNTER — Inpatient Hospital Stay: Payer: Medicare PPO

## 2019-06-07 ENCOUNTER — Inpatient Hospital Stay: Payer: Medicare PPO | Admitting: Internal Medicine

## 2019-06-08 ENCOUNTER — Telehealth: Payer: Self-pay | Admitting: *Deleted

## 2019-06-08 NOTE — Telephone Encounter (Signed)
CALLED PATIENT TO INFORM THAT DR. SQUIRE ON 06-09-19 @ 3:30 PM WILL CALL HER TO GO OVER MRI RESULTS, SPOKE WITH PATIENT AND SHE VERIFIED UNDERSTANDING THIS

## 2019-06-09 ENCOUNTER — Ambulatory Visit
Admission: RE | Admit: 2019-06-09 | Discharge: 2019-06-09 | Disposition: A | Discharge status: Home / Self Care | Payer: Medicare PPO | Source: Ambulatory Visit | Attending: Radiation Oncology | Admitting: Radiation Oncology

## 2019-06-09 DIAGNOSIS — R5382 Chronic fatigue, unspecified: Secondary | ICD-10-CM

## 2019-06-09 DIAGNOSIS — C3411 Malignant neoplasm of upper lobe, right bronchus or lung: Secondary | ICD-10-CM

## 2019-06-11 ENCOUNTER — Other Ambulatory Visit: Payer: Self-pay | Admitting: Radiation Oncology

## 2019-06-11 ENCOUNTER — Encounter: Payer: Self-pay | Admitting: Radiation Oncology

## 2019-06-11 DIAGNOSIS — R5382 Chronic fatigue, unspecified: Secondary | ICD-10-CM

## 2019-06-11 NOTE — Progress Notes (Signed)
Radiation Oncology         [phone]) [phone] ________________________________  Name: Robyn Barton MRN: [id number]  Date: 06/09/2019  DOB: [birth year]  Follow-Up Visit Note (by phone due to pandemic precautions, as patient could not access MyChart video)  Outpatient  CC: Robyn Barton., MD  [name], MD  Diagnosis and Prior Radiotherapy:    ICD-10-CM   1. Chronic fatigue  R53.82   2. 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  She continues to have fatigue.  Her involuntary jerking comes and goes but is better than it was approximately a year ago.  She reports that the jerking takes place more often when she feels anxious.  Next CT scan of her chest is scheduled for mid June.  Previous CT scan of her chest in December showed no evidence of progression or recurrence.  I have personally reviewed her MRI conducted on 05/29/2019 which shows no evidence of metastatic disease.  She has chronic white matter changes.  She denies any new neurologic issues.  ALLERGIES:  has No Known Allergies.  Meds: Current Outpatient Medications  Medication Sig Dispense Refill  . albuterol (PROVENTIL HFA;VENTOLIN HFA) 108 (90 Base) MCG/ACT inhaler Inhale 2 puffs into the lungs every 6 (six) hours as needed for wheezing or shortness of breath. 1 Inhaler 3  . calcium carbonate (TUMS - DOSED IN MG ELEMENTAL CALCIUM) 500 MG chewable tablet Chew 1 tablet daily as needed by mouth for indigestion or heartburn.    . dexamethasone (DECADRON) 2 MG  tablet Take 1 tablet (2 mg total) by mouth daily. 30 tablet 3  . diphenhydramine-acetaminophen (TYLENOL PM) 25-500 MG TABS tablet Take 1 tablet at bedtime as needed by mouth (sleep).    Marland Kitchen guaiFENesin (MUCINEX) 600 MG 12 hr tablet Take 600 mg by mouth 2 (two) times daily as needed for cough or to loosen phlegm.    . Ibuprofen-diphenhydrAMINE Cit (MOTRIN PM PO) Take by mouth.    . loratadine (CLARITIN) 10 MG tablet Take 10 mg by mouth daily as needed (hay fever).    . meloxicam (MOBIC) 7.5 MG tablet Take 7.5 mg by mouth daily.    Marland Kitchen omeprazole (PRILOSEC) 40 MG capsule Take 40 mg by mouth daily.   3  . ondansetron (ZOFRAN) 8 MG tablet TAKE 1 TABLET (8 MG TOTAL) BY MOUTH EVERY 8 (EIGHT) HOURS AS NEEDED FOR UP TO 7 DAYS FOR NAUSEA.  2  . polyethylene glycol (MIRALAX / GLYCOLAX) packet Take 17 g by mouth daily.    . sertraline (ZOLOFT) 100 MG tablet TAKE 1 TABLET BY MOUTH  DAILY 90 tablet 3  . tiotropium (SPIRIVA HANDIHALER) 18 MCG inhalation capsule Place 1 capsule (18 mcg total) into inhaler and inhale daily. 30 capsule 3   No current facility-administered medications for this encounter.    Physical Findings:  vitals were not taken for this visit.    General: Alert and oriented, in no acute distress.   Lab Findings: Lab Results  Component Value Date  WBC 11.6 (H) 12/07/2018   HGB 13.6 12/07/2018   HCT 42.0 12/07/2018   MCV 94.2 12/07/2018   PLT 242 12/07/2018    Radiographic Findings: MR Brain W Wo Contrast  Result Date: 05/30/2019 CLINICAL DATA:  Lung carcinoma staging EXAM: MRI HEAD WITHOUT AND WITH CONTRAST TECHNIQUE: Multiplanar, multiecho pulse sequences of the brain and surrounding structures were obtained without and with intravenous contrast. CONTRAST:  32mL GADAVIST GADOBUTROL 1 MMOL/ML IV SOLN COMPARISON:  None. FINDINGS: BRAIN: No acute infarct, acute hemorrhage or extra-axial collection. Diffuse confluent hyperintense T2-weighted signal within the periventricular, deep and  juxtacortical white matter, most commonly due to chronic ischemic microangiopathy. There is generalized atrophy without lobar predilection. No chronic microhemorrhage. Normal midline structures. There is no abnormal contrast enhancement. VASCULAR: Major flow voids are preserved. SKULL AND UPPER CERVICAL SPINE: Normal calvarium and skull base. Visualized upper cervical spine and soft tissues are normal. SINUSES/ORBITS: No paranasal sinus fluid levels or advanced mucosal thickening. No mastoid or middle ear effusion. Normal orbits. IMPRESSION: 1. No intracranial metastatic disease. 2. Chronic white matter changes reflecting history of radiation therapy. Electronically Signed   By: Ulyses Jarred M.D.   On: 05/30/2019 23:47    Impression/Plan:  Small cell lung cancer, status post PCI to the brain, also prior RT to lung by Dr Tammi Klippel. NED.  We will follow up with her next brain MRI in 12 months.  She will see Dr. Mickeal Skinner for follow-up at that time, as she is interested in following with a neurologist.  She declines seeing him sooner.  As for her chronic fatigue, I have added thyroid labs to be added to the other lab work ordered by medical oncology.  I asked scheduling to add this to her lab work when she comes in for her medical oncology appointment in June.  This encounter was provided by telemedicine platform phone as the patient could not access MyChart video The patient has given verbal consent for this type of encounter and has been advised to only accept a meeting of this type in a secure network environment. On date of service, in total, I spent 15 minutes on this encounter The attendants for this meeting include Eppie Gibson  and [name].  During the encounter, Eppie Gibson was located at [facility] Radiation Oncology Department.  [name] was located at home.   _____________________________________   Eppie Gibson, MD

## 2019-06-28 ENCOUNTER — Ambulatory Visit (HOSPITAL_BASED_OUTPATIENT_CLINIC_OR_DEPARTMENT_OTHER)
Admission: RE | Admit: 2019-06-28 | Discharge: 2019-06-28 | Disposition: A | Discharge status: Home / Self Care | Payer: Medicare PPO | Source: Ambulatory Visit | Attending: Internal Medicine | Admitting: Internal Medicine

## 2019-06-28 ENCOUNTER — Ambulatory Visit: Payer: Medicare PPO | Attending: Radiation Oncology

## 2019-06-28 ENCOUNTER — Encounter (HOSPITAL_BASED_OUTPATIENT_CLINIC_OR_DEPARTMENT_OTHER): Payer: Self-pay

## 2019-06-28 ENCOUNTER — Inpatient Hospital Stay: Payer: Medicare PPO | Attending: Hematology & Oncology

## 2019-06-28 ENCOUNTER — Ambulatory Visit: Payer: Medicare PPO

## 2019-06-28 ENCOUNTER — Other Ambulatory Visit: Payer: Self-pay

## 2019-06-28 DIAGNOSIS — C3411 Malignant neoplasm of upper lobe, right bronchus or lung: Secondary | ICD-10-CM

## 2019-06-28 LAB — CBC WITH DIFFERENTIAL (CANCER CENTER ONLY)
Abs Immature Granulocytes: 0.01 10*3/uL (ref 0.00–0.07)
Basophils Absolute: 0 10*3/uL (ref 0.0–0.1)
Basophils Relative: 0 %
Eosinophils Absolute: 0.2 10*3/uL (ref 0.0–0.5)
Eosinophils Relative: 3 %
HCT: 39.2 % (ref 36.0–46.0)
Hemoglobin: 12.6 g/dL (ref 12.0–15.0)
Immature Granulocytes: 0 %
Lymphocytes Relative: 24 %
Lymphs Abs: 1.7 10*3/uL (ref 0.7–4.0)
MCH: 30.1 pg (ref 26.0–34.0)
MCHC: 32.1 g/dL (ref 30.0–36.0)
MCV: 93.8 fL (ref 80.0–100.0)
Monocytes Absolute: 0.7 10*3/uL (ref 0.1–1.0)
Monocytes Relative: 11 %
Neutro Abs: 4.1 10*3/uL (ref 1.7–7.7)
Neutrophils Relative %: 62 %
Platelet Count: 192 10*3/uL (ref 150–400)
RBC: 4.18 MIL/uL (ref 3.87–5.11)
RDW: 14.6 % (ref 11.5–15.5)
WBC Count: 6.8 10*3/uL (ref 4.0–10.5)
nRBC: 0 % (ref 0.0–0.2)

## 2019-06-28 LAB — CMP (CANCER CENTER ONLY)
ALT: 9 U/L (ref 0–44)
AST: 15 U/L (ref 15–41)
Albumin: 4.1 g/dL (ref 3.5–5.0)
Alkaline Phosphatase: 51 U/L (ref 38–126)
Anion gap: 6 (ref 5–15)
BUN: 29 mg/dL — ABNORMAL HIGH (ref 8–23)
CO2: 30 mmol/L (ref 22–32)
Calcium: 10.2 mg/dL (ref 8.9–10.3)
Chloride: 102 mmol/L (ref 98–111)
Creatinine: 1.36 mg/dL — ABNORMAL HIGH (ref 0.44–1.00)
GFR, Est AFR Am: 44 mL/min — ABNORMAL LOW (ref >60)
GFR, Estimated: 38 mL/min — ABNORMAL LOW (ref >60)
Glucose, Bld: 101 mg/dL — ABNORMAL HIGH (ref 70–99)
Potassium: 5.5 mmol/L — ABNORMAL HIGH (ref 3.5–5.1)
Sodium: 138 mmol/L (ref 135–145)
Total Bilirubin: 0.3 mg/dL (ref 0.3–1.2)
Total Protein: 6.9 g/dL (ref 6.5–8.1)

## 2019-06-28 MED ORDER — IOHEXOL 300 MG/ML  SOLN
100.0000 mL | Freq: Once | INTRAMUSCULAR | Status: AC | PRN
  Administered 2019-06-28: 80 mL via INTRAVENOUS

## 2019-07-01 ENCOUNTER — Telehealth: Payer: Self-pay | Admitting: Internal Medicine

## 2019-07-01 NOTE — Telephone Encounter (Signed)
Scheduled appt per 6/17 sch message- unable to reach pt - left message with appt date and time

## 2019-07-05 ENCOUNTER — Telehealth: Payer: Self-pay | Admitting: *Deleted

## 2019-07-05 NOTE — Telephone Encounter (Signed)
Called patient to inform of lab for 07-06-19 @ 2 pm @ [place], spoke with patient and she is aware of this appt.

## 2019-07-06 ENCOUNTER — Other Ambulatory Visit: Payer: Self-pay

## 2019-07-06 ENCOUNTER — Ambulatory Visit: Payer: Medicare PPO

## 2019-07-06 DIAGNOSIS — R5382 Chronic fatigue, unspecified: Secondary | ICD-10-CM

## 2019-07-07 ENCOUNTER — Telehealth: Payer: Self-pay

## 2019-07-07 NOTE — Telephone Encounter (Signed)
Notified by Margretta Sidle that patient was not able to get lab work drawn at [facility] yesterday (staff was not able to see orders for TSH and free T4). Enid Derry called patient to see if she had an upcoming PCP appointment. Patient stated she was scheduled to see Dr. Blenda Mounts next week. Called office [phone]) and spoke with a receptionist named [place]. Explained situation and asked if Dr. Malen Gauze would be willing to draw TSH and T4 free with other lab work when he sees patient next week. [place] stated yes, but an order would need to be faxed over. Faxed order requisition forms for TSH and T4 free to [phone]. Received confirmation that fax went through successfully.

## 2019-07-08 ENCOUNTER — Encounter: Payer: Self-pay | Admitting: Internal Medicine

## 2019-07-08 ENCOUNTER — Inpatient Hospital Stay (HOSPITAL_BASED_OUTPATIENT_CLINIC_OR_DEPARTMENT_OTHER): Payer: Medicare PPO | Admitting: Internal Medicine

## 2019-07-08 DIAGNOSIS — C3490 Malignant neoplasm of unspecified part of unspecified bronchus or lung: Secondary | ICD-10-CM

## 2019-07-08 DIAGNOSIS — C3411 Malignant neoplasm of upper lobe, right bronchus or lung: Secondary | ICD-10-CM

## 2019-07-08 NOTE — Progress Notes (Signed)
[place] [phone]   [phone]  PROGRESS NOTE FOR TELEMEDICINE VISITS  [name], MD [address] [place] [address]  I connected [phone] on 07/08/19 at  8:45 AM EDT by telephone visit and verified that I am speaking with the correct person using two identifiers.   I discussed the limitations, risks, security and privacy concerns of performing an evaluation and management service by telemedicine and the availability of in-person appointments. I also discussed with the patient that there may be a patient responsible charge related to this service. The patient expressed understanding and agreed to proceed.  Other persons participating in the visit and their role in the encounter: None.  Patient's location: Home Provider's location: [place] small cell lung cancer initially diagnosed as Limited stage (T1a, N0, M0) small cell lung cancer diagnosed in June 2018.  PRIOR THERAPY:  1) right upper lobectomy on 06/26/2016 and the final pathology was consistent with small cell lung cancer measuring 1.6 cm in size. 2) systemic chemotherapy with cisplatin 60 MG/M2 on day 1 and etoposide 120 MG/M2 on days 1, 2 and 3 every 3 weeks. Status post 4 cycles. This was concurrent with radiation. 3) prophylactic cranial irradiation under the care of Dr. Isidore Moos.  CURRENT THERAPY: Observation.  INTERVAL HISTORY: Robyn Barton 74 y.o. female has a telephone virtual visit with me today for evaluation and discussion of her scan results.  The patient denied having any chest pain, shortness of breath, cough or hemoptysis.  She denied having any nausea, vomiting, diarrhea or constipation.  She continues to have fatigue.  She was seen by gastroenterologist at [facility] for intermittent nausea.  The patient denied having any recent weight loss or night sweats.  She has no headache or visual changes.  She had  repeat CT scan of the chest performed recently and she is here for evaluation and discussion of her scan results.   MEDICAL HISTORY: Past Medical History:  Diagnosis Date  . Allergic rhinitis   . Anxiety   . Elevated TSH    pt denies  . Emphysema lung ([place])   . Enlarged lymph nodes    in chest  . GERD (gastroesophageal reflux disease)   . Goals of care, counseling/discussion 10/21/2016  . History of hiatal hernia   . History of radiation therapy 03/10/17- 03/21/17   Whole brain, prophylactic, radiation 25 Gy in 10 fractions  . History of radiation therapy 11/18/16- 01/16/17   59.4 Gy to lung in 33 fractions.  . Hyperlipidemia   . IFG (impaired fasting glucose)   . Osteopenia   . Recurrent major depressive disorder ([place])   . Sleep apnea    mild no cpap needed  . Small cell carcinoma of right lung ([place]) 2018   surgery right middle lobe removed   . Tobacco dependence   . Varicose veins of both lower extremities   . Vitamin D deficiency     ALLERGIES:  has No Known Allergies.  MEDICATIONS:  Current Outpatient Medications  Medication Sig Dispense Refill  . albuterol (PROVENTIL HFA;VENTOLIN HFA) 108 (90 Base) MCG/ACT inhaler Inhale 2 puffs into the lungs every 6 (six) hours as needed for wheezing or shortness of breath. 1 Inhaler 3  . calcium carbonate (TUMS - DOSED IN MG ELEMENTAL CALCIUM) 500 MG chewable tablet Chew 1 tablet daily as needed by mouth for indigestion or heartburn.    . dexamethasone (DECADRON) 2 MG tablet Take 1  tablet (2 mg total) by mouth daily. 30 tablet 3  . diphenhydramine-acetaminophen (TYLENOL PM) 25-500 MG TABS tablet Take 1 tablet at bedtime as needed by mouth (sleep).    Marland Kitchen guaiFENesin (MUCINEX) 600 MG 12 hr tablet Take 600 mg by mouth 2 (two) times daily as needed for cough or to loosen phlegm.    . Ibuprofen-diphenhydrAMINE Cit (MOTRIN PM PO) Take by mouth.    . loratadine (CLARITIN) 10 MG tablet Take 10 mg by mouth daily as needed (hay fever).    .  meloxicam (MOBIC) 7.5 MG tablet Take 7.5 mg by mouth daily.    Marland Kitchen omeprazole (PRILOSEC) 40 MG capsule Take 40 mg by mouth daily.   3  . ondansetron (ZOFRAN) 8 MG tablet TAKE 1 TABLET (8 MG TOTAL) BY MOUTH EVERY 8 (EIGHT) HOURS AS NEEDED FOR UP TO 7 DAYS FOR NAUSEA.  2  . polyethylene glycol (MIRALAX / GLYCOLAX) packet Take 17 g by mouth daily.    . sertraline (ZOLOFT) 100 MG tablet TAKE 1 TABLET BY MOUTH  DAILY 90 tablet 3  . tiotropium (SPIRIVA HANDIHALER) 18 MCG inhalation capsule Place 1 capsule (18 mcg total) into inhaler and inhale daily. 30 capsule 3   No current facility-administered medications for this visit.    SURGICAL HISTORY:  Past Surgical History:  Procedure Laterality Date  . ABDOMINAL HYSTERECTOMY     1 ovary left  . ANTERIOR APPROACH HEMI HIP ARTHROPLASTY Left 08/12/2017   Procedure: ANTERIOR APPROACH HEMI HIP ARTHROPLASTY;  Surgeon: Leandrew Koyanagi, MD;  Location: [place] [facility];  Service: Orthopedics;  Laterality: Left;  . ENDOBRONCHIAL ULTRASOUND Bilateral 10/15/2016   Procedure: ENDOBRONCHIAL ULTRASOUND;  Surgeon: Collene Gobble, MD;  Location: [facility];  Service: Cardiopulmonary;  Laterality: Bilateral;  . INCONTINENCE SURGERY     bladder sling  . right middle lobe lung surgery  06/26/2016   [facility]    REVIEW OF SYSTEMS:  A comprehensive review of systems was negative except for: Constitutional: positive for fatigue Gastrointestinal: positive for nausea    LABORATORY DATA: Lab Results  Component Value Date   WBC 6.8 06/28/2019   HGB 12.6 06/28/2019   HCT 39.2 06/28/2019   MCV 93.8 06/28/2019   PLT 192 06/28/2019      Chemistry      Component Value Date/Time   NA 138 06/28/2019 1008   NA 135 (L) 01/10/2017 1045   K 5.5 (H) 06/28/2019 1008   K 4.9 01/10/2017 1045   CL 102 06/28/2019 1008   CO2 30 06/28/2019 1008   CO2 20 (L) 01/10/2017 1045   BUN 29 (H) 06/28/2019 1008   BUN 27.6 (H) 01/10/2017 1045   CREATININE 1.36 (H) 06/28/2019 1008   CREATININE 1.4  (H) 01/10/2017 1045      Component Value Date/Time   CALCIUM 10.2 06/28/2019 1008   CALCIUM 9.7 01/10/2017 1045   ALKPHOS 51 06/28/2019 1008   ALKPHOS 77 01/10/2017 1045   AST 15 06/28/2019 1008   AST 10 01/10/2017 1045   ALT 9 06/28/2019 1008   ALT 11 01/10/2017 1045   BILITOT 0.3 06/28/2019 1008   BILITOT 0.34 01/10/2017 1045       RADIOGRAPHIC STUDIES: CT Chest W Contrast  Result Date: 06/28/2019 CLINICAL DATA:  Small-cell lung cancer.  Restaging. EXAM: CT CHEST WITH CONTRAST TECHNIQUE: Multidetector CT imaging of the chest was performed during intravenous contrast administration. CONTRAST:  90mL OMNIPAQUE IOHEXOL 300 MG/ML  SOLN COMPARISON:  12/23/2018 FINDINGS: Cardiovascular: The heart size is normal.  No substantial pericardial effusion. Coronary artery calcification is evident. Atherosclerotic calcification is noted in the wall of the thoracic aorta. Mediastinum/Nodes: No mediastinal lymphadenopathy. There is no hilar lymphadenopathy. The esophagus has normal imaging features. There is no axillary lymphadenopathy. Lungs/Pleura: Centrilobular emphsyema noted. Volume loss again noted right hemithorax with parahilar scarring, similar to prior. Surgical changes noted in the right hilar region and anterior right lung compatible with previous resection. Biapical pleuroparenchymal scarring is similar. No new suspicious pulmonary nodule or mass. No focal airspace consolidation. No pleural effusion. Upper Abdomen: Unremarkable. Musculoskeletal: No worrisome lytic or sclerotic osseous abnormality. IMPRESSION: 1. Stable exam. No new or progressive interval findings to suggest recurrent or metastatic disease. 2.  Emphysema (ICD10-J43.9) and Aortic Atherosclerosis (ICD10-170.0) Electronically Signed   By: Misty Stanley M.D.   On: 06/28/2019 13:01    ASSESSMENT AND PLAN: This is a very pleasant 74years old white female with history of limited stage small cell lung cancer status post left upper  lobectomy in June 2018.  The patient had evidence for disease recurrence with hypermetabolic subcarinal lymphadenopathy that was biopsy-proven to be small cell carcinoma. She completed systemic chemotherapy with cisplatin and etoposide. She is status post 4 cycles. This is concurrent with radiotherapy.  She also underwent prophylactic cranial irradiation completed in early March 2019. The patient has been in observation since that time and she has no concerning issues except for intermittent nausea as well as fatigue. She had repeat CT scan of the chest performed recently.  I personally and independently reviewed the scans and discussed the results with the patient today. Her scan showed no concerning findings for disease recurrence or metastasis. I recommended for her to continue on observation with repeat CT scan of the chest in 6 months. For the hyperkalemia and renal insufficiency, the patient was advised to avoid any potassium rich diet and to increase her hydration. She was advised to call immediately if she has any concerning symptoms in the interval. I discussed the assessment and treatment plan with the patient. The patient was provided an opportunity to ask questions and all were answered. The patient agreed with the plan and demonstrated an understanding of the instructions.   The patient was advised to call back or seek an in-person evaluation if the symptoms worsen or if the condition fails to improve as anticipated.  I provided 15 minutes of non face-to-face telephone visit time during this encounter, and > 50% was spent counseling as documented under my assessment & plan.  Eilleen Kempf, MD 07/08/2019 9:59 AM  Disclaimer: This note was dictated with voice recognition software. Similar sounding words can inadvertently be transcribed and may not be corrected upon review.

## 2019-07-14 ENCOUNTER — Telehealth: Payer: Self-pay | Admitting: Internal Medicine

## 2019-07-14 NOTE — Telephone Encounter (Signed)
Scheduled per los. Called and left msg. Mailed printout  °

## 2019-10-20 IMAGING — CT CT ANGIO CHEST
2 of 8 series · 19 of 36 positions shown · IV contrast (isovue)
Comparison: PET CT 09/26/2016.  Chest CT 09/14/2016.

CLINICAL DATA: Right small cell lung cancer. Pulmonary embolus
suspected. Shortness of breath.

EXAM:
CT ANGIOGRAPHY CHEST WITH CONTRAST
TECHNIQUE: Multidetector CT imaging of the chest was performed using the
standard protocol during bolus administration of intravenous
contrast. Multiplanar CT image reconstructions and MIPs were
obtained to evaluate the vascular anatomy.
CONTRAST:  100 cc Isovue 370 IV

[Series 6: pe thins · axial · 0.63mm/px · z∈[-269,-38]mm · 18 of 259 slices shown]
[im 14/259  lung]
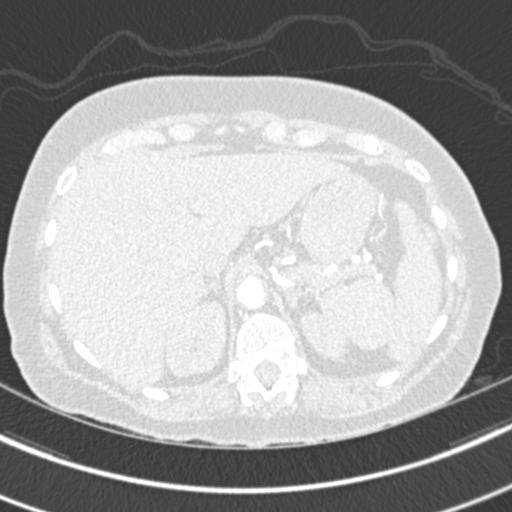
[im 28/259  mediastinal]
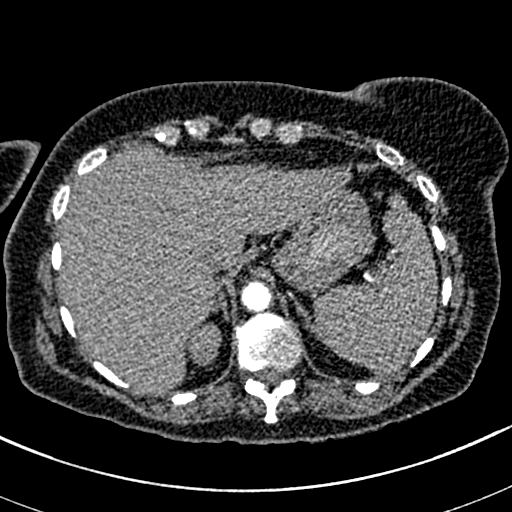
[im 41/259  lung]
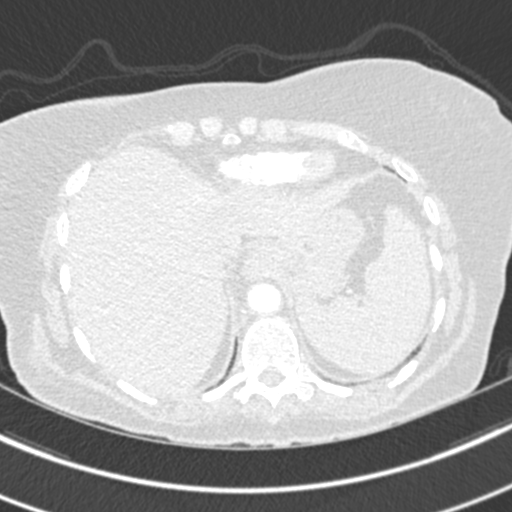
[im 55/259  mediastinal]
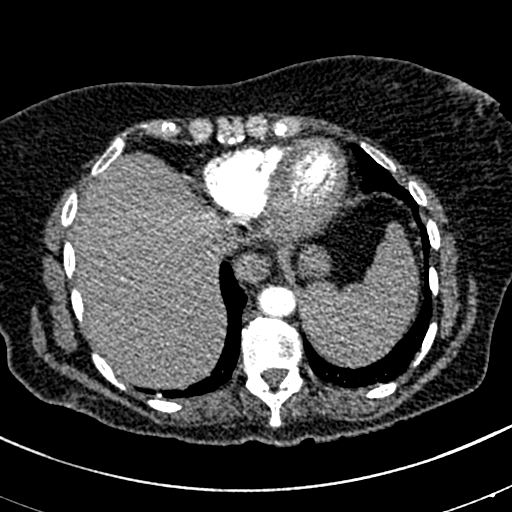
[im 68/259  lung]
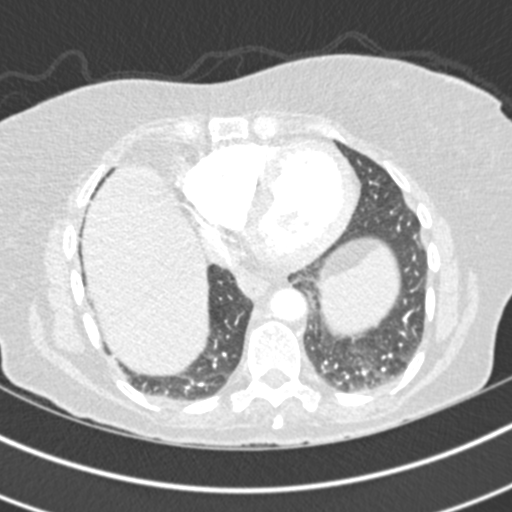
[im 82/259  mediastinal]
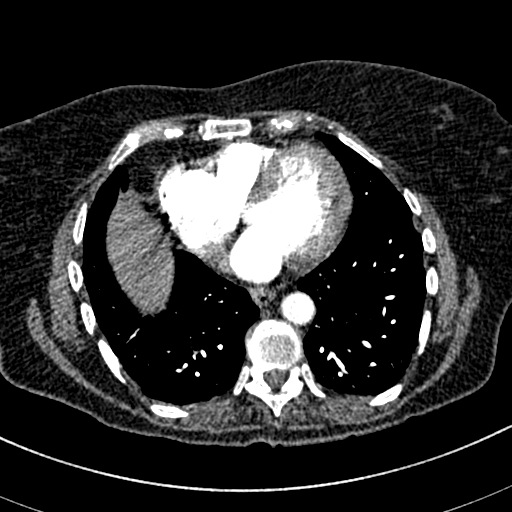
[im 96/259  lung]
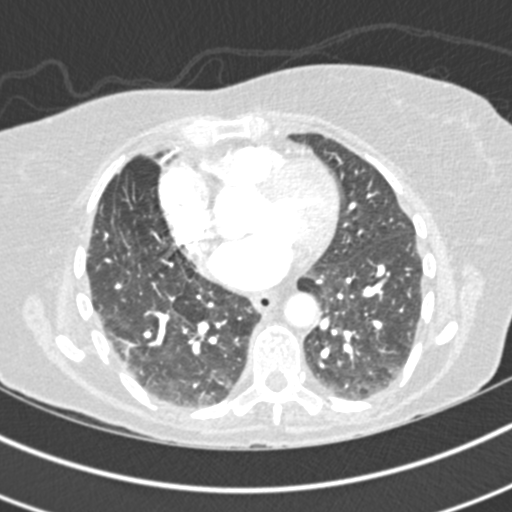
[im 109/259  mediastinal]
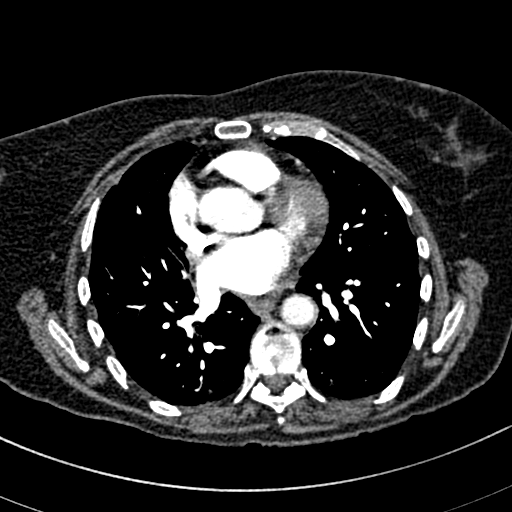
[im 123/259  lung]
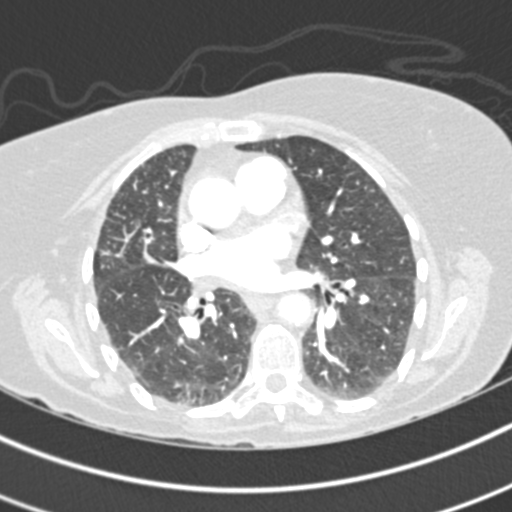
[im 136/259  mediastinal]
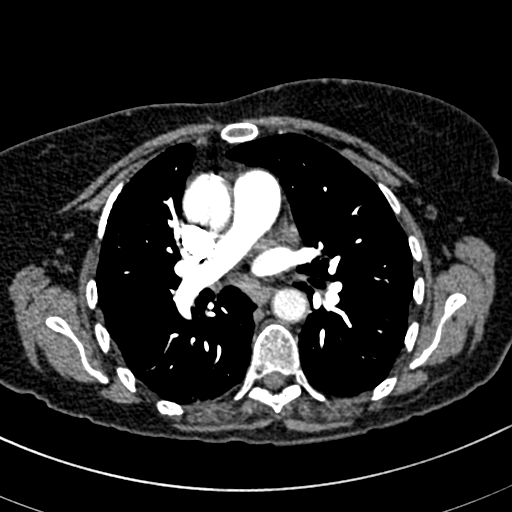
[im 150/259  lung]
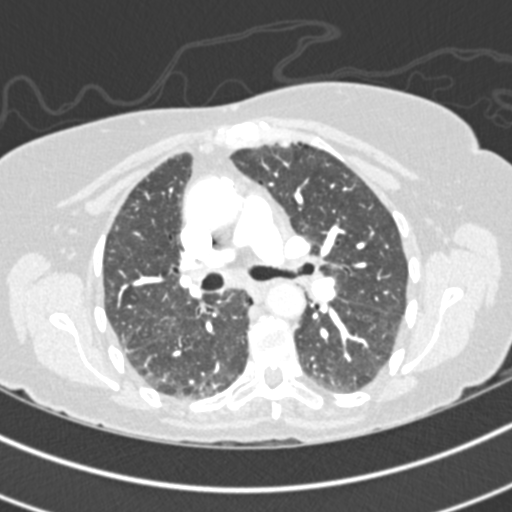
[im 163/259  mediastinal]
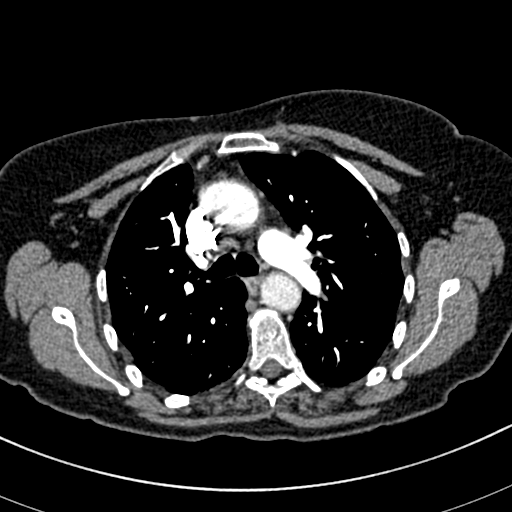
[im 177/259  lung]
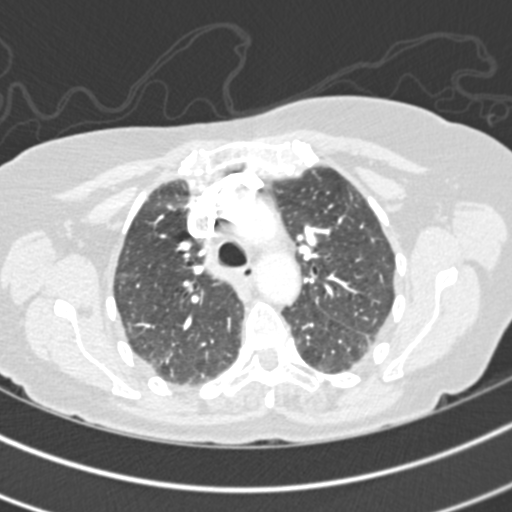
[im 191/259  mediastinal]
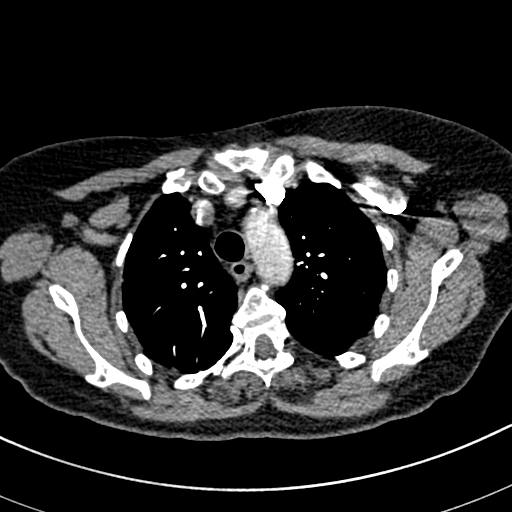
[im 204/259  lung]
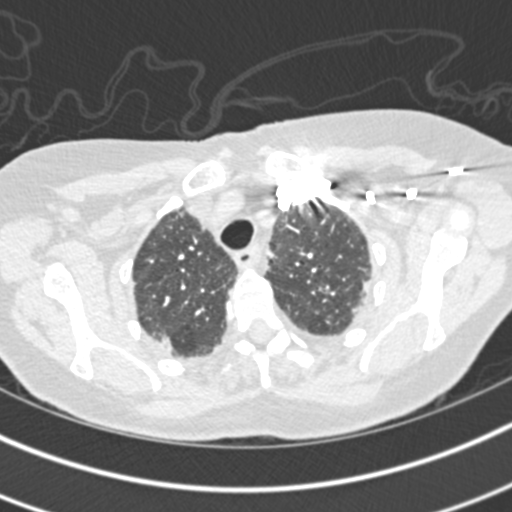
[im 218/259  mediastinal]
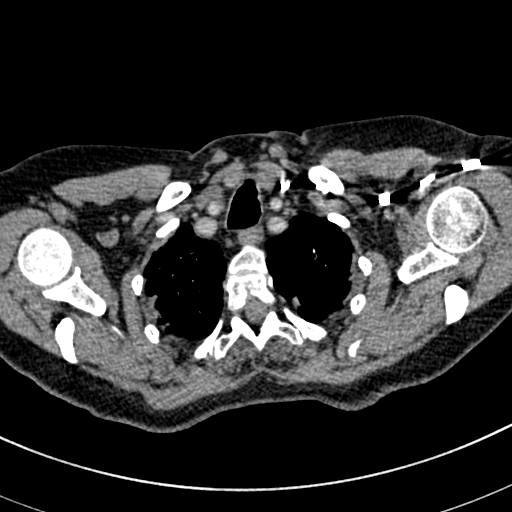
[im 231/259  lung]
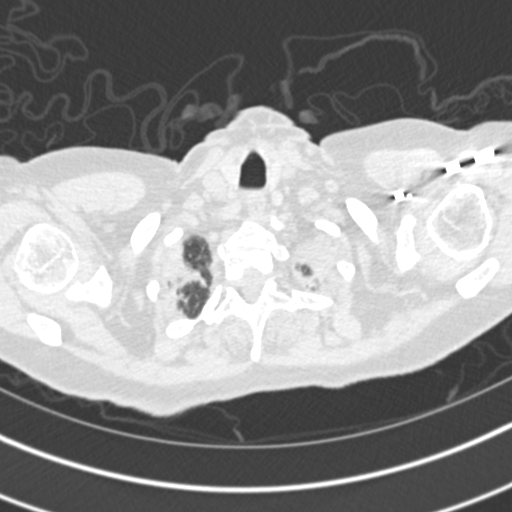
[im 245/259  mediastinal]
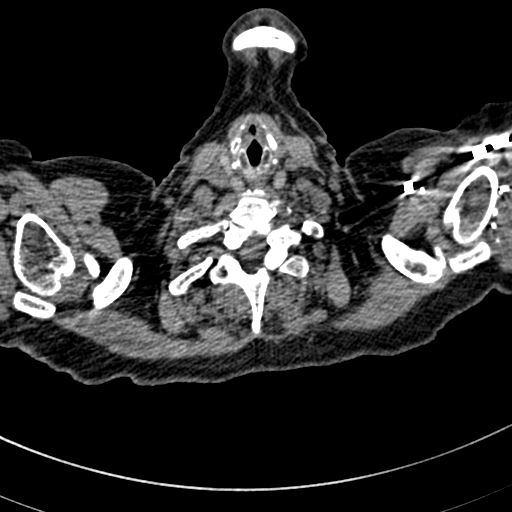

[Series 7: pe coronal mpr · coronal · 0.52mm/px · 1 of 108 slices shown]
[im 54/108  mediastinal]
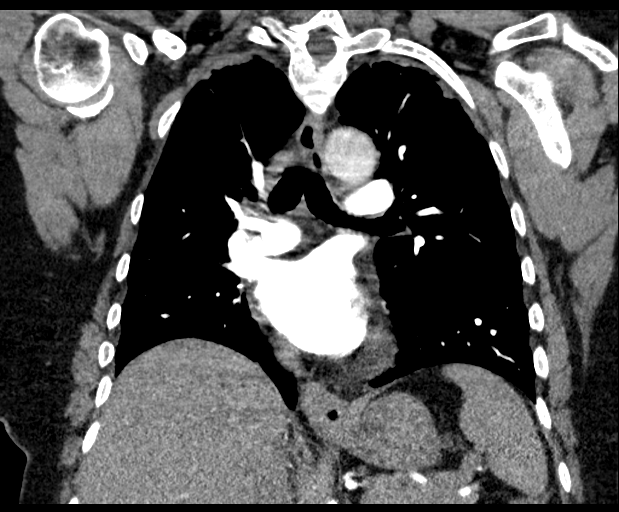

[19 of 36 positions shown; findings below may reference images not displayed]

FINDINGS: Cardiovascular: No filling defects in the pulmonary artery is to
suggest pulmonary emboli. Eft cirrhotic calcifications in the
ascending thoracic aorta and aortic arch. No aneurysm. Heart is
upper limits normal in size.

Mediastinum/Nodes: Subcarinal adenopathy has a short axis diameter
of 11 mm compared with 19 mm previously. Right paratracheal lymph
node has a short axis diameter of 8 mm compared 8 mm previously. No
visible hilar or axillary adenopathy.

Lungs/Pleura: Status post right middle lobectomy. Biapical scarring.
No confluent opacities or suspicious pulmonary nodules. No pleural
effusions.

Upper Abdomen: Imaging into the upper abdomen shows no acute
findings.

Musculoskeletal: Chest wall soft tissues are unremarkable. No acute
bony abnormality.

Review of the MIP images confirms the above findings.
IMPRESSION: No evidence of pulmonary embolus.

Postoperative changes on the right.

Decreasing prominence of subcarinal lymph nodes. Otherwise stable
small scattered mediastinal lymph nodes.

No acute cardiopulmonary process.

## 2020-01-03 ENCOUNTER — Other Ambulatory Visit: Payer: Self-pay | Admitting: Radiation Therapy

## 2020-01-03 DIAGNOSIS — C7931 Secondary malignant neoplasm of brain: Secondary | ICD-10-CM

## 2020-01-04 ENCOUNTER — Inpatient Hospital Stay: Payer: Medicare PPO | Attending: Internal Medicine

## 2020-01-06 ENCOUNTER — Ambulatory Visit: Payer: Medicare PPO | Admitting: Internal Medicine

## 2020-01-12 ENCOUNTER — Telehealth: Payer: Self-pay | Admitting: Internal Medicine

## 2020-01-12 ENCOUNTER — Telehealth: Payer: Self-pay

## 2020-01-12 NOTE — Telephone Encounter (Signed)
Lab appt made for 01/19/20 per secure chat (mohamed pt)    aom

## 2020-01-12 NOTE — Telephone Encounter (Signed)
Scheduled per 12/28 los. Called pt and left a msg

## 2020-01-19 ENCOUNTER — Telehealth: Payer: Self-pay

## 2020-01-19 ENCOUNTER — Other Ambulatory Visit: Payer: Medicare PPO

## 2020-01-19 ENCOUNTER — Ambulatory Visit (HOSPITAL_BASED_OUTPATIENT_CLINIC_OR_DEPARTMENT_OTHER): Payer: Medicare PPO

## 2020-01-19 NOTE — Telephone Encounter (Signed)
Returned pts call to r/s her lab appt prior to ct    aom

## 2020-01-24 ENCOUNTER — Telehealth: Payer: Medicare PPO | Admitting: Internal Medicine

## 2020-01-25 ENCOUNTER — Other Ambulatory Visit: Payer: Self-pay

## 2020-01-25 ENCOUNTER — Inpatient Hospital Stay: Payer: Medicare PPO | Attending: Internal Medicine

## 2020-01-25 ENCOUNTER — Ambulatory Visit (HOSPITAL_BASED_OUTPATIENT_CLINIC_OR_DEPARTMENT_OTHER): Payer: Medicare PPO

## 2020-01-25 ENCOUNTER — Ambulatory Visit (HOSPITAL_BASED_OUTPATIENT_CLINIC_OR_DEPARTMENT_OTHER)
Admission: RE | Admit: 2020-01-25 | Discharge: 2020-01-25 | Disposition: A | Discharge status: Home / Self Care | Payer: Medicare PPO | Source: Ambulatory Visit | Attending: Internal Medicine | Admitting: Internal Medicine

## 2020-01-25 DIAGNOSIS — C3490 Malignant neoplasm of unspecified part of unspecified bronchus or lung: Secondary | ICD-10-CM

## 2020-01-25 DIAGNOSIS — Z79899 Other long term (current) drug therapy: Secondary | ICD-10-CM | POA: Diagnosis not present

## 2020-01-25 DIAGNOSIS — R110 Nausea: Secondary | ICD-10-CM | POA: Diagnosis not present

## 2020-01-25 DIAGNOSIS — I2510 Atherosclerotic heart disease of native coronary artery without angina pectoris: Secondary | ICD-10-CM | POA: Diagnosis not present

## 2020-01-25 DIAGNOSIS — Z902 Acquired absence of lung [part of]: Secondary | ICD-10-CM | POA: Insufficient documentation

## 2020-01-25 DIAGNOSIS — M8580 Other specified disorders of bone density and structure, unspecified site: Secondary | ICD-10-CM | POA: Diagnosis not present

## 2020-01-25 DIAGNOSIS — I700 Atherosclerosis of aorta: Secondary | ICD-10-CM | POA: Diagnosis not present

## 2020-01-25 DIAGNOSIS — C3411 Malignant neoplasm of upper lobe, right bronchus or lung: Secondary | ICD-10-CM | POA: Diagnosis not present

## 2020-01-25 DIAGNOSIS — J479 Bronchiectasis, uncomplicated: Secondary | ICD-10-CM | POA: Diagnosis not present

## 2020-01-25 DIAGNOSIS — Z923 Personal history of irradiation: Secondary | ICD-10-CM | POA: Diagnosis not present

## 2020-01-25 DIAGNOSIS — Z7952 Long term (current) use of systemic steroids: Secondary | ICD-10-CM | POA: Diagnosis not present

## 2020-01-25 LAB — CMP (CANCER CENTER ONLY)
ALT: 9 U/L (ref 0–44)
AST: 14 U/L — ABNORMAL LOW (ref 15–41)
Albumin: 3.6 g/dL (ref 3.5–5.0)
Alkaline Phosphatase: 67 U/L (ref 38–126)
Anion gap: 10 (ref 5–15)
BUN: 22 mg/dL (ref 8–23)
CO2: 29 mmol/L (ref 22–32)
Calcium: 10.1 mg/dL (ref 8.9–10.3)
Chloride: 100 mmol/L (ref 98–111)
Creatinine: 1.27 mg/dL — ABNORMAL HIGH (ref 0.44–1.00)
GFR, Estimated: 44 mL/min — ABNORMAL LOW (ref >60)
Glucose, Bld: 109 mg/dL — ABNORMAL HIGH (ref 70–99)
Potassium: 5.1 mmol/L (ref 3.5–5.1)
Sodium: 139 mmol/L (ref 135–145)
Total Bilirubin: 0.2 mg/dL — ABNORMAL LOW (ref 0.3–1.2)
Total Protein: 7.9 g/dL (ref 6.5–8.1)

## 2020-01-25 LAB — CBC WITH DIFFERENTIAL (CANCER CENTER ONLY)
Abs Immature Granulocytes: 0.06 10*3/uL (ref 0.00–0.07)
Basophils Absolute: 0 10*3/uL (ref 0.0–0.1)
Basophils Relative: 0 %
Eosinophils Absolute: 0.2 10*3/uL (ref 0.0–0.5)
Eosinophils Relative: 2 %
HCT: 36.9 % (ref 36.0–46.0)
Hemoglobin: 12.1 g/dL (ref 12.0–15.0)
Immature Granulocytes: 1 %
Lymphocytes Relative: 17 %
Lymphs Abs: 1.3 10*3/uL (ref 0.7–4.0)
MCH: 30 pg (ref 26.0–34.0)
MCHC: 32.8 g/dL (ref 30.0–36.0)
MCV: 91.6 fL (ref 80.0–100.0)
Monocytes Absolute: 0.8 10*3/uL (ref 0.1–1.0)
Monocytes Relative: 10 %
Neutro Abs: 5.6 10*3/uL (ref 1.7–7.7)
Neutrophils Relative %: 70 %
Platelet Count: 284 10*3/uL (ref 150–400)
RBC: 4.03 MIL/uL (ref 3.87–5.11)
RDW: 12.7 % (ref 11.5–15.5)
WBC Count: 8 10*3/uL (ref 4.0–10.5)
nRBC: 0 % (ref 0.0–0.2)

## 2020-01-31 ENCOUNTER — Inpatient Hospital Stay (HOSPITAL_BASED_OUTPATIENT_CLINIC_OR_DEPARTMENT_OTHER): Payer: Medicare PPO | Admitting: Internal Medicine

## 2020-01-31 DIAGNOSIS — C3411 Malignant neoplasm of upper lobe, right bronchus or lung: Secondary | ICD-10-CM | POA: Diagnosis not present

## 2020-01-31 DIAGNOSIS — C3490 Malignant neoplasm of unspecified part of unspecified bronchus or lung: Secondary | ICD-10-CM

## 2020-01-31 NOTE — Progress Notes (Signed)
[place] [phone]   Fax:(336) [phone]  PROGRESS NOTE FOR TELEMEDICINE VISITS  [name], MD [place] [address]  I connected [phone] on 01/31/20 at 11:00 AM EST by video enabled telemedicine visit and verified that I am speaking with the correct person using two identifiers.   I discussed the limitations, risks, security and privacy concerns of performing an evaluation and management service by telemedicine and the availability of in-person appointments. I also discussed with the patient that there may be a patient responsible charge related to this service. The patient expressed understanding and agreed to proceed.  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  PRIOR THERAPY:  1) right upper lobectomy on 06/26/2016 and the final pathology was consistent with small cell lung cancer measuring 1.6 cm in size. 2) systemic chemotherapy with cisplatin 60 MG/M2 on day 1 and etoposide 120 MG/M2 on days 1, 2 and 3 every 3 weeks. Status post 4 cycles. This was concurrent with radiation. 3) prophylactic cranial irradiation under the care of Dr. Isidore Moos.  CURRENT THERAPY: Observation.  INTERVAL HISTORY: Robyn Barton 75 y.o. female has a MyChart video virtual visit with me today for evaluation and discussion of her scan results.  The patient is feeling fine today with no concerning complaints.  She denied having any current chest pain, shortness of breath, cough or hemoptysis.  She denied having any fever or chills.  She has intermittent nausea in the morning but no vomiting, diarrhea or constipation.  She denied having any headache or visual changes.  The patient had repeat CT scan of the  chest performed recently and she is here for evaluation and discussion of her scan results.  MEDICAL HISTORY: Past Medical History:  Diagnosis Date  . Allergic rhinitis   . Anxiety   . Elevated TSH    pt denies  . Emphysema lung ([place])   . Enlarged lymph nodes    in chest  . GERD (gastroesophageal reflux disease)   . Goals of care, counseling/discussion 10/21/2016  . History of hiatal hernia   . History of radiation therapy 03/10/17- 03/21/17   Whole brain, prophylactic, radiation 25 Gy in 10 fractions  . History of radiation therapy 11/18/16- 01/16/17   59.4 Gy to lung in 33 fractions.  . Hyperlipidemia   . IFG (impaired fasting glucose)   . Osteopenia   . Recurrent major depressive disorder ([place])   . Sleep apnea    mild no cpap needed  . Small cell carcinoma of right lung ([place]) 2018   surgery right middle lobe removed   . Tobacco dependence   . Varicose veins of both lower extremities   . Vitamin D deficiency     ALLERGIES:  has No Known Allergies.  MEDICATIONS:  Current Outpatient Medications  Medication Sig Dispense Refill  . albuterol (PROVENTIL HFA;VENTOLIN HFA) 108 (90 Base) MCG/ACT inhaler Inhale 2 puffs into the lungs every 6 (six) hours as needed for wheezing or shortness of breath. 1 Inhaler 3  . calcium carbonate (TUMS - DOSED IN MG ELEMENTAL CALCIUM) 500 MG chewable tablet Chew 1 tablet daily as needed by mouth for indigestion or heartburn.    . dexamethasone (DECADRON) 2 MG tablet Take 1 tablet (2 mg total)  by mouth daily. 30 tablet 3  . diphenhydramine-acetaminophen (TYLENOL PM) 25-500 MG TABS tablet Take 1 tablet at bedtime as needed by mouth (sleep).    Marland Kitchen guaiFENesin (MUCINEX) 600 MG 12 hr tablet Take 600 mg by mouth 2 (two) times daily as needed for cough or to loosen phlegm.    . Ibuprofen-diphenhydrAMINE Cit (MOTRIN PM PO) Take by mouth.    . loratadine (CLARITIN) 10 MG tablet Take 10 mg by mouth daily as needed (hay fever).    . meloxicam (MOBIC) 7.5 MG  tablet Take 7.5 mg by mouth daily.    Marland Kitchen omeprazole (PRILOSEC) 40 MG capsule Take 40 mg by mouth daily.   3  . ondansetron (ZOFRAN) 8 MG tablet TAKE 1 TABLET (8 MG TOTAL) BY MOUTH EVERY 8 (EIGHT) HOURS AS NEEDED FOR UP TO 7 DAYS FOR NAUSEA.  2  . polyethylene glycol (MIRALAX / GLYCOLAX) packet Take 17 g by mouth daily.    . sertraline (ZOLOFT) 100 MG tablet TAKE 1 TABLET BY MOUTH  DAILY 90 tablet 3  . tiotropium (SPIRIVA HANDIHALER) 18 MCG inhalation capsule Place 1 capsule (18 mcg total) into inhaler and inhale daily. 30 capsule 3   No current facility-administered medications for this visit.    SURGICAL HISTORY:  Past Surgical History:  Procedure Laterality Date  . ABDOMINAL HYSTERECTOMY     1 ovary left  . ANTERIOR APPROACH HEMI HIP ARTHROPLASTY Left 08/12/2017   Procedure: ANTERIOR APPROACH HEMI HIP ARTHROPLASTY;  Surgeon: Leandrew Koyanagi, MD;  Location: [facility];  Service: Orthopedics;  Laterality: Left;  . ENDOBRONCHIAL ULTRASOUND Bilateral 10/15/2016   Procedure: ENDOBRONCHIAL ULTRASOUND;  Surgeon: Collene Gobble, MD;  Location: [facility];  Service: Cardiopulmonary;  Laterality: Bilateral;  . INCONTINENCE SURGERY     bladder sling  . right middle lobe lung surgery  06/26/2016   [facility]    REVIEW OF SYSTEMS:  A comprehensive review of systems was negative except for: Gastrointestinal: positive for nausea    LABORATORY DATA: Lab Results  Component Value Date   WBC 8.0 01/25/2020   HGB 12.1 01/25/2020   HCT 36.9 01/25/2020   MCV 91.6 01/25/2020   PLT 284 01/25/2020      Chemistry      Component Value Date/Time   NA 139 01/25/2020 1424   NA 135 (L) 01/10/2017 1045   K 5.1 01/25/2020 1424   K 4.9 01/10/2017 1045   CL 100 01/25/2020 1424   CO2 29 01/25/2020 1424   CO2 20 (L) 01/10/2017 1045   BUN 22 01/25/2020 1424   BUN 27.6 (H) 01/10/2017 1045   CREATININE 1.27 (H) 01/25/2020 1424   CREATININE 1.4 (H) 01/10/2017 1045      Component Value Date/Time   CALCIUM 10.1  01/25/2020 1424   CALCIUM 9.7 01/10/2017 1045   ALKPHOS 67 01/25/2020 1424   ALKPHOS 77 01/10/2017 1045   AST 14 (L) 01/25/2020 1424   AST 10 01/10/2017 1045   ALT 9 01/25/2020 1424   ALT 11 01/10/2017 1045   BILITOT 0.2 (L) 01/25/2020 1424   BILITOT 0.34 01/10/2017 1045       RADIOGRAPHIC STUDIES: CT Chest Wo Contrast  Result Date: 01/25/2020 CLINICAL DATA:  Follow-up of small-cell lung cancer, restaging. EXAM: CT CHEST WITHOUT CONTRAST TECHNIQUE: Multidetector CT imaging of the chest was performed following the standard protocol without IV contrast. COMPARISON:  06/28/2019 FINDINGS: Cardiovascular: Aortic atherosclerosis. Tortuous thoracic aorta. Normal heart size, without pericardial effusion. Lad coronary artery calcification. Mediastinum/Nodes: No supraclavicular  adenopathy. No mediastinal or definite hilar adenopathy, given limitations of unenhanced CT. Lungs/Pleura: No pleural fluid. Mild centrilobular emphysema. Volume loss in the right hemithorax with tracheal deviation right. Right middle lobectomy or wedge resection. Biapical pleuroparenchymal scarring. Right paramediastinal and perihilar consolidation with traction bronchiectasis and architectural distortion, similar in morphology and distribution. New clustered tree-in-bud nodularity throughout the right lower lobe (83/4) and less so posterior right upper lobe on 45/4. Larger areas of apparent nodularity within the more anterior right lung base, including on 85/4, favored to represent mucoid impaction. Upper Abdomen: There may be focal steatosis in the anterior aspect of segment 4 of the liver on 143/2. Normal imaged portions of the spleen, stomach, pancreas, gallbladder, adrenal glands. Mild renal cortical thinning bilaterally. Musculoskeletal: Mild convex left thoracic spine curvature. IMPRESSION: 1. Similar postsurgical and radiation changes within the right lung. No typical findings of locally recurrent or metastatic disease. 2.  Right lower and less so right upper lobe clustered nodularity with probable mucoid impaction. Favor interval infectious bronchiolitis versus aspiration. 3. Aortic atherosclerosis (ICD10-I70.0), coronary artery atherosclerosis and emphysema (ICD10-J43.9). Electronically Signed   By: Abigail Miyamoto M.D.   On: 01/25/2020 15:41    ASSESSMENT AND PLAN: This is a very pleasant 75years old white female with history of limited stage small cell lung cancer status post left upper lobectomy in June 2018.  The patient had evidence for disease recurrence with hypermetabolic subcarinal lymphadenopathy that was biopsy-proven to be small cell carcinoma. She completed systemic chemotherapy with cisplatin and etoposide. She is status post 4 cycles. This is concurrent with radiotherapy.  She also underwent prophylactic cranial irradiation completed in early March 2019. The patient is currently on observation and she is feeling fine today with no concerning complaints. She had repeat CT scan of the chest performed recently.  I personally and independently reviewed the scans and discussed the results with the patient today. Her scan showed no concerning findings for disease recurrence or metastasis. I recommended for her to continue on observation with repeat CT scan of the chest in 6 months. The patient was advised to call immediately if she has any concerning symptoms in the interval. I discussed the assessment and treatment plan with the patient. The patient was provided an opportunity to ask questions and all were answered. The patient agreed with the plan and demonstrated an understanding of the instructions.   The patient was advised to call back or seek an in-person evaluation if the symptoms worsen or if the condition fails to improve as anticipated. The total time spent in the appointment was 25 minutes.   Eilleen Kempf, MD 01/31/2020 10:51 AM  Disclaimer: This note was dictated with voice recognition  software. Similar sounding words can inadvertently be transcribed and may not be corrected upon review.

## 2020-04-06 ENCOUNTER — Other Ambulatory Visit: Payer: Self-pay | Admitting: Radiation Therapy

## 2020-05-17 ENCOUNTER — Telehealth: Payer: Self-pay | Admitting: Radiation Therapy

## 2020-05-17 NOTE — Telephone Encounter (Signed)
Spoke with Robyn Barton about her upcoming follow-up with Dr. Mickeal Skinner. She is not interested in any future imaging or doctor appointments, including telehealth visits. Per her request, I have cancelled the appointment and will let both Dr. Isidore Moos and Dr. Mickeal Skinner know.   Mont Dutton R.T.(R)(T) Radiation Special Procedures Navigator

## 2020-05-20 ENCOUNTER — Ambulatory Visit (HOSPITAL_BASED_OUTPATIENT_CLINIC_OR_DEPARTMENT_OTHER): Payer: Medicare PPO

## 2020-05-25 ENCOUNTER — Ambulatory Visit: Payer: Medicare PPO | Admitting: Internal Medicine

## 2021-09-21 ENCOUNTER — Emergency Department (HOSPITAL_BASED_OUTPATIENT_CLINIC_OR_DEPARTMENT_OTHER)
Admission: EM | Admit: 2021-09-21 | Discharge: 2021-09-22 | Disposition: A | Discharge status: Home / Self Care | Payer: Medicare PPO | Attending: Emergency Medicine | Admitting: Emergency Medicine

## 2021-09-21 ENCOUNTER — Emergency Department (HOSPITAL_BASED_OUTPATIENT_CLINIC_OR_DEPARTMENT_OTHER): Payer: Medicare PPO

## 2021-09-21 ENCOUNTER — Encounter (HOSPITAL_BASED_OUTPATIENT_CLINIC_OR_DEPARTMENT_OTHER): Payer: Self-pay | Admitting: Urology

## 2021-09-21 DIAGNOSIS — W19XXXA Unspecified fall, initial encounter: Secondary | ICD-10-CM | POA: Diagnosis not present

## 2021-09-21 DIAGNOSIS — Z7951 Long term (current) use of inhaled steroids: Secondary | ICD-10-CM | POA: Diagnosis not present

## 2021-09-21 DIAGNOSIS — J449 Chronic obstructive pulmonary disease, unspecified: Secondary | ICD-10-CM | POA: Insufficient documentation

## 2021-09-21 DIAGNOSIS — R112 Nausea with vomiting, unspecified: Secondary | ICD-10-CM | POA: Diagnosis not present

## 2021-09-21 DIAGNOSIS — S6991XA Unspecified injury of right wrist, hand and finger(s), initial encounter: Secondary | ICD-10-CM | POA: Diagnosis present

## 2021-09-21 DIAGNOSIS — M545 Low back pain, unspecified: Secondary | ICD-10-CM | POA: Diagnosis not present

## 2021-09-21 DIAGNOSIS — N183 Chronic kidney disease, stage 3 unspecified: Secondary | ICD-10-CM | POA: Diagnosis not present

## 2021-09-21 DIAGNOSIS — S61511A Laceration without foreign body of right wrist, initial encounter: Secondary | ICD-10-CM | POA: Insufficient documentation

## 2021-09-21 DIAGNOSIS — Z87891 Personal history of nicotine dependence: Secondary | ICD-10-CM | POA: Insufficient documentation

## 2021-09-21 DIAGNOSIS — N390 Urinary tract infection, site not specified: Secondary | ICD-10-CM

## 2021-09-21 DIAGNOSIS — Z20822 Contact with and (suspected) exposure to covid-19: Secondary | ICD-10-CM | POA: Diagnosis not present

## 2021-09-21 DIAGNOSIS — Z85118 Personal history of other malignant neoplasm of bronchus and lung: Secondary | ICD-10-CM | POA: Diagnosis not present

## 2021-09-21 DIAGNOSIS — E876 Hypokalemia: Secondary | ICD-10-CM | POA: Insufficient documentation

## 2021-09-21 DIAGNOSIS — R531 Weakness: Secondary | ICD-10-CM | POA: Diagnosis not present

## 2021-09-21 LAB — CBC WITH DIFFERENTIAL/PLATELET
Abs Immature Granulocytes: 0.03 10*3/uL (ref 0.00–0.07)
Basophils Absolute: 0 10*3/uL (ref 0.0–0.1)
Basophils Relative: 0 %
Eosinophils Absolute: 0.1 10*3/uL (ref 0.0–0.5)
Eosinophils Relative: 1 %
HCT: 41.3 % (ref 36.0–46.0)
Hemoglobin: 14 g/dL (ref 12.0–15.0)
Immature Granulocytes: 0 %
Lymphocytes Relative: 16 %
Lymphs Abs: 1.3 10*3/uL (ref 0.7–4.0)
MCH: 30.4 pg (ref 26.0–34.0)
MCHC: 33.9 g/dL (ref 30.0–36.0)
MCV: 89.8 fL (ref 80.0–100.0)
Monocytes Absolute: 0.8 10*3/uL (ref 0.1–1.0)
Monocytes Relative: 10 %
Neutro Abs: 5.5 10*3/uL (ref 1.7–7.7)
Neutrophils Relative %: 73 %
Platelets: 244 10*3/uL (ref 150–400)
RBC: 4.6 MIL/uL (ref 3.87–5.11)
RDW: 13.3 % (ref 11.5–15.5)
WBC: 7.6 10*3/uL (ref 4.0–10.5)
nRBC: 0 % (ref 0.0–0.2)

## 2021-09-21 LAB — RESP PANEL BY RT-PCR (FLU A&B, COVID) ARPGX2
Influenza A by PCR: NEGATIVE
Influenza B by PCR: NEGATIVE
SARS Coronavirus 2 by RT PCR: NEGATIVE

## 2021-09-21 LAB — COMPREHENSIVE METABOLIC PANEL
ALT: 15 U/L (ref 0–44)
AST: 26 U/L (ref 15–41)
Albumin: 4 g/dL (ref 3.5–5.0)
Alkaline Phosphatase: 66 U/L (ref 38–126)
Anion gap: 12 (ref 5–15)
BUN: 31 mg/dL — ABNORMAL HIGH (ref 8–23)
CO2: 31 mmol/L (ref 22–32)
Calcium: 9.4 mg/dL (ref 8.9–10.3)
Chloride: 93 mmol/L — ABNORMAL LOW (ref 98–111)
Creatinine, Ser: 1.4 mg/dL — ABNORMAL HIGH (ref 0.44–1.00)
GFR, Estimated: 39 mL/min — ABNORMAL LOW (ref >60)
Glucose, Bld: 109 mg/dL — ABNORMAL HIGH (ref 70–99)
Potassium: 2.8 mmol/L — ABNORMAL LOW (ref 3.5–5.1)
Sodium: 136 mmol/L (ref 135–145)
Total Bilirubin: 0.7 mg/dL (ref 0.3–1.2)
Total Protein: 8.2 g/dL — ABNORMAL HIGH (ref 6.5–8.1)

## 2021-09-21 LAB — LIPASE, BLOOD: Lipase: 36 U/L (ref 11–51)

## 2021-09-21 LAB — TROPONIN I (HIGH SENSITIVITY): Troponin I (High Sensitivity): 8 ng/L (ref <18)

## 2021-09-21 MED ORDER — LACTATED RINGERS IV BOLUS
1000.0000 mL | Freq: Once | INTRAVENOUS | Status: AC
  Administered 2021-09-21: 1000 mL via INTRAVENOUS

## 2021-09-21 MED ORDER — IOHEXOL 300 MG/ML  SOLN
80.0000 mL | Freq: Once | INTRAMUSCULAR | Status: AC | PRN
  Administered 2021-09-21: 80 mL via INTRAVENOUS

## 2021-09-21 MED ORDER — POTASSIUM CHLORIDE 20 MEQ PO PACK
60.0000 meq | PACK | Freq: Once | ORAL | Status: AC
Start: 2021-09-21 — End: 2021-09-21
  Administered 2021-09-21: 60 meq via ORAL
  Filled 2021-09-21: qty 3

## 2021-09-21 MED ORDER — ONDANSETRON HCL 4 MG/2ML IJ SOLN
4.0000 mg | Freq: Once | INTRAMUSCULAR | Status: AC
  Administered 2021-09-21: 4 mg via INTRAVENOUS
  Filled 2021-09-21: qty 2

## 2021-09-21 NOTE — ED Triage Notes (Signed)
Per EMS, pt here from home with c/o N/V x 3 days, states generalized weakness that started today with mechanical fall as result.  Pt Denies hitting head with fall, no LOC.  Pt states mild low back pain at time of fall but denies pain at this time NAD noted at this time, A&O x4

## 2021-09-21 NOTE — ED Notes (Signed)
Unable to provide urine sample at this time

## 2021-09-21 NOTE — ED Provider Notes (Signed)
[facility] Provider Note  CSN: [id number] Arrival date & time: 09/21/21 2042  Chief Complaint(s) Weakness  HPI Robyn Barton is a 76 y.o. female with history of COPD, lung cancer status post resection presenting to the emergency department with generalized weakness.  Patient reports 3 days of generalized weakness with associated nausea and vomiting for 3 days.  She otherwise denies any symptoms.  She denies chest pain, shortness of breath, abdominal pain, diarrhea.  She reports some lightheadedness now as she has not been able to tolerate as much p.o.  She fell today, had a mechanical fall, did not hit head, landed on bottom, has some mild low back pain and skin tear on the right wrist but no wrist pain.  Denies neck pain or headache.   Past Medical History Past Medical History:  Diagnosis Date   Allergic rhinitis    Anxiety    Elevated TSH    pt denies   Emphysema lung (HCC)    Enlarged lymph nodes    in chest   GERD (gastroesophageal reflux disease)    Goals of care, counseling/discussion 10/21/2016   History of hiatal hernia    History of radiation therapy 03/10/17- 03/21/17   Whole brain, prophylactic, radiation 25 Gy in 10 fractions   History of radiation therapy 11/18/16- 01/16/17   59.4 Gy to lung in 33 fractions.   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   Hip fracture ([place]) 08/11/2017   CKD (chronic kidney disease), stage III ([place]) 08/11/2017   Paresthesia of both feet 04/17/2017   Osteopenia 04/17/2017   Depression, recurrent ([place]) 02/20/2017   Anemia due to antineoplastic chemotherapy  12/18/2016   Anxiety 11/26/2016   Leukocytosis 11/24/2016   Dyspnea 11/15/2016   Goals of care, counseling/discussion 10/21/2016   Encounter for antineoplastic chemotherapy 10/21/2016   Mediastinal lymphadenopathy    Small cell lung cancer, right upper lobe ([place]) 08/20/2016   COPD (chronic obstructive pulmonary disease) ([place]) 08/20/2016   ARDS (adult respiratory distress syndrome) ([place]) 08/20/2016   Home Medication(s) Prior to Admission medications   Medication Sig Start Date End Date Taking? Authorizing Provider  albuterol (PROVENTIL HFA;VENTOLIN HFA) 108 (90 Base) MCG/ACT inhaler Inhale 2 puffs into the lungs every 6 (six) hours as needed for wheezing or shortness of breath. 04/23/17   Shelda Pal, DO  calcium carbonate (TUMS - DOSED IN MG ELEMENTAL CALCIUM) 500 MG chewable tablet Chew 1 tablet daily as needed by mouth for indigestion or heartburn.    [provider]  dexamethasone (DECADRON) 2 MG tablet Take 1 tablet (2 mg total) by mouth daily. 12/09/17   Vaslow, Acey Lav, MD  diphenhydramine-acetaminophen (TYLENOL PM) 25-500 MG TABS tablet Take 1 tablet at bedtime as needed by mouth (sleep).    [provider]  guaiFENesin (MUCINEX) 600 MG 12 hr tablet Take 600 mg by mouth 2 (two) times daily as needed for cough or to loosen phlegm.    [provider]  Ibuprofen-diphenhydrAMINE Cit (MOTRIN PM PO) Take by mouth.  [provider]  loratadine (CLARITIN) 10 MG tablet Take 10 mg by mouth daily as needed (hay fever).    [provider]  meloxicam (MOBIC) 7.5 MG tablet Take 7.5 mg by mouth daily. 01/08/18   [provider]  omeprazole (PRILOSEC) 40 MG capsule Take 40 mg by mouth daily.  08/01/16   [provider]  ondansetron (ZOFRAN) 8 MG tablet TAKE 1 TABLET (8 MG TOTAL) BY MOUTH EVERY 8 (EIGHT) HOURS AS NEEDED FOR UP TO 7 DAYS FOR NAUSEA. 07/24/17   [provider]  polyethylene glycol (MIRALAX / GLYCOLAX)  packet Take 17 g by mouth daily.    [provider]  sertraline (ZOLOFT) 100 MG tablet TAKE 1 TABLET BY MOUTH  DAILY 03/23/18   Shelda Pal, DO  tiotropium (SPIRIVA HANDIHALER) 18 MCG inhalation capsule Place 1 capsule (18 mcg total) into inhaler and inhale daily. 04/23/17   Shelda Pal, DO                                                                                                                                    Past Surgical History Past Surgical History:  Procedure Laterality Date   ABDOMINAL HYSTERECTOMY     1 ovary left   ANTERIOR APPROACH HEMI HIP ARTHROPLASTY Left 08/12/2017   Procedure: ANTERIOR APPROACH HEMI HIP ARTHROPLASTY;  Surgeon: Leandrew Koyanagi, MD;  Location: [facility];  Service: Orthopedics;  Laterality: Left;   ENDOBRONCHIAL ULTRASOUND Bilateral 10/15/2016   Procedure: ENDOBRONCHIAL ULTRASOUND;  Surgeon: Collene Gobble, MD;  Location: [facility];  Service: Cardiopulmonary;  Laterality: Bilateral;   INCONTINENCE SURGERY     bladder sling   right middle lobe lung surgery  06/26/2016   [facility]   Family History Family History  Problem Relation Age of Onset   Depression Mother    Cancer Father    COPD Father    Heart disease Father    High blood pressure Father    High Cholesterol Father     Social History Social History   Tobacco Use   Smoking status: Former    Packs/day: 1.50    Years: 50.00    Total pack years: 75.00    Types: Cigarettes    Quit date: 06/14/2016    Years since quitting: 5.2   Smokeless tobacco: Never  Vaping Use   Vaping Use: Never used  Substance Use Topics   Alcohol use: Yes    Comment: rare   Drug use: No   Allergies Patient has no known allergies.  Review of Systems Review of Systems  All other systems reviewed and are negative.   Physical Exam Vital Signs  I have reviewed the triage vital signs BP (!) 139/93 (BP Location: Left Arm)   Pulse 78   Temp 98 F (36.7 C) (Oral)   Resp 18   Ht  5\' 5"  (1.651 m)   Wt 61.6 kg  SpO2 100%   BMI 22.60 kg/m  Physical Exam Vitals and nursing note reviewed.  Constitutional:      General: She is not in acute distress.    Appearance: She is well-developed.  HENT:     Head: Normocephalic and atraumatic.     Mouth/Throat:     Mouth: Mucous membranes are moist.  Eyes:     Pupils: Pupils are equal, round, and reactive to light.  Cardiovascular:     Rate and Rhythm: Normal rate and regular rhythm.     Heart sounds: No murmur heard. Pulmonary:     Effort: Pulmonary effort is normal. No respiratory distress.     Breath sounds: Normal breath sounds.  Abdominal:     General: Abdomen is flat.     Palpations: Abdomen is soft.     Tenderness: There is abdominal tenderness (mild diffuse).  Musculoskeletal:        General: No tenderness.     Right lower leg: No edema.     Left lower leg: No edema.     Comments: No focal tenderness to the bilateral upper and lower extremities, no deformity or injury no midline C, T spine tenderness.  Mild midline L-spine tenderness.  Skin:    General: Skin is warm and dry.     Comments: Small skin tear to the right dorsal wrist  Neurological:     General: No focal deficit present.     Mental Status: She is alert. Mental status is at baseline.  Psychiatric:        Mood and Affect: Mood normal.        Behavior: Behavior normal.     ED Results and Treatments Labs (all labs ordered are listed, but only abnormal results are displayed) Labs Reviewed  RESP PANEL BY RT-PCR (FLU A&B, COVID) ARPGX2  COMPREHENSIVE METABOLIC PANEL  CBC WITH DIFFERENTIAL/PLATELET  URINALYSIS, ROUTINE W REFLEX MICROSCOPIC  LIPASE, BLOOD  TROPONIN I (HIGH SENSITIVITY)                                                                                                                          Radiology No results found.  Pertinent labs & imaging results that were available during my care of the patient were reviewed by me and  considered in my medical decision making (see MDM for details).  Medications Ordered in ED Medications - No data to display  Procedures Procedures  (including critical care time)  Medical Decision Making / ED Course   MDM:  76 year old female presenting to the emergency department with nausea and vomiting.  Patient overall well-appearing, vital signs reassuring.  Only sign of traumatic injury is mild tenderness to L-spine as well as skin tear to the right wrist without tenderness or limited range of motion.  EKG shows no acute ST or T wave changes.  We will obtain lab testing given decreased p.o. intake for few days as well as nausea and vomiting.  Weakness could be from electrolyte disturbance or toxic, metabolic causes.  Will obtain CT abdomen given abdominal pain on exam as well as nausea and vomiting.  Will obtain troponin given generalized weakness although EKG reassuring, doubt ACS.  Will reassess.  We will also obtain COVID given symptoms.      Additional history obtained: -Additional history obtained from {wsadditionalhistorian:28072} -External records from outside source obtained and reviewed including: Chart review including previous notes, labs, imaging, consultation notes   Lab Tests: -I ordered, reviewed, and interpreted labs.   The pertinent results include:   Labs Reviewed  RESP PANEL BY RT-PCR (FLU A&B, COVID) ARPGX2  COMPREHENSIVE METABOLIC PANEL  CBC WITH DIFFERENTIAL/PLATELET  URINALYSIS, ROUTINE W REFLEX MICROSCOPIC  LIPASE, BLOOD  TROPONIN I (HIGH SENSITIVITY)      EKG   EKG Interpretation  Date/Time:    Ventricular Rate:    PR Interval:    QRS Duration:   QT Interval:    QTC Calculation:   R Axis:     Text Interpretation:           Imaging Studies ordered: I ordered imaging studies including *** On my  interpretation imaging demonstrates *** I independently visualized and interpreted imaging. I agree with the radiologist interpretation   Medicines ordered and prescription drug management: No orders of the defined types were placed in this encounter.   -I have reviewed the patients home medicines and have made adjustments as needed   Consultations Obtained: I requested consultation with the ***,  and discussed lab and imaging findings as well as pertinent plan - they recommend: ***   Cardiac Monitoring: The patient was maintained on a cardiac monitor.  I personally viewed and interpreted the cardiac monitored which showed an underlying rhythm of: ***  Social Determinants of Health:  Factors impacting patients care include: {wssoc:28071}   Reevaluation: After the interventions noted above, I reevaluated the patient and found that they have {resolved/improved/worsened:23923::"improved"}  Co morbidities that complicate the patient evaluation  Past Medical History:  Diagnosis Date   Allergic rhinitis    Anxiety    Elevated TSH    pt denies   Emphysema lung (HCC)    Enlarged lymph nodes    in chest   GERD (gastroesophageal reflux disease)    Goals of care, counseling/discussion 10/21/2016   History of hiatal hernia    History of radiation therapy 03/10/17- 03/21/17   Whole brain, prophylactic, radiation 25 Gy in 10 fractions   History of radiation therapy 11/18/16- 01/16/17   59.4 Gy to lung in 33 fractions.   Hyperlipidemia    IFG (impaired fasting glucose)    Osteopenia    Recurrent major depressive disorder (HCC)    Sleep apnea    mild no cpap needed   Small cell carcinoma of right lung ([place]) 2018   surgery right middle lobe removed    Tobacco dependence    Varicose veins of both lower extremities  Vitamin D deficiency       Dispostion: {wsdispo:28070}    Final Clinical Impression(s) / ED Diagnoses Final diagnoses:  None     This chart was dictated using  voice recognition software.  Despite best efforts to proofread,  errors can occur which can change the documentation meaning.

## 2021-09-21 NOTE — ED Notes (Signed)
Patient transported to CT 

## 2021-09-22 ENCOUNTER — Encounter (HOSPITAL_COMMUNITY): Payer: Self-pay

## 2021-09-22 DIAGNOSIS — R531 Weakness: Secondary | ICD-10-CM

## 2021-09-22 LAB — URINALYSIS, ROUTINE W REFLEX MICROSCOPIC
Bilirubin Urine: NEGATIVE
Glucose, UA: NEGATIVE mg/dL
Ketones, ur: NEGATIVE mg/dL
Leukocytes,Ua: NEGATIVE
Nitrite: POSITIVE — AB
Protein, ur: NEGATIVE mg/dL
Specific Gravity, Urine: 1.01 (ref 1.005–1.030)
pH: 8.5 — ABNORMAL HIGH (ref 5.0–8.0)

## 2021-09-22 LAB — URINALYSIS, MICROSCOPIC (REFLEX)

## 2021-09-22 MED ORDER — CALCIUM CARBONATE ANTACID 500 MG PO CHEW
1.0000 | CHEWABLE_TABLET | Freq: Once | ORAL | Status: AC
  Administered 2021-09-22: 200 mg via ORAL
  Filled 2021-09-22: qty 1

## 2021-09-22 MED ORDER — SODIUM CHLORIDE 0.9 % IV SOLN
1.0000 g | Freq: Once | INTRAVENOUS | Status: AC
  Administered 2021-09-22: 1 g via INTRAVENOUS
  Filled 2021-09-22: qty 10

## 2021-09-22 MED ORDER — POTASSIUM CHLORIDE CRYS ER 20 MEQ PO TBCR: 20.0000 meq | EXTENDED_RELEASE_TABLET | Freq: Two times a day (BID) | ORAL | 0 refills | Status: DC

## 2021-09-22 MED ORDER — CEPHALEXIN 500 MG PO CAPS: 500.0000 mg | ORAL_CAPSULE | Freq: Two times a day (BID) | ORAL | 0 refills | Status: AC

## 2021-09-22 NOTE — ED Notes (Signed)
Pt request to speak to nurse. States she does not want to be admitted anymore. Wants to rearrange transportation to home residence until son can arrive in the morning.

## 2021-09-22 NOTE — ED Notes (Signed)
Ambulated short distance with 1 assist. Uses a roller walker at home.

## 2021-09-22 NOTE — ED Notes (Signed)
[facility] notified for pt transfer.  

## 2021-09-22 NOTE — ED Notes (Signed)
Pt request admission due to weakness and inability to care for self at home. MD updated and speaking with pt.

## 2021-09-22 NOTE — ED Provider Notes (Addendum)
Care of the patient assumed at the change of shift. Here for general weakness and fall. Found to be hypokalemic, repleted orally. Awaiting UA which is suspicious for UTI given nitrates and bacteria, she reports some dysuria to me, otherwise she is feeling better.  Will give Rocephin here but she still reports being very weak. Unable to get up and walk more than a few feet even with assistance. She has not had much PO intake in the last few days. She lives at home alone. Will discuss admission with the hospitalist.   Spoke with Dr. Marlyce Huge, Hospitalist, who will accept for admission    3:10 AM Patient now states she does not want to be admitted. She reports her son is going to come from [place] in the morning to care for her. This was the initial plan before she changed her mind and asked to be admitted. She is sure this is what she wants to do now. [facility] called to transport. Rx for Keflex and K-dur sent.   Truddie Hidden, MD 09/22/21 2761    Truddie Hidden, MD 09/22/21 [phone]

## 2021-12-10 ENCOUNTER — Emergency Department (HOSPITAL_COMMUNITY)
Admission: EM | Admit: 2021-12-10 | Discharge: 2021-12-11 | Disposition: A | Discharge status: Home / Self Care | Payer: Medicare PPO | Attending: Emergency Medicine | Admitting: Emergency Medicine

## 2021-12-10 ENCOUNTER — Other Ambulatory Visit: Payer: Self-pay

## 2021-12-10 ENCOUNTER — Encounter (HOSPITAL_COMMUNITY): Payer: Self-pay

## 2021-12-10 DIAGNOSIS — R1084 Generalized abdominal pain: Secondary | ICD-10-CM | POA: Insufficient documentation

## 2021-12-10 DIAGNOSIS — R5383 Other fatigue: Secondary | ICD-10-CM

## 2021-12-10 DIAGNOSIS — R112 Nausea with vomiting, unspecified: Secondary | ICD-10-CM | POA: Insufficient documentation

## 2021-12-10 DIAGNOSIS — R8290 Unspecified abnormal findings in urine: Secondary | ICD-10-CM

## 2021-12-10 DIAGNOSIS — Z1152 Encounter for screening for COVID-19: Secondary | ICD-10-CM | POA: Diagnosis not present

## 2021-12-10 DIAGNOSIS — J449 Chronic obstructive pulmonary disease, unspecified: Secondary | ICD-10-CM | POA: Insufficient documentation

## 2021-12-10 LAB — CBC
HCT: 42.2 % (ref 36.0–46.0)
Hemoglobin: 13.9 g/dL (ref 12.0–15.0)
MCH: 29.9 pg (ref 26.0–34.0)
MCHC: 32.9 g/dL (ref 30.0–36.0)
MCV: 90.8 fL (ref 80.0–100.0)
Platelets: 300 10*3/uL (ref 150–400)
RBC: 4.65 MIL/uL (ref 3.87–5.11)
RDW: 14.2 % (ref 11.5–15.5)
WBC: 9 10*3/uL (ref 4.0–10.5)
nRBC: 0 % (ref 0.0–0.2)

## 2021-12-10 LAB — COMPREHENSIVE METABOLIC PANEL
ALT: 15 U/L (ref 0–44)
AST: 22 U/L (ref 15–41)
Albumin: 4.5 g/dL (ref 3.5–5.0)
Alkaline Phosphatase: 66 U/L (ref 38–126)
Anion gap: 14 (ref 5–15)
BUN: 40 mg/dL — ABNORMAL HIGH (ref 8–23)
CO2: 29 mmol/L (ref 22–32)
Calcium: 10.5 mg/dL — ABNORMAL HIGH (ref 8.9–10.3)
Chloride: 95 mmol/L — ABNORMAL LOW (ref 98–111)
Creatinine, Ser: 1.25 mg/dL — ABNORMAL HIGH (ref 0.44–1.00)
GFR, Estimated: 44 mL/min — ABNORMAL LOW (ref >60)
Glucose, Bld: 108 mg/dL — ABNORMAL HIGH (ref 70–99)
Potassium: 3.1 mmol/L — ABNORMAL LOW (ref 3.5–5.1)
Sodium: 138 mmol/L (ref 135–145)
Total Bilirubin: 0.9 mg/dL (ref 0.3–1.2)
Total Protein: 9 g/dL — ABNORMAL HIGH (ref 6.5–8.1)

## 2021-12-10 LAB — LIPASE, BLOOD: Lipase: 34 U/L (ref 11–51)

## 2021-12-10 NOTE — ED Triage Notes (Signed)
Patient BIB [facility] from home. Nausea and vomiting for 4 days.   EMS 4mg  zofran 20G Left AC

## 2021-12-10 NOTE — ED Provider Triage Note (Signed)
Emergency Medicine Provider Triage Evaluation Note  Robyn Barton , a 76 y.o. female  was evaluated in triage.  Pt complains of nausea and vomiting.  Symptoms have been present for the past 4 days.  She has been unable to eat or drink anything at that time.  She states she has been dealing with this intermittently for the last 5 years after a surgery.  Denies chest pain, shortness of breath, abdominal pain, diarrhea, melena, hematochezia, hematemesis.  States she is feeling weak at this time which is how she normally gets when she has these episodes.  Review of Systems  Positive: As above Negative: As above  Physical Exam  BP 121/64 (BP Location: Left Arm)   Pulse 90   Temp 97.9 F (36.6 C) (Oral)   Resp 15   SpO2 99%  Gen:   Awake, no distress   Resp:  Normal effort  MSK:   Moves extremities without difficulty  Other:    Medical Decision Making  Medically screening exam initiated at 5:44 PM.  Appropriate orders placed.  Robyn Barton was informed that the remainder of the evaluation will be completed by another provider, this initial triage assessment does not replace that evaluation, and the importance of remaining in the ED until their evaluation is complete.     Roylene Reason, [place] 12/10/21 1745

## 2021-12-11 ENCOUNTER — Emergency Department (HOSPITAL_COMMUNITY): Payer: Medicare PPO

## 2021-12-11 LAB — URINALYSIS, ROUTINE W REFLEX MICROSCOPIC
Bilirubin Urine: NEGATIVE
Glucose, UA: NEGATIVE mg/dL
Ketones, ur: 20 mg/dL — AB
Nitrite: POSITIVE — AB
Protein, ur: NEGATIVE mg/dL
Specific Gravity, Urine: 1.028 (ref 1.005–1.030)
pH: 5 (ref 5.0–8.0)

## 2021-12-11 LAB — RESP PANEL BY RT-PCR (FLU A&B, COVID) ARPGX2
Influenza A by PCR: NEGATIVE
Influenza B by PCR: NEGATIVE
SARS Coronavirus 2 by RT PCR: NEGATIVE

## 2021-12-11 MED ORDER — ONDANSETRON HCL 4 MG/2ML IJ SOLN
4.0000 mg | Freq: Once | INTRAMUSCULAR | Status: AC
  Administered 2021-12-11: 4 mg via INTRAVENOUS
  Filled 2021-12-11: qty 2

## 2021-12-11 MED ORDER — LORAZEPAM 2 MG/ML IJ SOLN
0.5000 mg | Freq: Once | INTRAMUSCULAR | Status: AC
  Administered 2021-12-11: 0.5 mg via INTRAVENOUS
  Filled 2021-12-11: qty 1

## 2021-12-11 MED ORDER — SODIUM CHLORIDE (PF) 0.9 % IJ SOLN
INTRAMUSCULAR | Status: AC
  Filled 2021-12-11: qty 50

## 2021-12-11 MED ORDER — IPRATROPIUM-ALBUTEROL 0.5-2.5 (3) MG/3ML IN SOLN
3.0000 mL | Freq: Once | RESPIRATORY_TRACT | Status: AC
  Administered 2021-12-11: 3 mL via RESPIRATORY_TRACT
  Filled 2021-12-11: qty 6

## 2021-12-11 MED ORDER — LACTATED RINGERS IV SOLN: INTRAVENOUS | Status: DC

## 2021-12-11 MED ORDER — ALBUTEROL SULFATE HFA 108 (90 BASE) MCG/ACT IN AERS
2.0000 | INHALATION_SPRAY | RESPIRATORY_TRACT | Status: DC | PRN
  Administered 2021-12-11: 2 via RESPIRATORY_TRACT
  Filled 2021-12-11: qty 6.7

## 2021-12-11 MED ORDER — POTASSIUM CHLORIDE CRYS ER 20 MEQ PO TBCR
40.0000 meq | EXTENDED_RELEASE_TABLET | Freq: Once | ORAL | Status: AC
  Administered 2021-12-11: 40 meq via ORAL
  Filled 2021-12-11: qty 2

## 2021-12-11 MED ORDER — SUCRALFATE 1 G PO TABS: 1.0000 g | ORAL_TABLET | Freq: Three times a day (TID) | ORAL | 0 refills | Status: DC

## 2021-12-11 MED ORDER — IOHEXOL 300 MG/ML  SOLN
75.0000 mL | Freq: Once | INTRAMUSCULAR | Status: AC | PRN
  Administered 2021-12-11: 75 mL via INTRAVENOUS

## 2021-12-11 MED ORDER — ONDANSETRON 4 MG PO TBDP: 4.0000 mg | ORAL_TABLET | Freq: Three times a day (TID) | ORAL | 0 refills | Status: DC | PRN

## 2021-12-11 MED ORDER — CEPHALEXIN 500 MG PO CAPS: 500.0000 mg | ORAL_CAPSULE | Freq: Two times a day (BID) | ORAL | 0 refills | Status: DC

## 2021-12-11 NOTE — ED Notes (Addendum)
Pt expressed that she has nobody to come and pick her up. Family lives out of town, and pt is unable to arrange transportation home. [facility] will be called for transport.

## 2021-12-11 NOTE — ED Notes (Signed)
Pt tolerating PO fluids with little issue.

## 2021-12-11 NOTE — ED Notes (Signed)
I provided reinforced discharge education based off of after visit summary/care provided. Pt acknowledged and understood my education. Pt had no further questions/concerns for provider/myself. After visit summary provided to pt.

## 2021-12-11 NOTE — ED Notes (Signed)
Pt advises unable to void without I&O cath, pt consents to I&O cath.

## 2021-12-11 NOTE — Discharge Instructions (Signed)
You urine is concerning for possible infection.  Take antibiotics as prescribed.  I've sent a prescription for nausea medicine as well as medicine to help with your acid reflux.   One of our social workers should get in contact with you about having a home health evaluation in the next day or two.

## 2021-12-11 NOTE — ED Notes (Signed)
[id number] non-emergency called, arranged [facility] for transport.

## 2021-12-11 NOTE — ED Provider Notes (Signed)
[facility] Emergency Department Provider Note MRN:  [id number]  Arrival date & time: 12/11/21     Chief Complaint   Nausea   History of Present Illness   Robyn Barton is a 76 y.o. year-old female presents to the ED with chief complaint of nausea and vomiting for the past 4 days.  She denies fever, chills, or diarrhea.  She states that she also has COPD and feels like she is having a flare up.  She reports that she is out of her inhaler.  She denies fevers or chills.  She states that she has had theses symptoms before and has been seen by GI, but "nobody can figure out what's wrong."   History provided by patient.   Review of Systems  Pertinent positive and negative review of systems noted in HPI.    Physical Exam   Vitals:   12/11/21 0523 12/11/21 0530  BP:  136/75  Pulse:  85  Resp:  18  Temp: 98 F (36.7 C)   SpO2:  96%    CONSTITUTIONAL:  non toxic-appearing, NAD NEURO:  Alert and oriented x 3, CN 3-12 grossly intact EYES:  eyes equal and reactive ENT/NECK:  Supple, no stridor  CARDIO:  normal rate, regular rhythm, appears well-perfused  PULM:  No respiratory distress,  GI/GU:  non-distended, mild lower abdominal tenderness MSK/SPINE:  No gross deformities, no edema, moves all extremities  SKIN:  no rash, atraumatic   *Additional and/or pertinent findings included in MDM below  Diagnostic and Interventional Summary    EKG Interpretation  Date/Time:    Ventricular Rate:    PR Interval:    QRS Duration:   QT Interval:    QTC Calculation:   R Axis:     Text Interpretation:         Labs Reviewed  COMPREHENSIVE METABOLIC PANEL - Abnormal; Notable for the following components:      Result Value   Potassium 3.1 (*)    Chloride 95 (*)    Glucose, Bld 108 (*)    BUN 40 (*)    Creatinine, Ser 1.25 (*)    Calcium 10.5 (*)    Total Protein 9.0 (*)    GFR, Estimated 44 (*)    All other components within normal limits   URINALYSIS, ROUTINE W REFLEX MICROSCOPIC - Abnormal; Notable for the following components:   APPearance HAZY (*)    Hgb urine dipstick SMALL (*)    Ketones, ur 20 (*)    Nitrite POSITIVE (*)    Leukocytes,Ua SMALL (*)    Bacteria, UA RARE (*)    All other components within normal limits  RESP PANEL BY RT-PCR (FLU A&B, COVID) ARPGX2  LIPASE, BLOOD  CBC    CT ABDOMEN PELVIS W CONTRAST  Final Result    DG Chest 2 View  Final Result      Medications  lactated ringers infusion ( Intravenous Infusion Verify 12/11/21 0531)  albuterol (VENTOLIN HFA) 108 (90 Base) MCG/ACT inhaler 2 puff (has no administration in time range)  ipratropium-albuterol (DUONEB) 0.5-2.5 (3) MG/3ML nebulizer solution 3 mL (3 mLs Nebulization Given 12/11/21 0210)  potassium chloride SA (KLOR-CON M) CR tablet 40 mEq (40 mEq Oral Given 12/11/21 0206)  ondansetron (ZOFRAN) injection 4 mg (4 mg Intravenous Given 12/11/21 0200)  iohexol (OMNIPAQUE) 300 MG/ML solution 75 mL (75 mLs Intravenous Contrast Given 12/11/21 0138)  sodium chloride (PF) 0.9 % injection (  Given by Other 12/11/21 0143)  LORazepam (ATIVAN)  injection 0.5 mg (0.5 mg Intravenous Given 12/11/21 8110)     Procedures  /  Critical Care Procedures  ED Course and Medical Decision Making  I have reviewed the triage vital signs, the nursing notes, and pertinent available records from the EMR.  Social Determinants Affecting Complexity of Care: Patient has problems living alone.   ED Course:    Medical Decision Making Patient here with nausea and vomiting for the past 4 days.  She looks pretty well on exam.  Doesn't appear acutely dehydrated.  Will give some fluid and await labs.  She has had some generalized lower abdominal discomfort, which I will obtain a CT for further evaluation.  She does sound a bit wheezes.  Will give a neb and plan for discharge with an inhaler.  UA is abnormal, but no fever, no reported dysuria, or leukocytosis.   Nevertheless, will cover with keflex.  Patient complains about her ongoing stomach and acid reflux problems.  Will give carafate that she can take along with her omeprazole.  Zofran ordered for nausea.  She'd likely benefit from living in assisted living or nursing home.  We discussed that this is something she will need to talk with her doctor and family about.  She doesn't appear toxic.  Vitals are stable.    Patient seen by and discussed with Dr. Sedonia Small, who recommends discharge.  I've place home health consult.     Amount and/or Complexity of Data Reviewed Labs: ordered. Radiology: ordered.  Risk Prescription drug management.     Consultants: No consultations were needed in caring for this patient.   Treatment and Plan: I considered admission due to patient's initial presentation, but after considering the examination and diagnostic results, patient will not require admission and can be discharged with outpatient follow-up.  Patient seen by and discussed with attending physician, Dr. Sedonia Small, who recommends giving a small dose of ativan and plan for discharge.  Will consult CM for home health needs.  Final Clinical Impressions(s) / ED Diagnoses     ICD-10-CM   1. Fatigue, unspecified type  R53.83     2. Nausea and vomiting, unspecified vomiting type  R11.2     3. Abnormal urinalysis  R82.90       ED Discharge Orders          Ordered    cephALEXin (KEFLEX) 500 MG capsule  2 times daily        12/11/21 0514    ondansetron (ZOFRAN-ODT) 4 MG disintegrating tablet  Every 8 hours PRN        12/11/21 0514    sucralfate (CARAFATE) 1 g tablet  3 times daily with meals & bedtime        12/11/21 0514              Discharge Instructions Discussed with and Provided to Patient:     Discharge Instructions      You urine is concerning for possible infection.  Take antibiotics as prescribed.  I've sent a prescription for nausea medicine as well as medicine to help with your  acid reflux.   One of our social workers should get in contact with you about having a home health evaluation in the next day or two.       Montine Circle, PA-C 12/11/21 3159    Maudie Flakes, MD 12/11/21 [phone]

## 2021-12-11 NOTE — ED Notes (Signed)
Pt transport to imaging

## 2022-07-27 ENCOUNTER — Other Ambulatory Visit
Admission: RE | Admit: 2022-07-27 | Discharge: 2022-07-27 | Disposition: A | Discharge status: Home / Self Care | Payer: Medicare PPO | Source: Ambulatory Visit | Attending: *Deleted | Admitting: *Deleted

## 2022-07-27 DIAGNOSIS — J441 Chronic obstructive pulmonary disease with (acute) exacerbation: Secondary | ICD-10-CM | POA: Diagnosis not present

## 2022-07-27 DIAGNOSIS — A4159 Other Gram-negative sepsis: Secondary | ICD-10-CM | POA: Diagnosis not present

## 2022-07-27 DIAGNOSIS — K920 Hematemesis: Secondary | ICD-10-CM | POA: Insufficient documentation

## 2022-07-27 DIAGNOSIS — R4182 Altered mental status, unspecified: Secondary | ICD-10-CM | POA: Insufficient documentation

## 2022-07-27 DIAGNOSIS — J439 Emphysema, unspecified: Secondary | ICD-10-CM | POA: Insufficient documentation

## 2022-07-27 DIAGNOSIS — C3490 Malignant neoplasm of unspecified part of unspecified bronchus or lung: Secondary | ICD-10-CM | POA: Insufficient documentation

## 2022-07-27 DIAGNOSIS — F39 Unspecified mood [affective] disorder: Secondary | ICD-10-CM | POA: Insufficient documentation

## 2022-07-27 LAB — URINALYSIS, COMPLETE (UACMP) WITH MICROSCOPIC
Bilirubin Urine: NEGATIVE
Glucose, UA: NEGATIVE mg/dL
Ketones, ur: 5 mg/dL — AB
Nitrite: NEGATIVE
Protein, ur: 100 mg/dL — AB
Specific Gravity, Urine: 1.014 (ref 1.005–1.030)
Squamous Epithelial / HPF: NONE SEEN /HPF (ref 0–5)
WBC, UA: >50 WBC/hpf (ref 0–5)
pH: 8 (ref 5.0–8.0)

## 2022-07-29 ENCOUNTER — Inpatient Hospital Stay
Admission: EM | Admit: 2022-07-29 | Discharge: 2022-08-02 | DRG: 871 | Disposition: A | Discharge status: Hospice | Payer: Medicare PPO | Source: Skilled Nursing Facility | Attending: Hospitalist | Admitting: Hospitalist

## 2022-07-29 ENCOUNTER — Emergency Department: Payer: Medicare PPO

## 2022-07-29 ENCOUNTER — Encounter: Payer: Self-pay | Admitting: Internal Medicine

## 2022-07-29 DIAGNOSIS — E440 Moderate protein-calorie malnutrition: Secondary | ICD-10-CM | POA: Diagnosis not present

## 2022-07-29 DIAGNOSIS — E876 Hypokalemia: Secondary | ICD-10-CM | POA: Diagnosis present

## 2022-07-29 DIAGNOSIS — Z681 Body mass index (BMI) 19 or less, adult: Secondary | ICD-10-CM | POA: Diagnosis not present

## 2022-07-29 DIAGNOSIS — Z66 Do not resuscitate: Secondary | ICD-10-CM | POA: Diagnosis present

## 2022-07-29 DIAGNOSIS — R64 Cachexia: Secondary | ICD-10-CM | POA: Diagnosis present

## 2022-07-29 DIAGNOSIS — I129 Hypertensive chronic kidney disease with stage 1 through stage 4 chronic kidney disease, or unspecified chronic kidney disease: Secondary | ICD-10-CM | POA: Diagnosis present

## 2022-07-29 DIAGNOSIS — J439 Emphysema, unspecified: Secondary | ICD-10-CM | POA: Diagnosis present

## 2022-07-29 DIAGNOSIS — E43 Unspecified severe protein-calorie malnutrition: Secondary | ICD-10-CM | POA: Diagnosis present

## 2022-07-29 DIAGNOSIS — Z7951 Long term (current) use of inhaled steroids: Secondary | ICD-10-CM

## 2022-07-29 DIAGNOSIS — Z8249 Family history of ischemic heart disease and other diseases of the circulatory system: Secondary | ICD-10-CM | POA: Diagnosis not present

## 2022-07-29 DIAGNOSIS — L89152 Pressure ulcer of sacral region, stage 2: Secondary | ICD-10-CM | POA: Diagnosis present

## 2022-07-29 DIAGNOSIS — E785 Hyperlipidemia, unspecified: Secondary | ICD-10-CM | POA: Diagnosis present

## 2022-07-29 DIAGNOSIS — J9601 Acute respiratory failure with hypoxia: Secondary | ICD-10-CM | POA: Diagnosis present

## 2022-07-29 DIAGNOSIS — N3000 Acute cystitis without hematuria: Secondary | ICD-10-CM | POA: Diagnosis not present

## 2022-07-29 DIAGNOSIS — A419 Sepsis, unspecified organism: Secondary | ICD-10-CM | POA: Diagnosis not present

## 2022-07-29 DIAGNOSIS — E872 Acidosis, unspecified: Secondary | ICD-10-CM | POA: Diagnosis present

## 2022-07-29 DIAGNOSIS — Z818 Family history of other mental and behavioral disorders: Secondary | ICD-10-CM

## 2022-07-29 DIAGNOSIS — J189 Pneumonia, unspecified organism: Secondary | ICD-10-CM | POA: Diagnosis present

## 2022-07-29 DIAGNOSIS — Z1152 Encounter for screening for COVID-19: Secondary | ICD-10-CM | POA: Diagnosis not present

## 2022-07-29 DIAGNOSIS — F418 Other specified anxiety disorders: Secondary | ICD-10-CM | POA: Diagnosis not present

## 2022-07-29 DIAGNOSIS — I10 Essential (primary) hypertension: Secondary | ICD-10-CM | POA: Diagnosis not present

## 2022-07-29 DIAGNOSIS — R6520 Severe sepsis without septic shock: Secondary | ICD-10-CM | POA: Diagnosis present

## 2022-07-29 DIAGNOSIS — R7989 Other specified abnormal findings of blood chemistry: Secondary | ICD-10-CM | POA: Diagnosis present

## 2022-07-29 DIAGNOSIS — F32A Depression, unspecified: Secondary | ICD-10-CM | POA: Diagnosis present

## 2022-07-29 DIAGNOSIS — J9602 Acute respiratory failure with hypercapnia: Secondary | ICD-10-CM | POA: Diagnosis present

## 2022-07-29 DIAGNOSIS — N1831 Chronic kidney disease, stage 3a: Secondary | ICD-10-CM | POA: Diagnosis present

## 2022-07-29 DIAGNOSIS — C3411 Malignant neoplasm of upper lobe, right bronchus or lung: Secondary | ICD-10-CM | POA: Diagnosis present

## 2022-07-29 DIAGNOSIS — Z825 Family history of asthma and other chronic lower respiratory diseases: Secondary | ICD-10-CM

## 2022-07-29 DIAGNOSIS — Z79899 Other long term (current) drug therapy: Secondary | ICD-10-CM | POA: Diagnosis not present

## 2022-07-29 DIAGNOSIS — Z96642 Presence of left artificial hip joint: Secondary | ICD-10-CM | POA: Diagnosis present

## 2022-07-29 DIAGNOSIS — N390 Urinary tract infection, site not specified: Secondary | ICD-10-CM | POA: Diagnosis present

## 2022-07-29 DIAGNOSIS — D509 Iron deficiency anemia, unspecified: Secondary | ICD-10-CM | POA: Diagnosis present

## 2022-07-29 DIAGNOSIS — Z923 Personal history of irradiation: Secondary | ICD-10-CM

## 2022-07-29 DIAGNOSIS — Z87891 Personal history of nicotine dependence: Secondary | ICD-10-CM

## 2022-07-29 DIAGNOSIS — R7881 Bacteremia: Secondary | ICD-10-CM | POA: Diagnosis present

## 2022-07-29 DIAGNOSIS — K219 Gastro-esophageal reflux disease without esophagitis: Secondary | ICD-10-CM | POA: Diagnosis present

## 2022-07-29 DIAGNOSIS — Z90710 Acquired absence of both cervix and uterus: Secondary | ICD-10-CM

## 2022-07-29 DIAGNOSIS — Z515 Encounter for palliative care: Secondary | ICD-10-CM | POA: Diagnosis not present

## 2022-07-29 DIAGNOSIS — A4159 Other Gram-negative sepsis: Principal | ICD-10-CM | POA: Diagnosis present

## 2022-07-29 DIAGNOSIS — J441 Chronic obstructive pulmonary disease with (acute) exacerbation: Secondary | ICD-10-CM | POA: Diagnosis present

## 2022-07-29 DIAGNOSIS — E559 Vitamin D deficiency, unspecified: Secondary | ICD-10-CM | POA: Diagnosis present

## 2022-07-29 DIAGNOSIS — R012 Other cardiac sounds: Secondary | ICD-10-CM | POA: Diagnosis not present

## 2022-07-29 DIAGNOSIS — Z83438 Family history of other disorder of lipoprotein metabolism and other lipidemia: Secondary | ICD-10-CM

## 2022-07-29 DIAGNOSIS — L8990 Pressure ulcer of unspecified site, unspecified stage: Secondary | ICD-10-CM | POA: Diagnosis present

## 2022-07-29 HISTORY — DX: Essential (primary) hypertension: I10

## 2022-07-29 LAB — COMPREHENSIVE METABOLIC PANEL
ALT: 13 U/L (ref 0–44)
AST: 23 U/L (ref 15–41)
Albumin: 2.6 g/dL — ABNORMAL LOW (ref 3.5–5.0)
Alkaline Phosphatase: 105 U/L (ref 38–126)
Anion gap: 12 (ref 5–15)
BUN: 52 mg/dL — ABNORMAL HIGH (ref 8–23)
CO2: 20 mmol/L — ABNORMAL LOW (ref 22–32)
Calcium: 9.9 mg/dL (ref 8.9–10.3)
Chloride: 106 mmol/L (ref 98–111)
Creatinine, Ser: 1.41 mg/dL — ABNORMAL HIGH (ref 0.44–1.00)
GFR, Estimated: 38 mL/min — ABNORMAL LOW (ref >60)
Glucose, Bld: 119 mg/dL — ABNORMAL HIGH (ref 70–99)
Potassium: 3.9 mmol/L (ref 3.5–5.1)
Sodium: 138 mmol/L (ref 135–145)
Total Bilirubin: 0.5 mg/dL (ref 0.3–1.2)
Total Protein: 9.1 g/dL — ABNORMAL HIGH (ref 6.5–8.1)

## 2022-07-29 LAB — APTT: aPTT: 29 seconds (ref 24–36)

## 2022-07-29 LAB — RESP PANEL BY RT-PCR (RSV, FLU A&B, COVID)  RVPGX2
Influenza A by PCR: NEGATIVE
Influenza B by PCR: NEGATIVE
Resp Syncytial Virus by PCR: NEGATIVE
SARS Coronavirus 2 by RT PCR: NEGATIVE

## 2022-07-29 LAB — CBC WITH DIFFERENTIAL/PLATELET
Abs Immature Granulocytes: 0.4 10*3/uL — ABNORMAL HIGH (ref 0.00–0.07)
Basophils Absolute: 0.1 10*3/uL (ref 0.0–0.1)
Basophils Relative: 1 %
Eosinophils Absolute: 0.1 10*3/uL (ref 0.0–0.5)
Eosinophils Relative: 0 %
HCT: 38.2 % (ref 36.0–46.0)
Hemoglobin: 11.4 g/dL — ABNORMAL LOW (ref 12.0–15.0)
Immature Granulocytes: 2 %
Lymphocytes Relative: 5 %
Lymphs Abs: 1.3 10*3/uL (ref 0.7–4.0)
MCH: 22.8 pg — ABNORMAL LOW (ref 26.0–34.0)
MCHC: 29.8 g/dL — ABNORMAL LOW (ref 30.0–36.0)
MCV: 76.2 fL — ABNORMAL LOW (ref 80.0–100.0)
Monocytes Absolute: 1.6 10*3/uL — ABNORMAL HIGH (ref 0.1–1.0)
Monocytes Relative: 6 %
Neutro Abs: 22.9 10*3/uL — ABNORMAL HIGH (ref 1.7–7.7)
Neutrophils Relative %: 86 %
Platelets: 370 10*3/uL (ref 150–400)
RBC: 5.01 MIL/uL (ref 3.87–5.11)
RDW: 18.9 % — ABNORMAL HIGH (ref 11.5–15.5)
Smear Review: NORMAL
WBC: 26.3 10*3/uL — ABNORMAL HIGH (ref 4.0–10.5)
nRBC: 0 % (ref 0.0–0.2)

## 2022-07-29 LAB — URINALYSIS, W/ REFLEX TO CULTURE (INFECTION SUSPECTED)
Bilirubin Urine: NEGATIVE
Glucose, UA: NEGATIVE mg/dL
Ketones, ur: 5 mg/dL — AB
Nitrite: NEGATIVE
Protein, ur: >=300 mg/dL — AB
RBC / HPF: >50 RBC/hpf (ref 0–5)
Specific Gravity, Urine: 1.017 (ref 1.005–1.030)
Squamous Epithelial / HPF: NONE SEEN /HPF (ref 0–5)
WBC, UA: >50 WBC/hpf (ref 0–5)
pH: 7 (ref 5.0–8.0)

## 2022-07-29 LAB — PROTIME-INR
INR: 1.4 — ABNORMAL HIGH (ref 0.8–1.2)
Prothrombin Time: 17.5 seconds — ABNORMAL HIGH (ref 11.4–15.2)

## 2022-07-29 LAB — URINE CULTURE: Culture: >=100000 — AB

## 2022-07-29 LAB — BLOOD GAS, VENOUS
Acid-base deficit: 5.4 mmol/L — ABNORMAL HIGH (ref 0.0–2.0)
Bicarbonate: 22.3 mmol/L (ref 20.0–28.0)
O2 Saturation: 52.2 %
Patient temperature: 37
pCO2, Ven: 52 mmHg (ref 44–60)
pH, Ven: 7.24 — ABNORMAL LOW (ref 7.25–7.43)
pO2, Ven: 35 mmHg (ref 32–45)

## 2022-07-29 LAB — BRAIN NATRIURETIC PEPTIDE: B Natriuretic Peptide: 2190.5 pg/mL — ABNORMAL HIGH (ref 0.0–100.0)

## 2022-07-29 LAB — LACTIC ACID, PLASMA
Lactic Acid, Venous: 2.7 mmol/L (ref 0.5–1.9)
Lactic Acid, Venous: 3 mmol/L (ref 0.5–1.9)
Lactic Acid, Venous: 3.5 mmol/L (ref 0.5–1.9)
Lactic Acid, Venous: 2 mmol/L (ref 0.5–1.9)

## 2022-07-29 LAB — MRSA NEXT GEN BY PCR, NASAL: MRSA by PCR Next Gen: NOT DETECTED

## 2022-07-29 LAB — PROCALCITONIN: Procalcitonin: 30.79 ng/mL

## 2022-07-29 MED ORDER — ALBUTEROL SULFATE (2.5 MG/3ML) 0.083% IN NEBU: 2.5000 mg | INHALATION_SOLUTION | RESPIRATORY_TRACT | Status: DC | PRN

## 2022-07-29 MED ORDER — LACTATED RINGERS IV BOLUS
1000.0000 mL | Freq: Once | INTRAVENOUS | Status: AC
  Administered 2022-07-29: 1000 mL via INTRAVENOUS

## 2022-07-29 MED ORDER — SODIUM CHLORIDE 0.9 % IV BOLUS
500.0000 mL | Freq: Once | INTRAVENOUS | Status: AC
  Administered 2022-07-29: 500 mL via INTRAVENOUS

## 2022-07-29 MED ORDER — ONDANSETRON HCL 4 MG/2ML IJ SOLN
4.0000 mg | Freq: Three times a day (TID) | INTRAMUSCULAR | Status: DC | PRN
  Administered 2022-07-30: 4 mg via INTRAVENOUS
  Filled 2022-07-29: qty 2

## 2022-07-29 MED ORDER — ENSURE ENLIVE PO LIQD
237.0000 mL | Freq: Two times a day (BID) | ORAL | Status: DC
  Administered 2022-07-30 (×2): 237 mL via ORAL

## 2022-07-29 MED ORDER — ENOXAPARIN SODIUM 30 MG/0.3ML IJ SOSY
30.0000 mg | PREFILLED_SYRINGE | INTRAMUSCULAR | Status: DC
  Administered 2022-07-29 – 2022-08-01 (×4): 30 mg via SUBCUTANEOUS
  Filled 2022-07-29 (×4): qty 0.3

## 2022-07-29 MED ORDER — DM-GUAIFENESIN ER 30-600 MG PO TB12: 1.0000 | ORAL_TABLET | Freq: Two times a day (BID) | ORAL | Status: DC | PRN

## 2022-07-29 MED ORDER — HYDRALAZINE HCL 20 MG/ML IJ SOLN: 5.0000 mg | INTRAMUSCULAR | Status: DC | PRN

## 2022-07-29 MED ORDER — PANTOPRAZOLE SODIUM 40 MG PO TBEC
40.0000 mg | DELAYED_RELEASE_TABLET | Freq: Every day | ORAL | Status: DC
  Administered 2022-07-29 – 2022-08-01 (×4): 40 mg via ORAL
  Filled 2022-07-29 (×4): qty 1

## 2022-07-29 MED ORDER — IPRATROPIUM-ALBUTEROL 0.5-2.5 (3) MG/3ML IN SOLN
3.0000 mL | RESPIRATORY_TRACT | Status: DC
  Administered 2022-07-29 (×2): 3 mL via RESPIRATORY_TRACT
  Filled 2022-07-29 (×2): qty 3

## 2022-07-29 MED ORDER — ACETAMINOPHEN 325 MG PO TABS
650.0000 mg | ORAL_TABLET | Freq: Four times a day (QID) | ORAL | Status: DC | PRN
  Administered 2022-07-29 – 2022-08-01 (×3): 650 mg via ORAL
  Filled 2022-07-29 (×3): qty 2

## 2022-07-29 MED ORDER — ALBUMIN HUMAN 25 % IV SOLN
12.5000 g | Freq: Once | INTRAVENOUS | Status: AC
  Administered 2022-07-29: 12.5 g via INTRAVENOUS
  Filled 2022-07-29: qty 50

## 2022-07-29 MED ORDER — METHYLPREDNISOLONE SODIUM SUCC 40 MG IJ SOLR
40.0000 mg | Freq: Two times a day (BID) | INTRAMUSCULAR | Status: DC
  Administered 2022-07-29 – 2022-07-31 (×5): 40 mg via INTRAVENOUS
  Filled 2022-07-29 (×5): qty 1

## 2022-07-29 MED ORDER — SODIUM CHLORIDE 0.9 % IV SOLN
2.0000 g | INTRAVENOUS | Status: DC
  Administered 2022-07-29: 2 g via INTRAVENOUS
  Filled 2022-07-29 (×2): qty 12.5

## 2022-07-29 MED ORDER — SODIUM CHLORIDE 0.9 % IV SOLN
2.0000 g | INTRAVENOUS | Status: DC
  Administered 2022-07-29: 2 g via INTRAVENOUS
  Filled 2022-07-29: qty 20

## 2022-07-29 MED ORDER — MIDODRINE HCL 5 MG PO TABS: 10.0000 mg | ORAL_TABLET | Freq: Three times a day (TID) | ORAL | Status: DC

## 2022-07-29 MED ORDER — VANCOMYCIN HCL 1250 MG/250ML IV SOLN
1250.0000 mg | Freq: Once | INTRAVENOUS | Status: AC
  Administered 2022-07-29: 1250 mg via INTRAVENOUS
  Filled 2022-07-29: qty 250

## 2022-07-29 MED ORDER — SERTRALINE HCL 50 MG PO TABS
50.0000 mg | ORAL_TABLET | Freq: Every day | ORAL | Status: DC
  Administered 2022-07-29 – 2022-08-01 (×4): 50 mg via ORAL
  Filled 2022-07-29 (×4): qty 1

## 2022-07-29 MED ORDER — SODIUM CHLORIDE 0.9 % IV SOLN: INTRAVENOUS | Status: DC

## 2022-07-29 MED ORDER — RISAQUAD PO CAPS
1.0000 | ORAL_CAPSULE | Freq: Every day | ORAL | Status: DC
  Administered 2022-07-29 – 2022-08-01 (×4): 1 via ORAL
  Filled 2022-07-29 (×4): qty 1

## 2022-07-29 MED ORDER — VANCOMYCIN HCL IN DEXTROSE 1-5 GM/200ML-% IV SOLN: 1000.0000 mg | INTRAVENOUS | Status: DC

## 2022-07-29 MED ORDER — POLYETHYLENE GLYCOL 3350 17 G PO PACK: 17.0000 g | PACK | Freq: Every day | ORAL | Status: DC | PRN

## 2022-07-29 MED ORDER — CALCIUM CARBONATE ANTACID 500 MG PO CHEW: 1.0000 | CHEWABLE_TABLET | Freq: Every day | ORAL | Status: DC | PRN

## 2022-07-29 MED ORDER — MIRTAZAPINE 15 MG PO TABS
15.0000 mg | ORAL_TABLET | Freq: Every day | ORAL | Status: DC
  Administered 2022-07-29 – 2022-07-31 (×3): 15 mg via ORAL
  Filled 2022-07-29 (×3): qty 1

## 2022-07-29 MED ORDER — MIDODRINE HCL 5 MG PO TABS
10.0000 mg | ORAL_TABLET | Freq: Three times a day (TID) | ORAL | Status: DC
  Administered 2022-07-29 – 2022-08-01 (×9): 10 mg via ORAL
  Filled 2022-07-29 (×9): qty 2

## 2022-07-29 MED ORDER — SODIUM CHLORIDE 0.9 % IV SOLN
500.0000 mg | INTRAVENOUS | Status: DC
  Administered 2022-07-29: 500 mg via INTRAVENOUS
  Filled 2022-07-29: qty 5

## 2022-07-29 MED ORDER — BISACODYL 10 MG RE SUPP: 10.0000 mg | Freq: Every day | RECTAL | Status: DC | PRN

## 2022-07-29 NOTE — Progress Notes (Signed)
Pharmacy Antibiotic Note  Robyn Barton is a 77 y.o. female admitted on 07/29/2022 with pneumonia.  Pharmacy has been consulted for vancomycin and cefepime dosing.  Renal function - Scr 1.41 (baseline scr 1.3)  Plan: Cefepime 2 grams every 24 hours  Vancomycin 1250 mg x 1  Vancomycin 1,000 mg every 48 hours  Follow up renal function and cultures.   Height: 5\' 4"  (162.6 cm) Weight: 50.4 kg (111 lb 3.2 oz) IBW/kg (Calculated) : 54.7  Temp (24hrs), Avg:99.4 F (37.4 C), Min:99.4 F (37.4 C), Max:99.4 F (37.4 C)  Recent Labs  Lab 07/29/22 0656 07/29/22 0932  WBC 26.3*  --   CREATININE 1.41*  --   LATICACIDVEN 2.7* 3.0*    Estimated Creatinine Clearance: 25.7 mL/min (A) (by C-G formula based on SCr of 1.41 mg/dL (H)).    No Known Allergies  Antimicrobials this admission: vancomycin 7/15 >>  cefepime 7/15 >>   Dose adjustments this admission: N/a  Microbiology results: 7/15 BCx: in process 7/15 UCx: in process  7/15 Sputum: to be collected  7/15 MRSA PCR: ordered  Thank you for allowing pharmacy to be a part of this patient's care.  Elliot Gurney, PharmD, BCPS Clinical Pharmacist  07/29/2022 10:54 AM

## 2022-07-29 NOTE — Consult Note (Signed)
CODE SEPSIS - PHARMACY COMMUNICATION  **Broad Spectrum Antibiotics should be administered within 1 hour of Sepsis diagnosis**  Time Code Sepsis Called/Page Received: 9629  Antibiotics Ordered: ceftriaxone and azithromycin  Time of 1st antibiotic administration: 0818  Additional action taken by pharmacy: Messaged RN had to wait for IV team to gain access  Barrie Folk ,PharmD Clinical Pharmacist  07/29/2022  7:12 AM

## 2022-07-29 NOTE — ED Notes (Signed)
 Lab contacted to send phlebotomist

## 2022-07-29 NOTE — ED Triage Notes (Signed)
Patient to arrive via EMS from [facility]. Per EMS, the staff was doing rounds and noticed that the patient had an increase work of breathing and called EMS. Patient currently being treated for UTI.   Upon EMS arrival patient was 73% on 3L [place] and 78% on non rebreather. Patient placed on CPAP in field. EMS placed cpap in field with oxygen rising to 89%

## 2022-07-29 NOTE — Sepsis Progress Note (Signed)
Pt is difficult stick

## 2022-07-29 NOTE — ED Provider Notes (Signed)
-----------------------------------------   8:23 AM on 07/29/2022 -----------------------------------------   I took over care of this patient from Dr. Scotty Court.  The patient reports that her breathing is better on the BiPAP.  She is maintaining an O2 saturation of 100% although is still tachypneic to the 30s.  Temperature is borderline elevated.  Blood pressure has gone down to the 80s systolic.   .Critical Care  Performed by: Dionne Bucy, MD Authorized by: Dionne Bucy, MD   Critical care provider statement:    Critical care time (minutes):  30   Critical care time was exclusive of:  Separately billable procedures and treating other patients   Critical care was necessary to treat or prevent imminent or life-threatening deterioration of the following conditions:  Respiratory failure and sepsis   Critical care was time spent personally by me on the following activities:  Development of treatment plan with patient or surrogate, discussions with consultants, evaluation of patient's response to treatment, examination of patient, ordering and review of laboratory studies, ordering and review of radiographic studies, ordering and performing treatments and interventions, pulse oximetry, re-evaluation of patient's condition and review of old charts   ----------------------------------------- 9:49 AM on 07/29/2022 -----------------------------------------  Lab workup is notable for WBC count of 26, elevated lactate, and negative respiratory panel.  The patient's blood pressure has stabilized.  She is receiving antibiotics.  Respiratory rate is coming down.  I consulted Dr. Clyde Lundborg from the hospitalist service; based on our discussion he agrees to evaluate the patient for admission.   Dionne Bucy, MD 07/29/22 [phone]

## 2022-07-29 NOTE — ED Provider Notes (Signed)
[facility] Provider Note    None    (approximate)   History   Chief Complaint: Shortness of breath  HPI  Robyn Barton is a 77 y.o. female with a history of lung cancer, COPD, CKD who was brought to the ED due to shortness of breath.  Patient was found by nursing home staff this morning in respiratory distress.  Initial oxygen saturation was 76% on 3 L.  Patient does not have chronic oxygen therapy at home.  Denies pain.     Physical Exam   Triage Vital Signs: ED Triage Vitals  Encounter Vitals Group     BP      Systolic BP Percentile      Diastolic BP Percentile      Pulse      Resp      Temp      Temp src      SpO2      Weight      Height      Head Circumference      Peak Flow      Pain Score      Pain Loc      Pain Education      Exclude from Growth Chart     Most recent vital signs: Vitals:   07/29/22 0652 07/29/22 0653  BP: 123/73 123/73  Pulse: (!) 106 (!) 104  Resp: (!) 34 (!) 36  SpO2: 99% 100%    General: Awake, moderate respiratory distress CV:  Good peripheral perfusion.  Tachycardia heart rate 100 Resp:  Tachypnea, accessory muscle use.  Respiratory rate of 30.  Symmetric air movement with coarse breath sounds diffusely. Abd:  No distention.  Soft nontender Other:  No lower extremity edema or calf tenderness   ED Results / Procedures / Treatments   Labs (all labs ordered are listed, but only abnormal results are displayed) Labs Reviewed  RESP PANEL BY RT-PCR (RSV, FLU A&B, COVID)  RVPGX2  CULTURE, BLOOD (ROUTINE X 2)  CULTURE, BLOOD (ROUTINE X 2)  SARS CORONAVIRUS 2 BY RT PCR  LACTIC ACID, PLASMA  LACTIC ACID, PLASMA  COMPREHENSIVE METABOLIC PANEL  CBC WITH DIFFERENTIAL/PLATELET  PROTIME-INR  APTT  URINALYSIS, W/ REFLEX TO CULTURE (INFECTION SUSPECTED)  BLOOD GAS, VENOUS     EKG Interpreted by me Sinus tachycardia rate 103.  Normal axis, normal intervals.  Normal QRS ST segments and T  waves.   RADIOLOGY Chest x-ray interpreted by me, shows moderate right-sided pleural effusion.  Left lung is clear.  Radiology report reviewed   PROCEDURES:  .Critical Care  Performed by: Sharman Cheek, MD Authorized by: Sharman Cheek, MD   Critical care provider statement:    Critical care time (minutes):  35   Critical care time was exclusive of:  Separately billable procedures and treating other patients   Critical care was necessary to treat or prevent imminent or life-threatening deterioration of the following conditions:  Sepsis and respiratory failure   Critical care was time spent personally by me on the following activities:  Development of treatment plan with patient or surrogate, discussions with consultants, evaluation of patient's response to treatment, examination of patient, obtaining history from patient or surrogate, ordering and performing treatments and interventions, ordering and review of laboratory studies, ordering and review of radiographic studies, pulse oximetry, re-evaluation of patient's condition and review of old charts    MEDICATIONS ORDERED IN ED: Medications  cefTRIAXone (ROCEPHIN) 2 g in sodium chloride 0.9 % 100 mL  IVPB (has no administration in time range)  azithromycin (ZITHROMAX) 500 mg in sodium chloride 0.9 % 250 mL IVPB (has no administration in time range)     IMPRESSION / MDM / ASSESSMENT AND PLAN / ED COURSE  I reviewed the triage vital signs and the nursing notes.  DDx: Pneumonia, pleural effusion, pulmonary edema, non-STEMI, pulmonary embolism, sepsis, AKI, electrolyte abnormality  Patient's presentation is most consistent with acute presentation with potential threat to life or bodily function.  Patient presents with respiratory distress, placed on BiPAP on arrival.  She is a full code.  Will initiate sepsis workup, give empiric Rocephin and azithromycin.  Patient will need to be hospitalized for further management of hypoxic  respiratory failure       FINAL CLINICAL IMPRESSION(S) / ED DIAGNOSES   Final diagnoses:  COPD exacerbation (HCC)  Acute respiratory failure with hypoxia (HCC)     Rx / DC Orders   ED Discharge Orders     None        Note:  This document was prepared using Dragon voice recognition software and may include unintentional dictation errors.   Sharman Cheek, MD 07/29/22 [phone]

## 2022-07-29 NOTE — Progress Notes (Signed)
PHARMACIST - PHYSICIAN COMMUNICATION  CONCERNING:  Enoxaparin (Lovenox) for DVT Prophylaxis    RECOMMENDATION: Patient was prescribed enoxaprin 40mg  q24 hours for VTE prophylaxis.   Filed Weights   07/29/22 0653  Weight: 50.4 kg (111 lb 3.2 oz)    Body mass index is 19.09 kg/m.  Estimated Creatinine Clearance: 25.7 mL/min (A) (by C-G formula based on SCr of 1.41 mg/dL (H)).  Patient is candidate for enoxaparin 30mg  every 24 hours based on CrCl <34ml/min or Weight <45kg  DESCRIPTION: Pharmacy has adjusted enoxaparin dose per [facility] policy.  Patient is now receiving enoxaparin 30 mg every 24 hours    Barrie Folk, PharmD Clinical Pharmacist  07/29/2022 10:04 AM

## 2022-07-29 NOTE — Sepsis Progress Note (Signed)
Elink monitoring for the code sepsis protocol.  

## 2022-07-29 NOTE — Sepsis Progress Note (Signed)
Notified bedside nurse of need to draw repeat lactic acid. 

## 2022-07-29 NOTE — Sepsis Progress Note (Signed)
Notified provider and bedside nurse of need to order and draw repeat lactic acid #3.  

## 2022-07-29 NOTE — H&P (Signed)
History and Physical    Robyn Barton JYN:[id number] DOB: 1945-06-20 DOA: 07/29/2022  Referring MD/NP/PA:   PCP: Melvenia Beam, MD   Patient coming from:  The patient is coming from SNF   Chief Complaint: Shortness of breath  HPI: Robyn Barton is a 77 y.o. 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  Pt is brought to ED from nursing home, due to shortness of breath with respiratory distress. Pt is not using oxygen at baseline, found to have oxygen desaturation to 73% on 3L [place] and 78% on non rebreather. Patient was placed on CPAP in field by EMS, with oxygen rising to 89%, but still has severe respiratory distress at arrival to ED, using accessory muscle for breathing, BiPAP is started.   When saw patient in ED, patient looks very fragile and very tired, but is orientated x 3.  She moves all extremities.  She has dry cough, denies chest pain, fever or chills.  No nausea, vomiting, diarrhea or abdominal pain.  No symptoms of UTI.  Patient is currently being treated for UTI with Macrobid since 7/13.  Data reviewed independently and ED Course: pt was found to have WBC 26.3, negative PCR for COVID, flu and RSV, positive urinalysis (turbid appearance, moderate amount leukocyte, many bacteria, WBC> 50), BNP 2190, stable renal function, temperature 99.4, initial blood pressure 223/73, which decreased to 87/59 with a MAP 69 after started BiPAP, then improved to 109/70 with IV fluid resuscitation.  X-ray of pelvis negative.  Patient is admitted to PCU as inpatient.  Chest x-ray: 1. Increased opacity in the lower third of the right chest which appears consistent with at least a small or possibly moderate size pleural effusion. Possibility of overlying consolidation and recurrent tumor is considered. 2. COPD and chronic  paramediastinal fibrosis.   EKG: I have personally reviewed.  Sinus rhythm, QTc 473, early R wave progression, left atrial enlargement, T wave inversion in inferior leads and V2-V6.  Review of Systems:   General: no fevers, chills, no body weight gain, has fatigue HEENT: no blurry vision, hearing changes or sore throat Respiratory: has dyspnea, coughing, wheezing CV: no chest pain, no palpitations GI: no nausea, vomiting, abdominal pain, diarrhea, constipation GU: no dysuria, burning on urination, increased urinary frequency, hematuria  Ext: no leg edema Neuro: no unilateral weakness, numbness, or tingling, no vision change or hearing loss Skin: no rash, no skin tear. MSK: No muscle spasm, no deformity, no limitation of range of movement in spin Heme: No easy bruising.  Travel history: No recent long distant travel.   Allergy: No Known Allergies  Past Medical History:  Diagnosis Date   Allergic rhinitis    Anxiety    Elevated TSH    pt denies   Emphysema lung (HCC)    Enlarged lymph nodes    in chest   GERD (gastroesophageal reflux disease)    Goals of care, counseling/discussion 10/21/2016   History of hiatal hernia    History of radiation therapy 03/10/17- 03/21/17   Whole brain, prophylactic, radiation 25 Gy in 10 fractions   History of radiation therapy 11/18/16- 01/16/17   59.4 Gy to lung in 33 fractions.   HTN (hypertension) 07/29/2022   Hyperlipidemia    IFG (impaired fasting glucose)    Osteopenia    Recurrent major depressive disorder (HCC)  Sleep apnea    mild no cpap needed   Small cell carcinoma of right lung (HCC) 2018   surgery right middle lobe removed    Tobacco dependence    Varicose veins of both lower extremities    Vitamin D deficiency     Past Surgical History:  Procedure Laterality Date   ABDOMINAL HYSTERECTOMY     1 ovary left   ANTERIOR APPROACH HEMI HIP ARTHROPLASTY Left 08/12/2017   Procedure: ANTERIOR APPROACH HEMI HIP ARTHROPLASTY;   Surgeon: Tarry Kos, MD;  Location: [facility] OR;  Service: Orthopedics;  Laterality: Left;   ENDOBRONCHIAL ULTRASOUND Bilateral 10/15/2016   Procedure: ENDOBRONCHIAL ULTRASOUND;  Surgeon: Leslye Peer, MD;  Location: [facility] ENDOSCOPY;  Service: Cardiopulmonary;  Laterality: Bilateral;   INCONTINENCE SURGERY     bladder sling   right middle lobe lung surgery  06/26/2016   [facility]    Social History:  reports that she quit smoking about 6 years ago. Her smoking use included cigarettes. She started smoking about 56 years ago. She has a 75 pack-year smoking history. She has never used smokeless tobacco. She reports current alcohol use. She reports that she does not use drugs.  Family History:  Family History  Problem Relation Age of Onset   Depression Mother    Cancer Father    COPD Father    Heart disease Father    High blood pressure Father    High Cholesterol Father      Prior to Admission medications   Medication Sig Start Date End Date Taking? Authorizing Provider  albuterol (PROVENTIL HFA;VENTOLIN HFA) 108 (90 Base) MCG/ACT inhaler Inhale 2 puffs into the lungs every 6 (six) hours as needed for wheezing or shortness of breath. 04/23/17   Sharlene Dory, DO  calcium carbonate (TUMS - DOSED IN MG ELEMENTAL CALCIUM) 500 MG chewable tablet Chew 1 tablet daily as needed by mouth for indigestion or heartburn.    [provider]  cephALEXin (KEFLEX) 500 MG capsule Take 1 capsule (500 mg total) by mouth 2 (two) times daily. 12/11/21   Roxy Horseman, PA-C  dexamethasone (DECADRON) 2 MG tablet Take 1 tablet (2 mg total) by mouth daily. Patient not taking: Reported on 12/11/2021 12/09/17   Henreitta Leber, MD  Multiple Vitamin (MULTIVITAMIN) tablet Take 1 tablet by mouth daily.    [provider]  omeprazole (PRILOSEC) 40 MG capsule Take 40 mg by mouth in the morning and at bedtime. 08/01/16   [provider]  ondansetron (ZOFRAN-ODT) 4 MG disintegrating tablet  Take 1 tablet (4 mg total) by mouth every 8 (eight) hours as needed for nausea or vomiting. 12/11/21   Roxy Horseman, PA-C  potassium chloride SA (KLOR-CON M) 20 MEQ tablet Take 1 tablet (20 mEq total) by mouth 2 (two) times daily for 3 days. 09/22/21 09/25/21  Pollyann Savoy, MD  sertraline (ZOLOFT) 100 MG tablet TAKE 1 TABLET BY MOUTH  DAILY 03/23/18   Sharlene Dory, DO  sucralfate (CARAFATE) 1 g tablet Take 1 tablet (1 g total) by mouth 4 (four) times daily -  with meals and at bedtime. 12/11/21   Roxy Horseman, PA-C  tiotropium (SPIRIVA HANDIHALER) 18 MCG inhalation capsule Place 1 capsule (18 mcg total) into inhaler and inhale daily. Patient not taking: Reported on 12/11/2021 04/23/17   Sharlene Dory, DO  TRELEGY ELLIPTA 100-62.5-25 MCG/ACT AEPB Inhale 1 puff into the lungs daily. 12/10/21   [provider]    Physical Exam: Vitals:  07/29/22 1400 07/29/22 1430 07/29/22 1434 07/29/22 1832  BP: (!) 121/93 (!) 102/47  (!) 116/94  Pulse: (!) 105 90 88 92  Resp: 19 (!) 30 (!) 28 (!) 24  Temp:    (!) 97.3 F (36.3 C)  TempSrc:    Axillary  SpO2: 98% 100% 100% 100%  Weight:      Height:       General: In no acute respiratory distress, cachectic looking HEENT:       Eyes: PERRL, EOMI, no jaundice       ENT: No discharge from the ears and nose, no pharynx injection, no tonsillar enlargement.        Neck: No JVD, no bruit, no mass felt. Heme: No neck lymph node enlargement. Cardiac: S1/S2, RRR, No murmurs, No gallops or rubs. Respiratory: Has wheezing bilaterally GI: Soft, nondistended, nontender, no rebound pain, no organomegaly, BS present. GU: No hematuria Ext: No pitting leg edema bilaterally. 1+DP/PT pulse bilaterally. Musculoskeletal: No joint deformities, No joint redness or warmth, no limitation of ROM in spin. Skin: No rashes.  Neuro: Lethargic, arousable, oriented X3, cranial nerves II-XII grossly intact, moves all extremities.  Psych:  Patient is not psychotic, no suicidal or hemocidal ideation.  Labs on Admission: I have personally reviewed following labs and imaging studies  CBC: Recent Labs  Lab 07/29/22 0656  WBC 26.3*  NEUTROABS 22.9*  HGB 11.4*  HCT 38.2  MCV 76.2*  PLT 370   Basic Metabolic Panel: Recent Labs  Lab 07/29/22 0656  NA 138  K 3.9  CL 106  CO2 20*  GLUCOSE 119*  BUN 52*  CREATININE 1.41*  CALCIUM 9.9   GFR: Estimated Creatinine Clearance: 25.7 mL/min (A) (by C-G formula based on SCr of 1.41 mg/dL (H)). Liver Function Tests: Recent Labs  Lab 07/29/22 0656  AST 23  ALT 13  ALKPHOS 105  BILITOT 0.5  PROT 9.1*  ALBUMIN 2.6*   No results for input(s): "LIPASE", "AMYLASE" in the last 168 hours. No results for input(s): "AMMONIA" in the last 168 hours. Coagulation Profile: Recent Labs  Lab 07/29/22 0656  INR 1.4*   Cardiac Enzymes: No results for input(s): "CKTOTAL", "CKMB", "CKMBINDEX", "TROPONINI" in the last 168 hours. BNP (last 3 results) No results for input(s): "PROBNP" in the last 8760 hours. HbA1C: No results for input(s): "HGBA1C" in the last 72 hours. CBG: No results for input(s): "GLUCAP" in the last 168 hours. Lipid Profile: No results for input(s): "CHOL", "HDL", "LDLCALC", "TRIG", "CHOLHDL", "LDLDIRECT" in the last 72 hours. Thyroid Function Tests: No results for input(s): "TSH", "T4TOTAL", "FREET4", "T3FREE", "THYROIDAB" in the last 72 hours. Anemia Panel: No results for input(s): "VITAMINB12", "FOLATE", "FERRITIN", "TIBC", "IRON", "RETICCTPCT" in the last 72 hours. Urine analysis:    Component Value Date/Time   COLORURINE YELLOW (A) 07/29/2022 0835   APPEARANCEUR TURBID (A) 07/29/2022 0835   LABSPEC 1.017 07/29/2022 0835   PHURINE 7.0 07/29/2022 0835   GLUCOSEU NEGATIVE 07/29/2022 0835   GLUCOSEU NEGATIVE 03/03/2017 1456   HGBUR SMALL (A) 07/29/2022 0835   BILIRUBINUR NEGATIVE 07/29/2022 0835   BILIRUBINUR negative 04/09/2017 1107   KETONESUR 5  (A) 07/29/2022 0835   PROTEINUR >=300 (A) 07/29/2022 0835   UROBILINOGEN 0.2 04/09/2017 1107   UROBILINOGEN 0.2 03/03/2017 1456   NITRITE NEGATIVE 07/29/2022 0835   LEUKOCYTESUR MODERATE (A) 07/29/2022 0835   Sepsis Labs: @LABRCNTIP (procalcitonin:4,lacticidven:4) ) Recent Results (from the past 240 hour(s))  Urine Culture (for pregnant, neutropenic or urologic patients or patients with an indwelling  urinary catheter)     Status: Abnormal   Collection Time: 07/27/22 10:16 AM   Specimen: Urine, Clean Catch  Result Value Ref Range Status   Specimen Description   Final    URINE, CLEAN CATCH Performed at [facility], [address], [place], [place] [address]    Special Requests   Final    NONE Performed at [facility], [address] [place], [place] [address]    Culture >=100,000 COLONIES/mL PROTEUS MIRABILIS (A)  Final   Report Status 07/29/2022 FINAL  Final   Organism ID, Bacteria PROTEUS MIRABILIS (A)  Final      Susceptibility   Proteus mirabilis - MIC*    AMPICILLIN <=2 SENSITIVE Sensitive     CEFAZOLIN <=4 SENSITIVE Sensitive     CEFEPIME <=0.12 SENSITIVE Sensitive     CEFTRIAXONE <=0.25 SENSITIVE Sensitive     CIPROFLOXACIN <=0.25 SENSITIVE Sensitive     GENTAMICIN <=1 SENSITIVE Sensitive     IMIPENEM 2 SENSITIVE Sensitive     NITROFURANTOIN 128 RESISTANT Resistant     TRIMETH/SULFA <=20 SENSITIVE Sensitive     AMPICILLIN/SULBACTAM <=2 SENSITIVE Sensitive     PIP/TAZO <=4 SENSITIVE Sensitive     * >=100,000 COLONIES/mL PROTEUS MIRABILIS  Resp panel by RT-PCR (RSV, Flu A&B, Covid) Anterior Nasal Swab     Status: None   Collection Time: 07/29/22  8:36 AM   Specimen: Anterior Nasal Swab  Result Value Ref Range Status   SARS Coronavirus 2 by RT PCR NEGATIVE NEGATIVE Final    Comment: (NOTE) SARS-CoV-2 target nucleic acids are NOT DETECTED.  The SARS-CoV-2 RNA is generally detectable in upper respiratory specimens during the acute phase of  infection. The lowest concentration of SARS-CoV-2 viral copies this assay can detect is 138 copies/mL. A negative result does not preclude SARS-Cov-2 infection and should not be used as the sole basis for treatment or other patient management decisions. A negative result may occur with  improper specimen collection/handling, submission of specimen other than nasopharyngeal swab, presence of viral mutation(s) within the areas targeted by this assay, and inadequate number of viral copies(<138 copies/mL). A negative result must be combined with clinical observations, patient history, and epidemiological information. The expected result is Negative.  Fact Sheet for Patients:  BloggerCourse.com  Fact Sheet for Healthcare Providers:  SeriousBroker.it  This test is no t yet approved or cleared by the [place] FDA and  has been authorized for detection and/or diagnosis of SARS-CoV-2 by FDA under an Emergency Use Authorization (EUA). This EUA will remain  in effect (meaning this test can be used) for the duration of the COVID-19 declaration under Section 564(b)(1) of the Act, 21 U.S.C.section 360bbb-3(b)(1), unless the authorization is terminated  or revoked sooner.       Influenza A by PCR NEGATIVE NEGATIVE Final   Influenza B by PCR NEGATIVE NEGATIVE Final    Comment: (NOTE) The Xpert Xpress SARS-CoV-2/FLU/RSV plus assay is intended as an aid in the diagnosis of influenza from Nasopharyngeal swab specimens and should not be used as a sole basis for treatment. Nasal washings and aspirates are unacceptable for Xpert Xpress SARS-CoV-2/FLU/RSV testing.  Fact Sheet for Patients: BloggerCourse.com  Fact Sheet for Healthcare Providers: SeriousBroker.it  This test is not yet approved or cleared by the [place] FDA and has been authorized for detection and/or diagnosis of SARS-CoV-2  by FDA under an Emergency Use Authorization (EUA). This EUA will remain in effect (meaning this test can be used) for the  duration of the COVID-19 declaration under Section 564(b)(1) of the Act, 21 U.S.C. section 360bbb-3(b)(1), unless the authorization is terminated or revoked.     Resp Syncytial Virus by PCR NEGATIVE NEGATIVE Final    Comment: (NOTE) Fact Sheet for Patients: BloggerCourse.com  Fact Sheet for Healthcare Providers: SeriousBroker.it  This test is not yet approved or cleared by the [place] FDA and has been authorized for detection and/or diagnosis of SARS-CoV-2 by FDA under an Emergency Use Authorization (EUA). This EUA will remain in effect (meaning this test can be used) for the duration of the COVID-19 declaration under Section 564(b)(1) of the Act, 21 U.S.C. section 360bbb-3(b)(1), unless the authorization is terminated or revoked.  Performed at [facility], [address] [place], [place] [address]      Radiological Exams on Admission: DG Pelvis Portable  Result Date: 07/29/2022 CLINICAL DATA:  Leg appears shortened EXAM: PORTABLE PELVIS 1-2 VIEWS COMPARISON:  Pelvis radiographs 10/10/2018 FINDINGS: Postsurgical changes reflecting bilateral hip arthroplasty are noted. Hardware alignment is within expected limits, without evidence of hardware related complication. There is no perihardware fracture. The SI joints and symphysis pubis are intact. The soft tissues are unremarkable. IMPRESSION: Status post bilateral hip arthroplasty without evidence of complication. No acute finding. Electronically Signed   By: Lesia Hausen M.D.   On: 07/29/2022 09:12   DG Chest Port 1 View  Result Date: 07/29/2022 CLINICAL DATA:  Questionable sepsis. EXAM: PORTABLE CHEST 1 VIEW COMPARISON:  AP Lat chest 12/11/2021, CT chest no contrast 05/30/2021. FINDINGS: Volume loss is again noted from old partial right  pneumonectomy, with ipsilateral mediastinal shift and chronic paramediastinal fibrosis. There is increased opacity in the lower third of the right chest which appears consistent with at least a small or possibly moderate size pleural effusion. Possibility of overlying consolidation and recurrent tumor is considered. The lungs are otherwise generally clear with COPD change. The cardiac size is normal, shifted to the right chest as before. No vascular congestion is seen. The mediastinal configuration appears stable. There is mild thoracic levoscoliosis, osteopenia and degenerative change. No new osseous abnormality. IMPRESSION: 1. Increased opacity in the lower third of the right chest which appears consistent with at least a small or possibly moderate size pleural effusion. Possibility of overlying consolidation and recurrent tumor is considered. 2. COPD and chronic paramediastinal fibrosis. Electronically Signed   By: Almira Bar M.D.   On: 07/29/2022 07:27      Assessment/Plan Principal Problem:   Acute respiratory failure with hypoxia and hypercapnia (HCC) Active Problems:   HCAP (healthcare-associated pneumonia)   COPD exacerbation (HCC)   UTI (urinary tract infection)   Severe sepsis (HCC)   Chronic kidney disease, stage 3a (HCC)   HTN (hypertension)   Depression with anxiety   Protein-calorie malnutrition, moderate (HCC)   Assessment and Plan:  Acute respiratory failure with hypoxia and hypercapnia due to HCAP (healthcare-associated pneumonia) and COPD exacerbation (HCC): Patient has elevated BNP 2190, but patient does not have leg edema or JVD.  Clinically does not seem to have acute CHF.  - Will admit to PCU as inpt - IV Vancomycin and cefepime (pt received one dose of IV Rocephin and azithromycin in ED) - Incentive spirometry - Solu-Medrol 40 mg twice daily - Mucinex for cough  - Bronchodilators - Urine legionella and S. pneumococcal antigen - Follow up blood culture x2,  sputum culture  UTI (urinary tract infection): -on vancomycin and cefepime as above -Labs urine culture  Severe sepsis due to  HCAP, COPD exacerbation and UTI: Patient has severe sepsis with WBC 26.3, heart rate 106, RR 34.  Lactic acid is up to 2.7  --> 3.0.  Blood pressure is soft. -Patient is on broad antibiotics as above - will get Procalcitonin and trend lactic acid level per sepsis protocol - IVF: Total of 1.5 L IV fluid bolus, followed by 75 mL per hour of NS -Give midodrine 10 mg 3 times daily -albumin 12.5 g  Chronic kidney disease, stage 3a (HCC): Stable -Follow-up with BMP  HTN (hypertension) -Hold blood pressure medications due to soft blood pressure  Depression with anxiety -Continue home medications  Protein-calorie malnutrition, moderate (HCC): Body weight 50.4 kg, BMI 19.09 -Ensure -Nutrition consult       DVT ppx:  SQ Lovenox  Code Status: DNR per her sister who is POA  Family Communication: Yes, patient's sister and daughter by phone  Disposition Plan:  Anticipate discharge back to previous environment  Consults called:  none  Admission status and Level of care: Progressive:    as inpt    Dispo: The patient is from: SNF              Anticipated d/c is to: SNF              Anticipated d/c date is: 2 days              Patient currently is not medically stable to d/c.    Severity of Illness:  The appropriate patient status for this patient is INPATIENT. Inpatient status is judged to be reasonable and necessary in order to provide the required intensity of service to ensure the patient's safety. The patient's presenting symptoms, physical exam findings, and initial radiographic and laboratory data in the context of their chronic comorbidities is felt to place them at high risk for further clinical deterioration. Furthermore, it is not anticipated that the patient will be medically stable for discharge from the hospital within 2 midnights of admission.    * I certify that at the point of admission it is my clinical judgment that the patient will require inpatient hospital care spanning beyond 2 midnights from the point of admission due to high intensity of service, high risk for further deterioration and high frequency of surveillance required.*       Date of Service 07/29/2022    Lorretta Harp [facility]   If 7PM-7AM, please contact night-coverage [email or web address] 07/29/2022, 7:04 PM

## 2022-07-29 NOTE — ED Notes (Signed)
IV team at bedside 

## 2022-07-30 ENCOUNTER — Inpatient Hospital Stay: Payer: Medicare PPO

## 2022-07-30 DIAGNOSIS — J9602 Acute respiratory failure with hypercapnia: Secondary | ICD-10-CM | POA: Diagnosis not present

## 2022-07-30 DIAGNOSIS — R7881 Bacteremia: Secondary | ICD-10-CM | POA: Diagnosis present

## 2022-07-30 DIAGNOSIS — E43 Unspecified severe protein-calorie malnutrition: Secondary | ICD-10-CM | POA: Diagnosis present

## 2022-07-30 DIAGNOSIS — L8990 Pressure ulcer of unspecified site, unspecified stage: Secondary | ICD-10-CM | POA: Diagnosis present

## 2022-07-30 DIAGNOSIS — J9601 Acute respiratory failure with hypoxia: Secondary | ICD-10-CM | POA: Diagnosis not present

## 2022-07-30 DIAGNOSIS — D509 Iron deficiency anemia, unspecified: Secondary | ICD-10-CM | POA: Diagnosis present

## 2022-07-30 DIAGNOSIS — E876 Hypokalemia: Secondary | ICD-10-CM | POA: Diagnosis not present

## 2022-07-30 DIAGNOSIS — R7989 Other specified abnormal findings of blood chemistry: Secondary | ICD-10-CM | POA: Diagnosis present

## 2022-07-30 LAB — BLOOD CULTURE ID PANEL (REFLEXED) - BCID2

## 2022-07-30 LAB — URINE CULTURE

## 2022-07-30 LAB — BASIC METABOLIC PANEL
Anion gap: 16 — ABNORMAL HIGH (ref 5–15)
BUN: 53 mg/dL — ABNORMAL HIGH (ref 8–23)
CO2: 18 mmol/L — ABNORMAL LOW (ref 22–32)
Calcium: 8.9 mg/dL (ref 8.9–10.3)
Chloride: 107 mmol/L (ref 98–111)
Creatinine, Ser: 1.29 mg/dL — ABNORMAL HIGH (ref 0.44–1.00)
GFR, Estimated: 42 mL/min — ABNORMAL LOW (ref >60)
Glucose, Bld: 133 mg/dL — ABNORMAL HIGH (ref 70–99)
Potassium: 3.3 mmol/L — ABNORMAL LOW (ref 3.5–5.1)
Sodium: 141 mmol/L (ref 135–145)

## 2022-07-30 LAB — CBC
HCT: 28.9 % — ABNORMAL LOW (ref 36.0–46.0)
Hemoglobin: 8.6 g/dL — ABNORMAL LOW (ref 12.0–15.0)
MCH: 22.4 pg — ABNORMAL LOW (ref 26.0–34.0)
MCHC: 29.8 g/dL — ABNORMAL LOW (ref 30.0–36.0)
MCV: 75.3 fL — ABNORMAL LOW (ref 80.0–100.0)
Platelets: 216 10*3/uL (ref 150–400)
RBC: 3.84 MIL/uL — ABNORMAL LOW (ref 3.87–5.11)
RDW: 18.7 % — ABNORMAL HIGH (ref 11.5–15.5)
WBC: 17.9 10*3/uL — ABNORMAL HIGH (ref 4.0–10.5)
nRBC: 0 % (ref 0.0–0.2)

## 2022-07-30 LAB — HEMOGLOBIN AND HEMATOCRIT, BLOOD
HCT: 26.9 % — ABNORMAL LOW (ref 36.0–46.0)
Hemoglobin: 8.2 g/dL — ABNORMAL LOW (ref 12.0–15.0)

## 2022-07-30 LAB — LACTIC ACID, PLASMA
Lactic Acid, Venous: 1 mmol/L (ref 0.5–1.9)
Lactic Acid, Venous: 1.3 mmol/L (ref 0.5–1.9)
Lactic Acid, Venous: 2.6 mmol/L (ref 0.5–1.9)

## 2022-07-30 LAB — STREP PNEUMONIAE URINARY ANTIGEN: Strep Pneumo Urinary Antigen: NEGATIVE

## 2022-07-30 MED ORDER — SODIUM CHLORIDE 0.9 % IV SOLN
2.0000 g | INTRAVENOUS | Status: DC
  Administered 2022-07-30 – 2022-08-01 (×3): 2 g via INTRAVENOUS
  Filled 2022-07-30 (×3): qty 20

## 2022-07-30 MED ORDER — VITAMIN C 500 MG PO TABS
500.0000 mg | ORAL_TABLET | Freq: Two times a day (BID) | ORAL | Status: DC
  Administered 2022-07-30 – 2022-08-01 (×4): 500 mg via ORAL
  Filled 2022-07-30 (×4): qty 1

## 2022-07-30 MED ORDER — ENSURE ENLIVE PO LIQD
237.0000 mL | Freq: Three times a day (TID) | ORAL | Status: DC
  Administered 2022-07-30 – 2022-08-01 (×6): 237 mL via ORAL

## 2022-07-30 MED ORDER — FUROSEMIDE 10 MG/ML IJ SOLN
20.0000 mg | Freq: Every day | INTRAMUSCULAR | Status: DC
  Administered 2022-07-30 – 2022-08-01 (×3): 20 mg via INTRAVENOUS
  Filled 2022-07-30 (×3): qty 2

## 2022-07-30 MED ORDER — ZINC SULFATE 220 (50 ZN) MG PO CAPS
220.0000 mg | ORAL_CAPSULE | Freq: Every day | ORAL | Status: DC
  Administered 2022-07-30 – 2022-08-01 (×3): 220 mg via ORAL
  Filled 2022-07-30 (×3): qty 1

## 2022-07-30 MED ORDER — ADULT MULTIVITAMIN W/MINERALS CH
1.0000 | ORAL_TABLET | Freq: Every day | ORAL | Status: DC
  Administered 2022-07-30 – 2022-08-01 (×3): 1 via ORAL
  Filled 2022-07-30 (×3): qty 1

## 2022-07-30 MED ORDER — PROSOURCE PLUS PO LIQD
30.0000 mL | Freq: Three times a day (TID) | ORAL | Status: DC
  Administered 2022-07-30 – 2022-08-01 (×6): 30 mL via ORAL
  Filled 2022-07-30 (×8): qty 30

## 2022-07-30 MED ORDER — HALOPERIDOL LACTATE 5 MG/ML IJ SOLN
1.0000 mg | Freq: Four times a day (QID) | INTRAMUSCULAR | Status: DC | PRN
  Administered 2022-07-30: 1 mg via INTRAMUSCULAR
  Filled 2022-07-30: qty 1

## 2022-07-30 NOTE — Assessment & Plan Note (Signed)
Had been started on Macrobid 7/13. Urine culture here grew multiple species, but blood cultures growing Proteus sp, a typical Uti organism. --Continue IV Rocephin --Follow blood culture for susceptibilities

## 2022-07-30 NOTE — Assessment & Plan Note (Signed)
K 3.3 this AM, replacement ordered. --Monitor BMP, check Mg level

## 2022-07-30 NOTE — Plan of Care (Signed)
Per family visit via face-time: Dr. Ancil Linsey noted that the patient was leaning to the RT side in the bed.  He was concerned about the patient having a current stroke and attempted to "demand" for me to call a code stroke for the patient. I explained that Id be happy to voice concerns to the Hospitalist, but I did not have an ACUTE change  - allowing me to call a code stroke.   Patient's son was also at bedside and confirmed, the right side "lean" was not new for this patient and he clearly remembers the patient in this same position - yesterday at admission in the ED. When  questioning the Dr. further,  it's been 4 months since he'd seen the patient, giving him little  ability to assess for acute changes.    My assessments throughout the day have not revealed any acute changes nor concerns for possible stroke.  When repositioning is attempted, patient is able to reposition self and usually end up "leaning to the right" again - Her preferred position." Patient lives in a facility and receives total care as needed. NO code Stroke  - needed to be called.  Hosp informed about family concerns and ordered CT to assess. Chrg and Administrator, arts informed of situation.

## 2022-07-30 NOTE — Plan of Care (Signed)
Patient has requested to speak with son multiple times today.  Each time we attempt to call, unfortunately there's NO answer and VM is full (unable to receive messages.).  Informed patient of the situation, but continues to be frustrated that we wont call her son.

## 2022-07-30 NOTE — Assessment & Plan Note (Signed)
BNP was 2190 on admission.  With Right pleural effusion on unclear etiology.   No prior echo available. --Check 2D Echo --Thoracentesis of pleural effusion & fluid studies - pending --Starting IV Lasix 20 mg daily --Daily weights & strict I/O's --Monitor renal function and electrolytes

## 2022-07-30 NOTE — Assessment & Plan Note (Signed)
Initial blood cultures growing Proteus spp. Suspect source is likely UTI.  Pt has been on Macrobid --Change antibiotics to Rocephin 2 g IV daily --Repeat blood cultures after 48 hrs --Monitor fever curve, CBC, hemodynamics

## 2022-07-30 NOTE — Assessment & Plan Note (Addendum)
Body mass index is 17.97 kg/m. Dietitian consult, recommendations appreciated Ensures

## 2022-07-30 NOTE — Progress Notes (Signed)
Pt's daughter, Ms. Dawn, called and asked for an update. I told her that we are awaiting result for the CT scan of the head and that pt is confused. She expressed understanding and also wants to reinforce that her mom is a DNR and she included that they give permission for a high dose of Morphine for respiratory distress. Ms. Alvis Lemmings verbalized "if she feels like she can't breath". Latest VS are stable. Pt remains confused and restless. Dr. Arville Care made aware via secure chat.  [phone]: CT scan of the head results are in. As per Dr. Arville Care, I updated the pt's daughter Ms. Dawn that there in no acute stroke or bleeding. I also informed Ms. Dawn that there is a PRN Haldol ordered for her mom that may help for agitation. She was not comfortable with the Haldol and would want to talk to her brother and uncle first. Ms. Alvis Lemmings also got the chance to talk to the pt.  2300H: Ms. Alvis Lemmings called back and said yes to giving Haldol.

## 2022-07-30 NOTE — Assessment & Plan Note (Signed)
Renal function stable. Monitor BMP. 

## 2022-07-30 NOTE — Evaluation (Signed)
Physical Therapy Evaluation Patient Details Name: Robyn Barton MRN: [id number] DOB: Oct 08, 1945 Today's Date: 07/30/2022  History of Present Illness  Pt is a 77 y.o. female presenting to hospital 07/29/22 with c/o SOB/respiratory distress.  Pt admitted with acute respiratory failure with hypoxia and hypercapnia d/t HCAP and COPD exacerbation, UTI, and severe sepsis.  PMH includes small cell lung CA (s/p surgery and radiation therapy 2018), COPD (no home O2), CKD, htn, depression with anxiety, L THA.  Clinical Impression  Prior to hospital admission, pt was at [facility] (since January per family report).  Therapist called facility to attempt to get PLOF information but no one answered when therapist was transferred to staff.  Pt's son present and reports pt is non-ambulatory but transfers with assist (son does not appear to see much of pt's functional mobility though).  Pt's son reports pt has not been successful with physical therapy in the past d/t pt's level of participation (pt was agreeable to working with therapist when asked during session but appearing anxious).  Currently pt is 1-2 assist with bed mobility and mod assist for sitting balance d/t posterior lean.  Pt appearing verbose and anxious during session.  Pt would currently benefit from skilled PT to address noted impairments and functional limitations during hospitalization (see below for any additional details).    Assistance Recommended at Discharge Frequent or constant Supervision/Assistance  If plan is discharge home, recommend the following:  Can travel by private vehicle  Two people to help with walking and/or transfers;Two people to help with bathing/dressing/bathroom;Assistance with feeding;Direct supervision/assist for medications management;Direct supervision/assist for financial management;Assist for transportation;Help with stairs or ramp for entrance   No    Equipment Recommendations Wheelchair (measurements  PT);Wheelchair cushion (measurements PT);Hospital bed;BSC/3in1;Other (comment) (hoyer lift)  Recommendations for Other Services       Functional Status Assessment Patient has had a recent decline in their functional status and/or demonstrates limited ability to make significant improvements in function in a reasonable and predictable amount of time     Precautions / Restrictions Precautions Precautions: Fall Restrictions Weight Bearing Restrictions: No      Mobility  Bed Mobility Overal bed mobility: Needs Assistance Bed Mobility: Supine to Sit, Sit to Supine, Rolling Rolling: Max assist (L/R in bed to change sheets d/t bed sheets wet)   Supine to sit: Mod assist, +2 for physical assistance, HOB elevated (pt sat up with max assist x1 but pt pushing backwards with trunk so pt assisted with laying back down for safety; pt able to sit up again with mod assist x2) Sit to supine: Min assist, Mod assist, +2 for physical assistance, HOB elevated   General bed mobility comments: assist for trunk and B LE's; vc's for technique    Transfers                   General transfer comment: Deferred d/t requiring assist for sitting balance    Ambulation/Gait                  Stairs            Wheelchair Mobility     Tilt Bed    Modified Rankin (Stroke Patients Only)       Balance Overall balance assessment: Needs assistance Sitting-balance support: Bilateral upper extremity supported, Feet supported Sitting balance-Leahy Scale: Poor Sitting balance - Comments: mod assist for sitting balance d/t posterior lean  Pertinent Vitals/Pain Pain Assessment Pain Assessment: 0-10 Pain Score: 2  Pain Location: pt did not state location when asked Pain Descriptors / Indicators: Discomfort Pain Intervention(s): Limited activity within patient's tolerance, Monitored during session, Repositioned Vitals (HR and SpO2 on 2  L via nasal cannula) stable and WFL throughout treatment session.    Home Living Family/patient expects to be discharged to:: Skilled nursing facility                   Additional Comments: [facility] since January 2024    Prior Function Prior Level of Function : Needs assist             Mobility Comments: Per pt's son pt non-ambulatory; transfers with assist       Hand Dominance        Extremity/Trunk Assessment   Upper Extremity Assessment Upper Extremity Assessment: Difficult to assess due to impaired cognition;Generalized weakness (decreased L hand grip strength compared to R)    Lower Extremity Assessment Lower Extremity Assessment: Difficult to assess due to impaired cognition;Generalized weakness (B DF to neutral ROM)    Cervical / Trunk Assessment Cervical / Trunk Assessment: Kyphotic  Communication   Communication: No difficulties  Cognition Arousal/Alertness: Awake/alert Behavior During Therapy: Anxious                                   General Comments: Pt oriented to person, place, year (not month/day).  Pt did not know situation.  Pt appearing verbose and anxious.  Pt with difficulty following 1 step cues.        General Comments General comments (skin integrity, edema, etc.): L forearm/hand appearing swollen compared to R (nurse notified and came to assess).  Nursing cleared pt for participation in physical therapy.  Pt agreeable to PT session.  Pt's son and grandchildren present.    Exercises     Assessment/Plan    PT Assessment Patient needs continued PT services  PT Problem List Decreased strength;Decreased activity tolerance;Decreased balance;Decreased mobility;Decreased cognition;Decreased knowledge of use of DME;Decreased safety awareness;Decreased knowledge of precautions       PT Treatment Interventions DME instruction;Functional mobility training;Therapeutic activities;Therapeutic exercise;Balance  training;Patient/family education    PT Goals (Current goals can be found in the Care Plan section)  Acute Rehab PT Goals Patient Stated Goal: to improve mobility PT Goal Formulation: With patient Time For Goal Achievement: 08/13/22 Potential to Achieve Goals: Fair    Frequency Min 1X/week     Co-evaluation               AM-PAC PT "6 Clicks" Mobility  Outcome Measure Help needed turning from your back to your side while in a flat bed without using bedrails?: A Lot Help needed moving from lying on your back to sitting on the side of a flat bed without using bedrails?: A Lot Help needed moving to and from a bed to a chair (including a wheelchair)?: Total Help needed standing up from a chair using your arms (e.g., wheelchair or bedside chair)?: Total Help needed to walk in hospital room?: Total Help needed climbing 3-5 steps with a railing? : Total 6 Click Score: 8    End of Session Equipment Utilized During Treatment: Oxygen (2 L via nasal cannula) Activity Tolerance: Patient tolerated treatment well Patient left: in bed;with call bell/phone within reach;with bed alarm set;Other (comment) (NT present giving pt bed bath (NT to finish setting  pt up when finished)) Nurse Communication: Mobility status;Precautions PT Visit Diagnosis: Other abnormalities of gait and mobility (R26.89);Muscle weakness (generalized) (M62.81);Pain    Time: 1610-9604 PT Time Calculation (min) (ACUTE ONLY): 36 min   Charges:   PT Evaluation $PT Eval Low Complexity: 1 Low PT Treatments $Therapeutic Activity: 8-22 mins PT General Charges $$ ACUTE PT VISIT: 1 Visit        Hendricks Limes, PT 07/30/22, 4:12 PM

## 2022-07-30 NOTE — Progress Notes (Signed)
Progress Note   Patient: Robyn Barton QMV:[id number] DOB: [birth year] DOA: 07/29/2022     1 DOS: the patient was seen and examined on 07/30/2022   Brief hospital course: HPI on admission 07/29/22:  "Robyn Barton is a 77 y.o. female with medical history significant of COPD not on oxygen, HTN, HLD, depression with anxiety, CKD-3A, small cell lung cancer (s/p of surgery and radiation therapy 2018), who presented from SNF with shortness of breath and in respiratory distress with O2 desaturation to 73% on 3L [place] and 78% on non rebreather. Patient was placed on CPAP by EMS, with oxygen rising to 89%, but still had severe respiratory distress at arrival to ED, using accessory muscle for breathing, BiPAP was started.  On admission, patient reported dry cough, but no other complaints.  No UTI symptoms, but  currently being treated for UTI with Macrobid since 7/13.  Evaluation in the ED was notable for -   WBC 26.3, negative COVID, flu and RSV, positive urinalysis (turbid appearance, moderate amount leukocyte, many bacteria, WBC> 50), BNP 2190, stable renal function.  Temp 99.4, initial BP 223/73, which decreased to 87/59 with a MAP 69 after started BiPAP, then improved to 109/70 with IV fluid resuscitation.  X-ray of pelvis negative.  Chest xray showed increased opacity of right lower third of the chest felt to be pleural effusion.  Possibility of underlying PNA or recurrent tumor not able to be excluded.   Patient was admitted to PCU, started on IV steroids, IV antibiotics pending culture results.  7/16: O2 sats stable on 2 L/min [place] O2.  Pt denies respiratory symptoms.  Is confused, disoriented at times, not answering questions appropriately, but talks about unrelated things. Blood cultures growing Proteus spp.   Broad spectum antibiotics were narrowed to Rocephin 2g IV daily.   Assessment and Plan: * Acute respiratory failure with hypoxia and hypercapnia (HCC) 2/2 Right Pleural Effusion, and ? Acute CHF  (no prior echo available) Initially required BiPAP. No baseline need for oxygen. 7/16: on 2 L/min  O2  --Mgmt of underlying issues as outlined --US Thoracentesis ordered & fluid studies + cytology to determine etiology (concern for small cell lung ca recurrence) --Incentive spirometer --Wean O2 as tolerated, target sats > 90%   Elevated brain natriuretic peptide (BNP) level BNP was 2190 on admission.  With Right pleural effusion on unclear etiology.   No prior echo available. --Check 2D Echo --Thoracentesis of pleural effusion & fluid studies - pending --Starting IV Lasix 20 mg daily --Daily weights & strict I/O's --Monitor renal function and electrolytes   Bacteremia Initial blood cultures growing Proteus spp. Suspect source is likely UTI.  Pt has been on Macrobid --Change antibiotics to Rocephin 2 g IV daily --Repeat blood cultures after 48 hrs --Monitor fever curve, CBC, hemodynamics   Severe sepsis (HCC) POA with leukocytosis, tachycardia, tachypnea. Elevated lactate consistent with organ dysfunction and severe sepsis. Infection source in Proteus Bacteremia --Continue IV antibiotics --Follow cultures  HCAP (healthcare-associated pneumonia) (Possible) -- on CXR, significant R pleural effusion, cannot exclude underlying pneumonia or tumor recurrence.  Suspicion for pneumonia is lower at this time.   --On IV Rocephin for Proteus Bacteremia (likely UTI source) --Will defer other antibiotics for PNA at this time --Mgmt of COPD and respiratory failure as outlined --Supportive care  COPD exacerbation (HCC) Continue IV Solu-medrol for now. Likely transition to Prednisone tomorrow Wean oxygen as tolerated  Microcytic anemia Hbg was 11.4 on admission >> 8.6 this AM. No evidence of  bleeding.  She did get sepsis IV fluids, so might be dilutional. --Repeat Hbg this afternoon --CBC in AM --Further evaluation based on trend  Hypokalemia K 3.3 this AM, replacement  ordered. --Monitor BMP, check Mg level  HTN (hypertension) Holding BP medications due to soft blood pressure on admission. 7/16: BP's remain soft but stable --Continue holding meds  Chronic kidney disease, stage 3a (HCC) Renal function stable. Monitor BMP.  Pressure injury of skin POA.  I agree with the wound description as outlined below.   --Continue frequent repositioning --Monitor for signs of infection --Diligent hygiene  --Wound care per orders  Pressure Injury 07/29/22 Sacrum Stage 2 -  Partial thickness loss of dermis presenting as a shallow open injury with a red, pink wound bed without slough. (Active)  07/29/22 1706  Location: Sacrum  Location Orientation:   Staging: Stage 2 -  Partial thickness loss of dermis presenting as a shallow open injury with a red, pink wound bed without slough.  Wound Description (Comments):   Present on Admission: Yes    Protein-calorie malnutrition, severe Body mass index is 17.97 kg/m. Dietitian consult, recommendations appreciated Ensures  Depression with anxiety Continue home medications   UTI (urinary tract infection)-resolved as of 07/30/2022 Had been started on Macrobid 7/13. Urine culture here grew multiple species, but blood cultures growing Proteus sp, a typical Uti organism. --Continue IV Rocephin --Follow blood culture for susceptibilities  Small cell lung cancer, right upper lobe (HCC) Diagnosed in 2018. Per son is followed by palliative care at [facility].  We discussed possibility that pleural effusion might be related to cancer occurrence.  He agreed to thoracentesis. Cytology ordered on fluid studies.        Subjective: Pt awake resting in bed this AM.  She seems confused, when asked questions, she talks about unrelated things.  Seems to have paranoid thoughts that we are trying to take her money and sign legal documents about things.  No family present at the time.  Pt denies any complaints and starts perseverating  on getting out of here.   Spoke to son by phone in afternoon.  Cognition recently declining.  Paranoid thoughts noted at SNF and he reports palliative care team had started something for anxiety due to that.    Physical Exam: Vitals:   07/30/22 0048 07/30/22 0333 07/30/22 0924 07/30/22 1134  BP: 107/67 128/61 (!) 120/57 101/61  Pulse: 87 87 88 80  Resp: (!) 22  18 18   Temp: 98.1 F (36.7 C) 97.7 F (36.5 C) (!) 97.3 F (36.3 C) 97.7 F (36.5 C)  TempSrc: Oral Oral Oral Oral  SpO2: 100% 100% 100% 100%  Weight:      Height:       General exam: awake, alert, no acute distress, very frail and chronically ill appearing HEENT: atraumatic, clear conjunctiva, anicteric sclera, moist mucus membranes, hearing grossly normal  Respiratory system: CTAB diminished on the right, no wheezes, normal respiratory effort at rest on 2 L/min [place] O2. Cardiovascular system: normal S1/S2, RRR, no JVD, murmurs, rubs, gallops, no pedal edema.   Gastrointestinal system: soft, NT, ND, no HSM felt, +bowel sounds. Central nervous system: A&O x self and place, otherwise does not participate in exam. no gross focal neurologic deficits Extremities: moves all, no edema, normal tone Skin: dry, intact, normal temperature Psychiatry: normal mood, congruent affect, abnormal judgement and insight    Data Reviewed:  Notable labs --- K 3.3, bicarb 18, glucose 133, BUN 53, Cr improved 1.29, anion gap  16, lactate normalized 1.3 >> 1.0, WBC improved 17.9, Hbg 8.6 from 11.4 (no evidence of bleeding noted)   Family Communication: Spoke with son (POA) Todd by phone this afternoon  Disposition: Status is: Inpatient Remains inpatient appropriate because: severity of illness and ongoing evaluation as above.   Planned Discharge Destination: Skilled nursing facility    Time spent: 56 minutes with > 50% at bedside and in coordinating care  Author: Pennie Banter, DO 07/30/2022 3:39 PM  For on call review  [email or web address].

## 2022-07-30 NOTE — Assessment & Plan Note (Deleted)
Body mass index is 17.97 kg/m. Dietitian consult, recommendations appreciated

## 2022-07-30 NOTE — Assessment & Plan Note (Signed)
POA with leukocytosis, tachycardia, tachypnea. Elevated lactate consistent with organ dysfunction and severe sepsis. Infection source in Proteus Bacteremia --Continue IV antibiotics --Follow cultures

## 2022-07-30 NOTE — Assessment & Plan Note (Signed)
-   Continue home medications 

## 2022-07-30 NOTE — Progress Notes (Signed)
Initial Nutrition Assessment  DOCUMENTATION CODES:   Severe malnutrition in context of chronic illness, Underweight  INTERVENTION:   -Ensure Enlive po TID, each supplement provides 350 kcal and 20 grams of protein -MVI with minerals daily -500 mg vitamin C BID -220 mg zinc sulfate daily -30 ml Prosource Plus BID, each supplement provides 100 kcals and 15 grams protein -Liberalize diet to regular for widest variety of meal selections -Feeding assistance with meals  NUTRITION DIAGNOSIS:   Severe Malnutrition related to chronic illness (COPD) as evidenced by moderate muscle depletion, severe muscle depletion, moderate fat depletion, severe fat depletion.  GOAL:   Patient will meet greater than or equal to 90% of their needs  MONITOR:   PO intake, Supplement acceptance  REASON FOR ASSESSMENT:   Consult Assessment of nutrition requirement/status  ASSESSMENT:   Pt with medical history significant of COPD not on oxygen, HTN, HLD, depression with anxiety, CKD-3A, small cell lung cancer (s/p of surgery and radiation therapy 2018), who presents with shortness of breath.  Pt admitted with respiration failure secondary to HCAP and COPD.   Reviewed I/O's: +94 ml x 24 hours  Pt lying in bed at time of visit. Pt confused and unable to provide history. Noted pt's breakfast tray was untouched. Offered to assist pt with feeding and tray set up, but she refused. Offered to order alternate meal for pt, but refused. Pt states that she does not want to eat as she is afraid the food will make her skin change color. RD attempted to reorient pt to place and situation unsuccessfully.   Reviewed wt hx; pt has experienced a 22.9% wt loss over the past 10 months, which is significant for time frame.   Medications reviewed and include solu-medrol and remeron.   Labs reviewed: K: 3.3, CBGS: 113.    NUTRITION - FOCUSED PHYSICAL EXAM:  Flowsheet Row Most Recent Value  Orbital Region Severe  depletion  Upper Arm Region Severe depletion  Thoracic and Lumbar Region Moderate depletion  Buccal Region Mild depletion  Temple Region Moderate depletion  Clavicle Bone Region Severe depletion  Clavicle and Acromion Bone Region Severe depletion  Scapular Bone Region Severe depletion  Dorsal Hand Mild depletion  Patellar Region Mild depletion  Anterior Thigh Region Mild depletion  Posterior Calf Region Mild depletion  Edema (RD Assessment) Mild  Hair Reviewed  Eyes Reviewed  Mouth Reviewed  Skin Reviewed  Nails Reviewed       Diet Order:   Diet Order             Diet Heart Fluid consistency: Thin  Diet effective now                   EDUCATION NEEDS:   Education needs have been addressed  Skin:  Skin Assessment: Skin Integrity Issues: Skin Integrity Issues:: Stage II Stage II: sacrum  Last BM:  07/29/22 (type 6)  Height:   Ht Readings from Last 1 Encounters:  07/29/22 5\' 4"  (1.626 m)    Weight:   Wt Readings from Last 1 Encounters:  07/29/22 47.5 kg    Ideal Body Weight:  54.5 kg  BMI:  Body mass index is 17.97 kg/m.  Estimated Nutritional Needs:   Kcal:  1500-1700  Protein:  80-95 grams  Fluid:  > 1.5 L    Levada Schilling, RD, LDN, CDCES Registered Dietitian II Certified Diabetes Care and Education Specialist Please refer to [facility] for RD and/or RD on-call/weekend/after hours pager

## 2022-07-30 NOTE — Assessment & Plan Note (Signed)
Continue IV Solu-medrol for now. Likely transition to Prednisone tomorrow Wean oxygen as tolerated

## 2022-07-30 NOTE — Assessment & Plan Note (Addendum)
2/2 Right Pleural Effusion, and ? Acute CHF (no prior echo available) Initially required BiPAP. No baseline need for oxygen. 7/16: on 2 L/min [place] O2  --Mgmt of underlying issues as outlined --US Thoracentesis ordered & fluid studies + cytology to determine etiology (concern for small cell lung ca recurrence) --Incentive spirometer --Wean O2 as tolerated, target sats > 90%

## 2022-07-30 NOTE — Assessment & Plan Note (Signed)
Hbg was 11.4 on admission >> 8.6 this AM. No evidence of bleeding.  She did get sepsis IV fluids, so might be dilutional. --Repeat Hbg this afternoon --CBC in AM --Further evaluation based on trend

## 2022-07-30 NOTE — Consult Note (Signed)
PHARMACY - PHYSICIAN COMMUNICATION CRITICAL VALUE ALERT - BLOOD CULTURE IDENTIFICATION (BCID)  Robyn Barton is an 77 y.o. female who presented to [facility] on 07/29/2022 with a chief complaint of SOB  Assessment:  1 out 4 aerobic bottle GNR BCID positive for proteus no resistance detected   Name of physician (or Provider) Contacted: Denton Lank   Current antibiotics: cefepime and vancomycin   Changes to prescribed antibiotics recommended: De-escalate to ceftriaxone  Response not received from provider;  current antibiotics are likely to cover the isolated organism.  Consider de-escalation soon.  Results for orders placed or performed during the hospital encounter of 07/29/22  Blood Culture ID Panel (Reflexed) (Collected: 07/29/2022  6:56 AM)  Result Value Ref Range   Enterococcus faecalis NOT DETECTED NOT DETECTED   Enterococcus Faecium NOT DETECTED NOT DETECTED   Listeria monocytogenes NOT DETECTED NOT DETECTED   Staphylococcus species NOT DETECTED NOT DETECTED   Staphylococcus aureus (BCID) NOT DETECTED NOT DETECTED   Staphylococcus epidermidis NOT DETECTED NOT DETECTED   Staphylococcus lugdunensis NOT DETECTED NOT DETECTED   Streptococcus species NOT DETECTED NOT DETECTED   Streptococcus agalactiae NOT DETECTED NOT DETECTED   Streptococcus pneumoniae NOT DETECTED NOT DETECTED   Streptococcus pyogenes NOT DETECTED NOT DETECTED   A.calcoaceticus-baumannii NOT DETECTED NOT DETECTED   Bacteroides fragilis NOT DETECTED NOT DETECTED   Enterobacterales DETECTED (A) NOT DETECTED   Enterobacter cloacae complex NOT DETECTED NOT DETECTED   Escherichia coli NOT DETECTED NOT DETECTED   Klebsiella aerogenes NOT DETECTED NOT DETECTED   Klebsiella oxytoca NOT DETECTED NOT DETECTED   Klebsiella pneumoniae NOT DETECTED NOT DETECTED   Proteus species DETECTED (A) NOT DETECTED   Salmonella species NOT DETECTED NOT DETECTED   Serratia marcescens NOT DETECTED NOT DETECTED   Haemophilus influenzae  NOT DETECTED NOT DETECTED   Neisseria meningitidis NOT DETECTED NOT DETECTED   Pseudomonas aeruginosa NOT DETECTED NOT DETECTED   Stenotrophomonas maltophilia NOT DETECTED NOT DETECTED   Candida albicans NOT DETECTED NOT DETECTED   Candida auris NOT DETECTED NOT DETECTED   Candida glabrata NOT DETECTED NOT DETECTED   Candida krusei NOT DETECTED NOT DETECTED   Candida parapsilosis NOT DETECTED NOT DETECTED   Candida tropicalis NOT DETECTED NOT DETECTED   Cryptococcus neoformans/gattii NOT DETECTED NOT DETECTED   CTX-M ESBL NOT DETECTED NOT DETECTED   Carbapenem resistance IMP NOT DETECTED NOT DETECTED   Carbapenem resistance KPC NOT DETECTED NOT DETECTED   Carbapenem resistance NDM NOT DETECTED NOT DETECTED   Carbapenem resist OXA 48 LIKE NOT DETECTED NOT DETECTED   Carbapenem resistance VIM NOT DETECTED NOT DETECTED    [name] 07/30/2022  6:57 AM

## 2022-07-30 NOTE — Assessment & Plan Note (Signed)
Holding BP medications due to soft blood pressure on admission. 7/16: BP's remain soft but stable --Continue holding meds

## 2022-07-30 NOTE — Assessment & Plan Note (Signed)
Diagnosed in 2018. Per son is followed by palliative care at [facility].  We discussed possibility that pleural effusion might be related to cancer occurrence.  He agreed to thoracentesis. Cytology ordered on fluid studies.

## 2022-07-30 NOTE — Hospital Course (Addendum)
HPI on admission 07/29/22:  "Robyn Barton is a 77 y.o. female with medical history significant of COPD not on oxygen, HTN, HLD, depression with anxiety, CKD-3A, small cell lung cancer (s/p of surgery and radiation therapy 2018), who presented from SNF with shortness of breath and in respiratory distress with O2 desaturation to 73% on 3L [place] and 78% on non rebreather. Patient was placed on CPAP by EMS, with oxygen rising to 89%, but still had severe respiratory distress at arrival to ED, using accessory muscle for breathing, BiPAP was started.  On admission, patient reported dry cough, but no other complaints.  No UTI symptoms, but  currently being treated for UTI with Macrobid since 7/13.  Evaluation in the ED was notable for -   WBC 26.3, negative COVID, flu and RSV, positive urinalysis (turbid appearance, moderate amount leukocyte, many bacteria, WBC> 50), BNP 2190, stable renal function.  Temp 99.4, initial BP 223/73, which decreased to 87/59 with a MAP 69 after started BiPAP, then improved to 109/70 with IV fluid resuscitation.  X-ray of pelvis negative.  Chest xray showed increased opacity of right lower third of the chest felt to be pleural effusion.  Possibility of underlying PNA or recurrent tumor not able to be excluded.   Patient was admitted to PCU, started on IV steroids, IV antibiotics pending culture results.  7/16: O2 sats stable on 2 L/min [place] O2.  Pt denies respiratory symptoms.  Is confused, disoriented at times, not answering questions appropriately, but talks about unrelated things. Blood cultures growing Proteus spp.   Broad spectum antibiotics were narrowed to Rocephin 2g IV daily.

## 2022-07-30 NOTE — Assessment & Plan Note (Addendum)
(  Possible) -- on CXR, significant R pleural effusion, cannot exclude underlying pneumonia or tumor recurrence.  Suspicion for pneumonia is lower at this time.   --On IV Rocephin for Proteus Bacteremia (likely UTI source) --Will defer other antibiotics for PNA at this time --Mgmt of COPD and respiratory failure as outlined --Supportive care

## 2022-07-30 NOTE — Assessment & Plan Note (Signed)
POA.  I agree with the wound description as outlined below.   --Continue frequent repositioning --Monitor for signs of infection --Diligent hygiene  --Wound care per orders  Pressure Injury 07/29/22 Sacrum Stage 2 -  Partial thickness loss of dermis presenting as a shallow open injury with a red, pink wound bed without slough. (Active)  07/29/22 1706  Location: Sacrum  Location Orientation:   Staging: Stage 2 -  Partial thickness loss of dermis presenting as a shallow open injury with a red, pink wound bed without slough.  Wound Description (Comments):   Present on Admission: Yes

## 2022-07-31 ENCOUNTER — Inpatient Hospital Stay: Payer: Medicare PPO

## 2022-07-31 ENCOUNTER — Inpatient Hospital Stay (HOSPITAL_COMMUNITY)
Admit: 2022-07-31 | Discharge: 2022-07-31 | Disposition: A | Discharge status: Home / Self Care | Payer: Medicare PPO | Attending: Internal Medicine | Admitting: Internal Medicine

## 2022-07-31 DIAGNOSIS — R012 Other cardiac sounds: Secondary | ICD-10-CM

## 2022-07-31 DIAGNOSIS — J9601 Acute respiratory failure with hypoxia: Secondary | ICD-10-CM | POA: Diagnosis not present

## 2022-07-31 DIAGNOSIS — J9602 Acute respiratory failure with hypercapnia: Secondary | ICD-10-CM | POA: Diagnosis not present

## 2022-07-31 LAB — BLOOD CULTURE ID PANEL (REFLEXED) - BCID2

## 2022-07-31 LAB — CBC
HCT: 26.1 % — ABNORMAL LOW (ref 36.0–46.0)
Hemoglobin: 8.2 g/dL — ABNORMAL LOW (ref 12.0–15.0)
MCH: 22.8 pg — ABNORMAL LOW (ref 26.0–34.0)
MCHC: 31.4 g/dL (ref 30.0–36.0)
MCV: 72.5 fL — ABNORMAL LOW (ref 80.0–100.0)
Platelets: 216 10*3/uL (ref 150–400)
RBC: 3.6 MIL/uL — ABNORMAL LOW (ref 3.87–5.11)
RDW: 18.9 % — ABNORMAL HIGH (ref 11.5–15.5)
WBC: 28.7 10*3/uL — ABNORMAL HIGH (ref 4.0–10.5)
nRBC: 0 % (ref 0.0–0.2)

## 2022-07-31 LAB — MAGNESIUM: Magnesium: 1.8 mg/dL (ref 1.7–2.4)

## 2022-07-31 LAB — BASIC METABOLIC PANEL
Anion gap: 11 (ref 5–15)
BUN: 56 mg/dL — ABNORMAL HIGH (ref 8–23)
CO2: 21 mmol/L — ABNORMAL LOW (ref 22–32)
Calcium: 8.9 mg/dL (ref 8.9–10.3)
Chloride: 107 mmol/L (ref 98–111)
Creatinine, Ser: 1.37 mg/dL — ABNORMAL HIGH (ref 0.44–1.00)
GFR, Estimated: 39 mL/min — ABNORMAL LOW (ref >60)
Glucose, Bld: 151 mg/dL — ABNORMAL HIGH (ref 70–99)
Potassium: 2.5 mmol/L — CL (ref 3.5–5.1)
Sodium: 139 mmol/L (ref 135–145)

## 2022-07-31 LAB — LEGIONELLA PNEUMOPHILA SEROGP 1 UR AG: L. pneumophila Serogp 1 Ur Ag: NEGATIVE

## 2022-07-31 LAB — ECHOCARDIOGRAM COMPLETE
Est EF: 50
Height: 64 in
S' Lateral: 2.7 cm
Weight: 1675.5 oz

## 2022-07-31 MED ORDER — POTASSIUM CHLORIDE 20 MEQ PO PACK
40.0000 meq | PACK | Freq: Once | ORAL | Status: AC
  Administered 2022-07-31: 40 meq via ORAL
  Filled 2022-07-31: qty 2

## 2022-07-31 MED ORDER — PREDNISONE 20 MG PO TABS
40.0000 mg | ORAL_TABLET | Freq: Every day | ORAL | Status: DC
  Administered 2022-08-01: 40 mg via ORAL
  Filled 2022-07-31: qty 2

## 2022-07-31 NOTE — Progress Notes (Signed)
PT Cancellation Note  Patient Details Name: Robyn Barton MRN: [id number] DOB: 12-Nov-1945   Cancelled Treatment:    Reason Eval/Treat Not Completed: Medical issues which prohibited therapy: Pt's recent K 2.5 and trending down falling outside guidelines for participation with PT services.  Will attempt to see pt at a future date/time as medically appropriate.     Ovidio Hanger PT, DPT 07/31/22, 1:43 PM

## 2022-07-31 NOTE — Progress Notes (Signed)
PROGRESS NOTE    Robyn Barton  ZOX:[id number] DOB: Dec 13, 1945 DOA: 07/29/2022 PCP: Melvenia Beam, MD  [id number]/[id number]-AA  LOS: 2 days   Brief hospital course:   Assessment & Plan: [name] is a 77 y.o. female with medical history significant of COPD not on oxygen, HTN, HLD, depression with anxiety, CKD-3A, small cell lung cancer (s/p of surgery and radiation therapy 2018), who presented from SNF with shortness of breath and in respiratory distress with O2 desaturation to 73% on 3L [place] and 78% on non rebreather. Patient was placed on CPAP by EMS, with oxygen rising to 89%, but still had severe respiratory distress at arrival to ED, using accessory muscle for breathing, BiPAP was started.  On admission, patient reported dry cough, but no other complaints.  No UTI symptoms, but  currently being treated for UTI with Macrobid since 7/13.   * Acute respiratory failure with hypoxia and hypercapnia (HCC) 2/2 Right Pleural Effusion, and ? Acute CHF (no prior echo available) Initially required BiPAP.  Started IV Lasix 20 mg daily No baseline need for oxygen. 7/16: on 2 L/min [place] O2 --thora attempt today, not enough to tap --cont IV lasix 20 daily --Incentive spirometer --Wean O2 as tolerated, target sats > 90%  Proteus Bacteremia Initial blood cultures growing Proteus spp. Suspect source is likely UTI.  Pt has been on Macrobid to which proteus is resistant. --Changed antibiotics to Rocephin 2 g IV daily --cont ceftriaxone   Severe sepsis (HCC) POA with leukocytosis, tachycardia, tachypnea. Elevated lactate consistent with organ dysfunction and severe sepsis. Infection source in Proteus Bacteremia --Continue IV antibiotics --Follow cultures   HCAP, ruled out   COPD exacerbation (HCC) --started on IV solumedrol --transition to prednisone tomorrow   Microcytic anemia Hbg was 11.4 on admission >> 8.6 this AM. No evidence of bleeding.  She did get sepsis IV fluids, so might be  dilutional. --Repeat Hbg this afternoon --CBC in AM --Further evaluation based on trend   Hypokalemia --monitor and replete PRN   HTN (hypertension) Holding BP medications due to soft blood pressure on admission. 7/16: BP's remain soft but stable --Continue holding meds   Chronic kidney disease, stage 3a (HCC) Renal function stable. Monitor BMP.   Pressure injury of skin POA.  I agree with the wound description as outlined below.   --Continue frequent repositioning --Monitor for signs of infection --Diligent hygiene  --Wound care per orders   Pressure Injury 07/29/22 Sacrum Stage 2 -  Partial thickness loss of dermis presenting as a shallow open injury with a red, pink wound bed without slough. (Active)  07/29/22 1706  Location: Sacrum  Location Orientation:   Staging: Stage 2 -  Partial thickness loss of dermis presenting as a shallow open injury with a red, pink wound bed without slough.  Wound Description (Comments):   Present on Admission: Yes      Protein-calorie malnutrition, severe Body mass index is 17.97 kg/m. Dietitian consult, recommendations appreciated Ensures   Depression with anxiety Continue home medications    UTI (urinary tract infection)-resolved as of 07/30/2022 Had been started on Macrobid 7/13. Urine culture here grew multiple species, but blood cultures growing Proteus sp, a typical Uti organism. --Continue IV Rocephin --Follow blood culture for susceptibilities   Small cell lung cancer, right upper lobe (HCC) Diagnosed in 2018. Per son is followed by palliative care at [facility].      DVT prophylaxis: Lovenox SQ Code Status: DNR  Family Communication:  Level of care: Progressive  Dispo:   The patient is from: SNF Anticipated d/c is to: SNF Anticipated d/c date is: 2-3 days   Subjective and Interval History:  Pt admitted to dyspnea.   Objective: Vitals:   07/31/22 0751 07/31/22 1224 07/31/22 1732 07/31/22 2005  BP: 118/75 (!) 109/52  119/65 (!) 115/51  Pulse: 93 72 70 68  Resp: 20 20 (!) 25 20  Temp: (!) 97.4 F (36.3 C) (!) 97.4 F (36.3 C)  98.1 F (36.7 C)  TempSrc:      SpO2: 100% 100% 100% 100%  Weight:      Height:        Intake/Output Summary (Last 24 hours) at 07/31/2022 2127 Last data filed at 07/31/2022 1919 Gross per 24 hour  Intake 480 ml  Output 850 ml  Net -370 ml   Filed Weights   07/29/22 0653 07/29/22 2116  Weight: 50.4 kg 47.5 kg    Examination:   Constitutional: NAD, alert HEENT: conjunctivae and lids normal, EOMI CV: No cyanosis.   RESP: normal respiratory effort Extremities: left hand more swollen, could not hold left arm up SKIN: warm, dry   Data Reviewed: I have personally reviewed labs and imaging studies  Time spent: 50 minutes  Darlin Priestly, MD [facility] If 7PM-7AM, please contact night-coverage 07/31/2022, 9:27 PM

## 2022-07-31 NOTE — Plan of Care (Signed)
  Problem: Education: Goal: Knowledge of disease or condition will improve Outcome: Progressing Goal: Knowledge of the prescribed therapeutic regimen will improve Outcome: Progressing Goal: Individualized Educational Video(s) Outcome: Progressing   Problem: Activity: Goal: Will verbalize the importance of balancing activity with adequate rest periods Outcome: Progressing   Problem: Respiratory: Goal: Ability to maintain a clear airway will improve Outcome: Progressing   Problem: Clinical Measurements: Goal: Ability to maintain a body temperature in the normal range will improve Outcome: Progressing

## 2022-07-31 NOTE — Progress Notes (Addendum)
PHARMACY - PHYSICIAN COMMUNICATION CRITICAL VALUE ALERT - BLOOD CULTURE IDENTIFICATION (BCID)  Robyn Barton is an 77 y.o. female who presented to [facility] on 07/29/2022 with a chief complaint of shortness of breath  Assessment:  7/16 blood cx was growing GNR in 1/4 bottles, BCID returned as proteus species.  Patient with urine cx 7/13 grew P. Mirabilis (resistant to nitrofurantion, as would expect). Patient reportedly started on nitrofurantion at time of urine cx collection.  Blood culture today with GPC in 1 of 4 bottles, BCID Staphylococcus species (NOT S aureus or S epidermidis)  Name of physician (or Provider) Contacted: Dr Fran Lowes  Current antibiotics: Ceftriaxone for proteus bacteremia/UTI  Changes to prescribed antibiotics recommended:  Monitor on current antibiotics, Staphylococcus species likely contaminant.  Treat the proteus.    Results for orders placed or performed during the hospital encounter of 07/29/22  Blood Culture ID Panel (Reflexed) (Collected: 07/29/2022  7:51 AM)  Result Value Ref Range   Enterococcus faecalis NOT DETECTED NOT DETECTED   Enterococcus Faecium NOT DETECTED NOT DETECTED   Listeria monocytogenes NOT DETECTED NOT DETECTED   Staphylococcus species DETECTED (A) NOT DETECTED   Staphylococcus aureus (BCID) NOT DETECTED NOT DETECTED   Staphylococcus epidermidis NOT DETECTED NOT DETECTED   Staphylococcus lugdunensis NOT DETECTED NOT DETECTED   Streptococcus species NOT DETECTED NOT DETECTED   Streptococcus agalactiae NOT DETECTED NOT DETECTED   Streptococcus pneumoniae NOT DETECTED NOT DETECTED   Streptococcus pyogenes NOT DETECTED NOT DETECTED   A.calcoaceticus-baumannii NOT DETECTED NOT DETECTED   Bacteroides fragilis NOT DETECTED NOT DETECTED   Enterobacterales NOT DETECTED NOT DETECTED   Enterobacter cloacae complex NOT DETECTED NOT DETECTED   Escherichia coli NOT DETECTED NOT DETECTED   Klebsiella aerogenes NOT DETECTED NOT DETECTED   Klebsiella  oxytoca NOT DETECTED NOT DETECTED   Klebsiella pneumoniae NOT DETECTED NOT DETECTED   Proteus species NOT DETECTED NOT DETECTED   Salmonella species NOT DETECTED NOT DETECTED   Serratia marcescens NOT DETECTED NOT DETECTED   Haemophilus influenzae NOT DETECTED NOT DETECTED   Neisseria meningitidis NOT DETECTED NOT DETECTED   Pseudomonas aeruginosa NOT DETECTED NOT DETECTED   Stenotrophomonas maltophilia NOT DETECTED NOT DETECTED   Candida albicans NOT DETECTED NOT DETECTED   Candida auris NOT DETECTED NOT DETECTED   Candida glabrata NOT DETECTED NOT DETECTED   Candida krusei NOT DETECTED NOT DETECTED   Candida parapsilosis NOT DETECTED NOT DETECTED   Candida tropicalis NOT DETECTED NOT DETECTED   Cryptococcus neoformans/gattii NOT DETECTED NOT DETECTED    Juliette Alcide, PharmD, BCPS, BCIDP Work Cell: [phone] 07/31/2022 11:21 AM

## 2022-07-31 NOTE — Progress Notes (Signed)
*  PRELIMINARY RESULTS* Echocardiogram 2D Echocardiogram has been performed.  Robyn Barton 07/31/2022, 1:00 PM

## 2022-08-01 DIAGNOSIS — J9601 Acute respiratory failure with hypoxia: Secondary | ICD-10-CM | POA: Diagnosis not present

## 2022-08-01 DIAGNOSIS — J9602 Acute respiratory failure with hypercapnia: Secondary | ICD-10-CM | POA: Diagnosis not present

## 2022-08-01 LAB — CBC
HCT: 27.3 % — ABNORMAL LOW (ref 36.0–46.0)
Hemoglobin: 8.6 g/dL — ABNORMAL LOW (ref 12.0–15.0)
MCH: 22.9 pg — ABNORMAL LOW (ref 26.0–34.0)
MCHC: 31.5 g/dL (ref 30.0–36.0)
MCV: 72.8 fL — ABNORMAL LOW (ref 80.0–100.0)
Platelets: 197 10*3/uL (ref 150–400)
RBC: 3.75 MIL/uL — ABNORMAL LOW (ref 3.87–5.11)
RDW: 19.5 % — ABNORMAL HIGH (ref 11.5–15.5)
WBC: 28.3 10*3/uL — ABNORMAL HIGH (ref 4.0–10.5)
nRBC: 0.1 % (ref 0.0–0.2)

## 2022-08-01 LAB — MAGNESIUM: Magnesium: 1.8 mg/dL (ref 1.7–2.4)

## 2022-08-01 LAB — CULTURE, BLOOD (ROUTINE X 2): Special Requests: ADEQUATE

## 2022-08-01 LAB — BASIC METABOLIC PANEL
Anion gap: 9 (ref 5–15)
BUN: 68 mg/dL — ABNORMAL HIGH (ref 8–23)
CO2: 22 mmol/L (ref 22–32)
Calcium: 8.9 mg/dL (ref 8.9–10.3)
Chloride: 106 mmol/L (ref 98–111)
Creatinine, Ser: 1.26 mg/dL — ABNORMAL HIGH (ref 0.44–1.00)
GFR, Estimated: 43 mL/min — ABNORMAL LOW (ref >60)
Glucose, Bld: 109 mg/dL — ABNORMAL HIGH (ref 70–99)
Potassium: 3.9 mmol/L (ref 3.5–5.1)
Sodium: 137 mmol/L (ref 135–145)

## 2022-08-01 MED ORDER — MORPHINE 100MG IN NS 100ML (1MG/ML) PREMIX INFUSION
1.0000 mg/h | INTRAVENOUS | Status: DC
  Administered 2022-08-01: 1 mg/h via INTRAVENOUS
  Filled 2022-08-01: qty 100

## 2022-08-01 NOTE — Progress Notes (Signed)
I had goals of care discussion with daughter at bedside and pt's sister (healthcare POA) on speaker phone.  Family made the decision to forgo treatment and make pt comfort care.    Will start low-dose morphine gtt to treat pt's pain and anxiety.  Will send referral to hospice facility tomorrow.

## 2022-08-01 NOTE — Progress Notes (Signed)
PT Cancellation Note  Patient Details Name: Robyn Barton MRN: [id number] DOB: February 05, 1945   Cancelled Treatment:    Reason Eval/Treat Not Completed: Other (comment). PT able to speak with facility today to confirm pt's PLOF. Per facility pt at baseline (at least for the last several months) has been bedbound with hoyer lift transfers to [facility], dependent for ADLs, but was able to feed herself.  Pt had attempted to work with physical therapy in the past but was noncompliant. Due to pt being at her mobility baseline, PT to sign off. Please re-consult if acute PT needs arise.   Olga Coaster PT, DPT 3:55 PM,08/01/22

## 2022-08-01 NOTE — Progress Notes (Signed)
PROGRESS NOTE    Robyn Barton  ZSW:[id number] DOB: Jan 07, 1946 DOA: 07/29/2022 PCP: Melvenia Beam, MD  [id number]  LOS: 3 days   Brief hospital course:   Assessment & Plan: Robyn Barton is a 77 y.o. female with medical history significant of COPD not on oxygen, HTN, HLD, depression with anxiety, CKD-3A, small cell lung cancer (s/p of surgery and radiation therapy 2018), who presented from SNF with shortness of breath and in respiratory distress with O2 desaturation to 73% on 3L [place] and 78% on non rebreather. Patient was placed on CPAP by EMS, with oxygen rising to 89%, but still had severe respiratory distress at arrival to ED, using accessory muscle for breathing, BiPAP was started.  On admission, patient reported dry cough, but no other complaints.  No UTI symptoms, but  currently being treated for UTI with Macrobid since 7/13.   * Acute respiratory failure with hypoxia and hypercapnia (HCC) 2/2 Right Pleural Effusion, and ? Acute CHF (no prior echo available) Initially required BiPAP.  Started IV Lasix 20 mg daily No baseline need for oxygen. 7/16: on 2 L/min [place] O2 --thora attempted, not enough to tap --cont IV lasix 20 daily --Incentive spirometer --Wean O2 as tolerated, target sats > 90%  Proteus Bacteremia Initial blood cultures growing Proteus spp. Suspect source is likely UTI.  Pt has been on Macrobid to which proteus is resistant. --Changed antibiotics to Rocephin 2 g IV daily --cont ceftriaxone pending sensitivities   Severe sepsis (HCC) POA with leukocytosis, tachycardia, tachypnea. Elevated lactate consistent with organ dysfunction and severe sepsis. Infection source in Proteus Bacteremia --Continue IV antibiotics --Follow cultures   HCAP, ruled out   COPD exacerbation (HCC) --started on IV solumedrol --transition to prednisone 40 mg daily today   Microcytic anemia Hbg was 11.4 on admission >> 8.6 this AM. No evidence of bleeding.  She did get  sepsis IV fluids, so might be dilutional.   Hypokalemia --monitor and replete PRN   HTN (hypertension) Holding BP medications due to soft blood pressure on admission. 7/16: BP's remain soft but stable --Continue holding meds   Chronic kidney disease, stage 3a (HCC) Renal function stable. Monitor BMP.   Pressure injury of skin POA.  I agree with the wound description as outlined below.   --Continue frequent repositioning --Monitor for signs of infection --Diligent hygiene  --Wound care per orders   Pressure Injury 07/29/22 Sacrum Stage 2 -  Partial thickness loss of dermis presenting as a shallow open injury with a red, pink wound bed without slough. (Active)  07/29/22 1706  Location: Sacrum  Location Orientation:   Staging: Stage 2 -  Partial thickness loss of dermis presenting as a shallow open injury with a red, pink wound bed without slough.  Wound Description (Comments):   Present on Admission: Yes      Protein-calorie malnutrition, severe Body mass index is 17.97 kg/m. --supplements per dietician   Depression with anxiety Continue home medications    UTI (urinary tract infection) Had been started on Macrobid 7/13. Urine culture here grew multiple species, but blood cultures growing Proteus sp, a typical UTI organism.   Small cell lung cancer, right upper lobe (HCC) Diagnosed in 2018. Per son is followed by palliative care at [facility].      DVT prophylaxis: Lovenox SQ Code Status: DNR  Family Communication: daughter updated at bedside today. Level of care: Progressive Dispo:   The patient is from: SNF Anticipated d/c is to: SNF Anticipated d/c date is:  2-3 days   Subjective and Interval History:  Per daughter, pt complained of back pain.    Had goals of care discussion with daughter, who will speak with family for consideration of comfort care.   Objective: Vitals:   08/01/22 0335 08/01/22 0755 08/01/22 1341 08/01/22 1539  BP: (!) 113/52 (!) 118/55 (!)  123/53 129/67  Pulse: 72 71 76 69  Resp: 20 16 16 16   Temp: 97.9 F (36.6 C) (!) 97.4 F (36.3 C) (!) 97.4 F (36.3 C) 97.6 F (36.4 C)  TempSrc:  Oral Oral Oral  SpO2: 100% 100% 99% 100%  Weight:      Height:        Intake/Output Summary (Last 24 hours) at 08/01/2022 1756 Last data filed at 08/01/2022 1542 Gross per 24 hour  Intake 720 ml  Output 1140 ml  Net -420 ml   Filed Weights   07/29/22 0653 07/29/22 2116  Weight: 50.4 kg 47.5 kg    Examination:   Constitutional: NAD, alert, not oriented HEENT: conjunctivae and lids normal, EOMI CV: No cyanosis.   RESP: normal respiratory effort   Data Reviewed: I have personally reviewed labs and imaging studies  Time spent: 35 minutes  Darlin Priestly, MD [facility] If 7PM-7AM, please contact night-coverage 08/01/2022, 5:56 PM

## 2022-08-01 NOTE — Care Management Important Message (Signed)
Important Message  Patient Details  Name: Robyn Barton MRN: [id number] Date of Birth: December 01, 1945   Medicare Important Message Given:  Yes     Robyn Barton 08/01/2022, 2:24 PM

## 2022-08-01 NOTE — Progress Notes (Signed)
Patient's daughter came in and requested her mother to be in more comfort, and prefer her to have morphine for her pain if she complains of. MD made aware of.

## 2022-08-02 DIAGNOSIS — J9602 Acute respiratory failure with hypercapnia: Secondary | ICD-10-CM | POA: Diagnosis not present

## 2022-08-02 DIAGNOSIS — J9601 Acute respiratory failure with hypoxia: Secondary | ICD-10-CM | POA: Diagnosis not present

## 2022-08-02 MED ORDER — MORPHINE 100MG IN NS 100ML (1MG/ML) PREMIX INFUSION: 1.0000 mg/h | INTRAVENOUS | Status: DC

## 2022-08-02 NOTE — Discharge Summary (Incomplete)
Physician Discharge Summary   Robyn Barton  female DOB: 26-Dec-1945  UVO:[id number]  PCP: Melvenia Beam, MD  Admit date: 07/29/2022 Discharge date: 08/02/2022  Admitted From: SNF LTC Disposition:  hospice facility CODE STATUS: DNR  Discharge Instructions     No wound care   Complete by: As directed       Hospital Course:  For full details, please see H&P, progress notes, consult notes and ancillary notes.  Briefly,  Robyn Barton is a 77 y.o. female with medical history significant of COPD not on oxygen, HTN, depression with anxiety, CKD-3A, small cell lung cancer (s/p of surgery and radiation therapy 2018), who presented from SNF with shortness of breath and in respiratory distress with O2 desaturation to 73% on 3L [place] and 78% on non rebreather.   Patient was placed on CPAP by EMS, with oxygen rising to 89%, but still had severe respiratory distress at arrival to ED, using accessory muscle for breathing, BiPAP was started.  On admission, patient reported dry cough, but no other complaints.  No UTI symptoms, but currently being treated for UTI with Macrobid since 7/13.    Comfort Care status --Family requested comfort care for pt to reduce her suffering.  After goals of care discussion with family members, pt was made comfort care on 08/01/22, and approved for hospice facility.  * Acute respiratory failure with hypoxia and hypercapnia (HCC) 2/2 Right Pleural Effusion, and Possible Acute CHF  Initially required BiPAP.  Started IV Lasix 20 mg daily No baseline need for oxygen.  no prior echo available.  Current Echo showed LVEF 50%. 7/16: on 2 L/min [place] O2 --thora attempted, not enough to tap --received IV lasix 20 daily until comfort care.   Proteus Bacteremia Initial blood cultures growing Proteus spp. Suspect source is likely UTI.  Pt has been on Macrobid to which proteus is resistant. --Changed antibiotics to Rocephin 2 g IV daily which was continued until comfort  care.   Severe sepsis (HCC) POA with leukocytosis, tachycardia, tachypnea. Elevated lactate consistent with organ dysfunction and severe sepsis. Infection source in Proteus Bacteremia   HCAP, ruled out   COPD exacerbation (HCC) --started on IV solumedrol --transitioned to prednisone 40 mg daily and continued until comfort care.   Microcytic anemia   Hypokalemia   HTN (hypertension) Holding BP medications due to soft blood pressure on admission.   Chronic kidney disease, stage 3a (HCC) Renal function stable.   Pressure injury of skin POA.  I agree with the wound description as outlined below.     Pressure Injury 07/29/22 Sacrum Stage 2 -  Partial thickness loss of dermis presenting as a shallow open injury with a red, pink wound bed without slough. (Active)  07/29/22 1706  Location: Sacrum  Location Orientation:   Staging: Stage 2 -  Partial thickness loss of dermis presenting as a shallow open injury with a red, pink wound bed without slough.  Wound Description (Comments):   Present on Admission: Yes      Protein-calorie malnutrition, severe Body mass index is 17.97 kg/m.   Depression with anxiety   UTI (urinary tract infection) Had been started on Macrobid 7/13. Urine culture here grew multiple species, but blood cultures growing Proteus sp, a typical UTI organism.   Small cell lung cancer, right upper lobe (HCC) Diagnosed in 2018. Per son is followed by palliative care at [facility]   --Decision made for comfort care and pt was discharged to hospice facility.   Discharge  Diagnoses:  Principal Problem:   Acute respiratory failure with hypoxia and hypercapnia (HCC) Active Problems:   COPD exacerbation (HCC)   HCAP (healthcare-associated pneumonia)   Severe sepsis (HCC)   Bacteremia   Elevated brain natriuretic peptide (BNP) level   Chronic kidney disease, stage 3a (HCC)   HTN (hypertension)   Hypokalemia   Microcytic anemia   Depression with anxiety    Protein-calorie malnutrition, severe   Pressure injury of skin   Small cell lung cancer, right upper lobe (HCC)   30 Day Unplanned Readmission Risk Score    Flowsheet Row ED to Hosp-Admission (Current) from 07/29/2022 in [facility]U  30 Day Unplanned Readmission Risk Score (%) 21.39 Filed at 08/02/2022 0801       This score is the patient's risk of an unplanned readmission within 30 days of being discharged (0 -100%). The score is based on dignosis, age, lab data, medications, orders, and past utilization.   Low:  0-14.9   Medium: 15-21.9   High: 22-29.9   Extreme: 30 and above         Discharge Instructions:  Allergies as of 08/02/2022   No Known Allergies      Medication List     STOP taking these medications    acidophilus Caps capsule   albuterol 108 (90 Base) MCG/ACT inhaler Commonly known as: VENTOLIN HFA   bisacodyl 10 MG suppository Commonly known as: DULCOLAX   calcium carbonate 500 MG chewable tablet Commonly known as: TUMS - dosed in mg elemental calcium   cephALEXin 500 MG capsule Commonly known as: KEFLEX   dexamethasone 2 MG tablet Commonly known as: DECADRON   mirtazapine 15 MG tablet Commonly known as: REMERON   multivitamin tablet   nitrofurantoin (macrocrystal-monohydrate) 100 MG capsule Commonly known as: MACROBID   omeprazole 40 MG capsule Commonly known as: PRILOSEC   ondansetron 4 MG disintegrating tablet Commonly known as: ZOFRAN-ODT   polyethylene glycol 17 g packet Commonly known as: MIRALAX / GLYCOLAX   potassium chloride SA 20 MEQ tablet Commonly known as: KLOR-CON M   senna-docusate 8.6-50 MG tablet Commonly known as: Senokot-S   sertraline 100 MG tablet Commonly known as: ZOLOFT   sucralfate 1 g tablet Commonly known as: Carafate   tiotropium 18 MCG inhalation capsule Commonly known as: Spiriva HandiHaler   Trelegy Ellipta 100-62.5-25 MCG/ACT Aepb Generic drug:  Fluticasone-Umeclidin-Vilant       TAKE these medications    morphine 1 mg/mL Soln infusion Inject 1 mg/hr into the vein continuous.          No Known Allergies   The results of significant diagnostics from this hospitalization (including imaging, microbiology, ancillary and laboratory) are listed below for reference.   Consultations:   Procedures/Studies: [place] CHEST (PLEURAL EFFUSION)  Result Date: 07/31/2022 CLINICAL DATA:  Pleural effusion EXAM: CHEST ULTRASOUND COMPARISON:  Chest x-ray 07/29/2022 FINDINGS: Targeted sonography of the right chest performed to assess for pleural effusion. Only trace pleural effusion is visualized. There is atelectatic or consolidated parenchyma at the right base. IMPRESSION: Trace right pleural effusion. Electronically Signed   By: Jasmine Pang M.D.   On: 07/31/2022 18:47   ECHOCARDIOGRAM COMPLETE  Result Date: 07/31/2022    ECHOCARDIOGRAM REPORT   Patient Name:   TIASHA HELVIE Date of Exam: 07/31/2022 Medical Rec #:  [id number]     Height:       64.0 in Accession #:    [id number]    Weight:  104.7 lb Date of Birth:  27-Jan-1945     BSA:          1.486 m Patient Age:    49 years      BP:           118/75 mmHg Patient Gender: F             HR:           93 bpm. Exam Location:  [facility] Procedure: 2D Echo, Cardiac Doppler and Color Doppler Indications:     Other cardiac sounds R01.2  History:         Patient has prior history of Echocardiogram examinations, most                  recent 11/25/2016.  Sonographer:     Cristela Blue Referring Phys:  [id number] KELLY A GRIFFITH Diagnosing Phys: Debbe Odea MD  Sonographer Comments: Technically difficult study due to poor echo windows, no apical window and no subcostal window. Image acquisition challenging due to COPD and Pt contracted lying on right side -could not be positioned for better views IMPRESSIONS  1. Limited echo windows to assess EF. systolic function probably mildly reduced/low normal. Left  ventricular ejection fraction, by estimation, is 50%. The left ventricle has normal function. Left ventricular endocardial border not optimally defined to evaluate regional wall motion. Left ventricular diastolic function could not be evaluated.  2. Right ventricular systolic function was not well visualized. The right ventricular size is not well visualized.  3. The mitral valve is grossly normal. No evidence of mitral valve regurgitation.  4. The aortic valve was not well visualized. Aortic valve regurgitation is not visualized. FINDINGS  Left Ventricle: Limited echo windows to assess EF. systolic function probably mildly reduced/low normal. Left ventricular ejection fraction, by estimation, is 50%. The left ventricle has normal function. Left ventricular endocardial border not optimally  defined to evaluate regional wall motion. The left ventricular internal cavity size was normal in size. Suboptimal image quality limits for assessment of left ventricular hypertrophy. Left ventricular diastolic function could not be evaluated. Right Ventricle: The right ventricular size is not well visualized. Right vetricular wall thickness was not well visualized. Right ventricular systolic function was not well visualized. Left Atrium: Left atrial size was normal in size. Right Atrium: Right atrial size was not well visualized. Pericardium: There is no evidence of pericardial effusion. Mitral Valve: The mitral valve is grossly normal. No evidence of mitral valve regurgitation. Tricuspid Valve: The tricuspid valve is not well visualized. Tricuspid valve regurgitation is not demonstrated. Aortic Valve: The aortic valve was not well visualized. Aortic valve regurgitation is not visualized. Pulmonic Valve: The pulmonic valve was not well visualized. Pulmonic valve regurgitation is not visualized. Aorta: The aortic root was not well visualized. Venous: The inferior vena cava was not well visualized. IAS/Shunts: The interatrial  septum was not assessed.  LEFT VENTRICLE PLAX 2D LVIDd:         3.50 cm LVIDs:         2.70 cm LV PW:         1.10 cm LV IVS:        1.20 cm LVOT diam:     2.10 cm LVOT Area:     3.46 cm  LEFT ATRIUM         Index LA diam:    2.80 cm 1.88 cm/m   AORTA Ao Root diam: 2.50 cm TRICUSPID VALVE TR Peak grad:   1.1 mmHg  TR Vmax:        52.40 cm/s  SHUNTS Systemic Diam: 2.10 cm Debbe Odea MD Electronically signed by Debbe Odea MD Signature Date/Time: 07/31/2022/1:15:55 PM    Final    CT HEAD WO CONTRAST ( )  Result Date: 07/30/2022 CLINICAL DATA:  Mental status change EXAM: CT HEAD WITHOUT CONTRAST TECHNIQUE: Contiguous axial images were obtained from the base of the skull through the vertex without intravenous contrast. RADIATION DOSE REDUCTION: This exam was performed according to the departmental dose-optimization program which includes automated exposure control, adjustment of the mA and/or kV according to patient size and/or use of iterative reconstruction technique. COMPARISON:  MRI 05/29/2019, CT brain 09/11/2020 FINDINGS: Brain: No acute territorial infarction, hemorrhage or intracranial mass. Extensive confluent white matter hypodensity. Generalized atrophy. Stable ventricle size Vascular: No hyperdense vessels.  Carotid vascular disease Skull: Normal. Negative for fracture or focal lesion. Sinuses/Orbits: No acute finding. Other: None IMPRESSION: 1. No CT evidence for acute intracranial abnormality. 2. Atrophy and extensive chronic small vessel ischemic changes of the white matter. Electronically Signed   By: Jasmine Pang M.D.   On: 07/30/2022 21:08   DG Pelvis Portable  Result Date: 07/29/2022 CLINICAL DATA:  Leg appears shortened EXAM: PORTABLE PELVIS 1-2 VIEWS COMPARISON:  Pelvis radiographs 10/10/2018 FINDINGS: Postsurgical changes reflecting bilateral hip arthroplasty are noted. Hardware alignment is within expected limits, without evidence of hardware related complication. There is  no perihardware fracture. The SI joints and symphysis pubis are intact. The soft tissues are unremarkable. IMPRESSION: Status post bilateral hip arthroplasty without evidence of complication. No acute finding. Electronically Signed   By: Lesia Hausen M.D.   On: 07/29/2022 09:12   DG Chest Port 1 View  Result Date: 07/29/2022 CLINICAL DATA:  Questionable sepsis. EXAM: PORTABLE CHEST 1 VIEW COMPARISON:  AP Lat chest 12/11/2021, CT chest no contrast 05/30/2021. FINDINGS: Volume loss is again noted from old partial right pneumonectomy, with ipsilateral mediastinal shift and chronic paramediastinal fibrosis. There is increased opacity in the lower third of the right chest which appears consistent with at least a small or possibly moderate size pleural effusion. Possibility of overlying consolidation and recurrent tumor is considered. The lungs are otherwise generally clear with COPD change. The cardiac size is normal, shifted to the right chest as before. No vascular congestion is seen. The mediastinal configuration appears stable. There is mild thoracic levoscoliosis, osteopenia and degenerative change. No new osseous abnormality. IMPRESSION: 1. Increased opacity in the lower third of the right chest which appears consistent with at least a small or possibly moderate size pleural effusion. Possibility of overlying consolidation and recurrent tumor is considered. 2. COPD and chronic paramediastinal fibrosis. Electronically Signed   By: Almira Bar M.D.   On: 07/29/2022 07:27      Labs: BNP (last 3 results) Recent Labs    07/29/22 0656  BNP 2,190.5*   Basic Metabolic Panel: Recent Labs  Lab 07/29/22 0656 07/30/22 0336 07/31/22 0447 08/01/22 0631  NA 138 141 139 137  K 3.9 3.3* 2.5* 3.9  CL 106 107 107 106  CO2 20* 18* 21* 22  GLUCOSE 119* 133* 151* 109*  BUN 52* 53* 56* 68*  CREATININE 1.41* 1.29* 1.37* 1.26*  CALCIUM 9.9 8.9 8.9 8.9  MG  --   --  1.8 1.8   Liver Function Tests: Recent  Labs  Lab 07/29/22 0656  AST 23  ALT 13  ALKPHOS 105  BILITOT 0.5  PROT 9.1*  ALBUMIN 2.6*   No results  for input(s): "LIPASE", "AMYLASE" in the last 168 hours. No results for input(s): "AMMONIA" in the last 168 hours. CBC: Recent Labs  Lab 07/29/22 0656 07/30/22 0336 07/30/22 1618 07/31/22 0447 08/01/22 0631  WBC 26.3* 17.9*  --  28.7* 28.3*  NEUTROABS 22.9*  --   --   --   --   HGB 11.4* 8.6* 8.2* 8.2* 8.6*  HCT 38.2 28.9* 26.9* 26.1* 27.3*  MCV 76.2* 75.3*  --  72.5* 72.8*  PLT 370 216  --  216 197   Cardiac Enzymes: No results for input(s): "CKTOTAL", "CKMB", "CKMBINDEX", "TROPONINI" in the last 168 hours. BNP: Invalid input(s): "POCBNP" CBG: No results for input(s): "GLUCAP" in the last 168 hours. D-Dimer No results for input(s): "DDIMER" in the last 72 hours. Hgb A1c No results for input(s): "HGBA1C" in the last 72 hours. Lipid Profile No results for input(s): "CHOL", "HDL", "LDLCALC", "TRIG", "CHOLHDL", "LDLDIRECT" in the last 72 hours. Thyroid function studies No results for input(s): "TSH", "T4TOTAL", "T3FREE", "THYROIDAB" in the last 72 hours.  Invalid input(s): "FREET3" Anemia work up No results for input(s): "VITAMINB12", "FOLATE", "FERRITIN", "TIBC", "IRON", "RETICCTPCT" in the last 72 hours. Urinalysis    Component Value Date/Time   COLORURINE YELLOW (A) 07/29/2022 0835   APPEARANCEUR TURBID (A) 07/29/2022 0835   LABSPEC 1.017 07/29/2022 0835   PHURINE 7.0 07/29/2022 0835   GLUCOSEU NEGATIVE 07/29/2022 0835   GLUCOSEU NEGATIVE 03/03/2017 1456   HGBUR SMALL (A) 07/29/2022 0835   BILIRUBINUR NEGATIVE 07/29/2022 0835   BILIRUBINUR negative 04/09/2017 1107   KETONESUR 5 (A) 07/29/2022 0835   PROTEINUR >=300 (A) 07/29/2022 0835   UROBILINOGEN 0.2 04/09/2017 1107   UROBILINOGEN 0.2 03/03/2017 1456   NITRITE NEGATIVE 07/29/2022 0835   LEUKOCYTESUR MODERATE (A) 07/29/2022 0835   Sepsis Labs Recent Labs  Lab 07/29/22 0656 07/30/22 0336  07/31/22 0447 08/01/22 0631  WBC 26.3* 17.9* 28.7* 28.3*   Microbiology Recent Results (from the past 240 hour(s))  Urine Culture (for pregnant, neutropenic or urologic patients or patients with an indwelling urinary catheter)     Status: Abnormal   Collection Time: 07/27/22 10:16 AM   Specimen: Urine, Clean Catch  Result Value Ref Range Status   Specimen Description   Final    URINE, CLEAN CATCH Performed at [facility], [address], [place], [place] [address]    Special Requests   Final    NONE Performed at [facility], [address], [place], [place] [address]    Culture >=100,000 COLONIES/mL PROTEUS MIRABILIS (A)  Final   Report Status 07/29/2022 FINAL  Final   Organism ID, Bacteria PROTEUS MIRABILIS (A)  Final      Susceptibility   Proteus mirabilis - MIC*    AMPICILLIN <=2 SENSITIVE Sensitive     CEFAZOLIN <=4 SENSITIVE Sensitive     CEFEPIME <=0.12 SENSITIVE Sensitive     CEFTRIAXONE <=0.25 SENSITIVE Sensitive     CIPROFLOXACIN <=0.25 SENSITIVE Sensitive     GENTAMICIN <=1 SENSITIVE Sensitive     IMIPENEM 2 SENSITIVE Sensitive     NITROFURANTOIN 128 RESISTANT Resistant     TRIMETH/SULFA <=20 SENSITIVE Sensitive     AMPICILLIN/SULBACTAM <=2 SENSITIVE Sensitive     PIP/TAZO <=4 SENSITIVE Sensitive     * >=100,000 COLONIES/mL PROTEUS MIRABILIS  Blood Culture (routine x 2)     Status: Abnormal   Collection Time: 07/29/22  6:56 AM   Specimen: BLOOD RIGHT ARM  Result Value Ref Range Status   Specimen Description  Final    BLOOD RIGHT ARM Performed at [facility], [address] [place], [place] [address]    Special Requests   Final    BOTTLES DRAWN AEROBIC AND ANAEROBIC Blood Culture results may not be optimal due to an inadequate volume of blood received in culture bottles Performed at [facility], [address] [place], [place] [address]    Culture  Setup Time   Final    GRAM NEGATIVE RODS AEROBIC BOTTLE  ONLY CRITICAL RESULT CALLED TO, READ BACK BY AND VERIFIED WITH: Kevin Fenton PATEL 07/30/22 0647 KLW Performed at [facility], [address] [place], [place] [address]    Culture PROTEUS MIRABILIS (A)  Final   Report Status 08/01/2022 FINAL  Final   Organism ID, Bacteria PROTEUS MIRABILIS  Final      Susceptibility   Proteus mirabilis - MIC*    AMPICILLIN <=2 SENSITIVE Sensitive     CEFEPIME <=0.12 SENSITIVE Sensitive     CEFTAZIDIME <=1 SENSITIVE Sensitive     CEFTRIAXONE <=0.25 SENSITIVE Sensitive     CIPROFLOXACIN <=0.25 SENSITIVE Sensitive     GENTAMICIN <=1 SENSITIVE Sensitive     IMIPENEM 2 SENSITIVE Sensitive     TRIMETH/SULFA <=20 SENSITIVE Sensitive     AMPICILLIN/SULBACTAM <=2 SENSITIVE Sensitive     PIP/TAZO <=4 SENSITIVE Sensitive     * PROTEUS MIRABILIS  Blood Culture ID Panel (Reflexed)     Status: Abnormal   Collection Time: 07/29/22  6:56 AM  Result Value Ref Range Status   Enterococcus faecalis NOT DETECTED NOT DETECTED Final   Enterococcus Faecium NOT DETECTED NOT DETECTED Final   Listeria monocytogenes NOT DETECTED NOT DETECTED Final   Staphylococcus species NOT DETECTED NOT DETECTED Final   Staphylococcus aureus (BCID) NOT DETECTED NOT DETECTED Final   Staphylococcus epidermidis NOT DETECTED NOT DETECTED Final   Staphylococcus lugdunensis NOT DETECTED NOT DETECTED Final   Streptococcus species NOT DETECTED NOT DETECTED Final   Streptococcus agalactiae NOT DETECTED NOT DETECTED Final   Streptococcus pneumoniae NOT DETECTED NOT DETECTED Final   Streptococcus pyogenes NOT DETECTED NOT DETECTED Final   A.calcoaceticus-baumannii NOT DETECTED NOT DETECTED Final   Bacteroides fragilis NOT DETECTED NOT DETECTED Final   Enterobacterales DETECTED (A) NOT DETECTED Final    Comment: Enterobacterales represent a large order of gram negative bacteria, not a single organism. CRITICAL RESULT CALLED TO, READ BACK BY AND VERIFIED WITH: Kevin Fenton PATEL 07/30/22 0647 KLW     Enterobacter cloacae complex NOT DETECTED NOT DETECTED Final   Escherichia coli NOT DETECTED NOT DETECTED Final   Klebsiella aerogenes NOT DETECTED NOT DETECTED Final   Klebsiella oxytoca NOT DETECTED NOT DETECTED Final   Klebsiella pneumoniae NOT DETECTED NOT DETECTED Final   Proteus species DETECTED (A) NOT DETECTED Final    Comment: CRITICAL RESULT CALLED TO, READ BACK BY AND VERIFIED WITH: Kevin Fenton PATEL 07/30/22 0647 KLW    Salmonella species NOT DETECTED NOT DETECTED Final   Serratia marcescens NOT DETECTED NOT DETECTED Final   Haemophilus influenzae NOT DETECTED NOT DETECTED Final   Neisseria meningitidis NOT DETECTED NOT DETECTED Final   Pseudomonas aeruginosa NOT DETECTED NOT DETECTED Final   Stenotrophomonas maltophilia NOT DETECTED NOT DETECTED Final   Candida albicans NOT DETECTED NOT DETECTED Final   Candida auris NOT DETECTED NOT DETECTED Final   Candida glabrata NOT DETECTED NOT DETECTED Final   Candida krusei NOT DETECTED NOT DETECTED Final   Candida parapsilosis NOT DETECTED NOT DETECTED Final   Candida tropicalis  NOT DETECTED NOT DETECTED Final   Cryptococcus neoformans/gattii NOT DETECTED NOT DETECTED Final   CTX-M ESBL NOT DETECTED NOT DETECTED Final   Carbapenem resistance IMP NOT DETECTED NOT DETECTED Final   Carbapenem resistance KPC NOT DETECTED NOT DETECTED Final   Carbapenem resistance NDM NOT DETECTED NOT DETECTED Final   Carbapenem resist OXA 48 LIKE NOT DETECTED NOT DETECTED Final   Carbapenem resistance VIM NOT DETECTED NOT DETECTED Final    Comment: Performed at [facility], [address] [place], [place] [address]  Blood Culture (routine x 2)     Status: Abnormal   Collection Time: 07/29/22  7:51 AM   Specimen: BLOOD  Result Value Ref Range Status   Specimen Description   Final    BLOOD BLOOD LEFT HAND Performed at [facility], [address], [place], [place] [address]    Special Requests   Final    BOTTLES DRAWN AEROBIC AND  ANAEROBIC Blood Culture adequate volume Performed at [facility], [address] [place], [place] [address]    Culture  Setup Time   Final    AEROBIC BOTTLE ONLY GRAM POSITIVE COCCI CRITICAL RESULT CALLED TO, READ BACK BY AND VERIFIED WITH: DEVAN MITCHELL 07/31/22 0853 KLW    Culture (A)  Final    STAPHYLOCOCCUS CAPITIS THE SIGNIFICANCE OF ISOLATING THIS ORGANISM FROM A SINGLE SET OF BLOOD CULTURES WHEN MULTIPLE SETS ARE DRAWN IS UNCERTAIN. PLEASE NOTIFY THE MICROBIOLOGY DEPARTMENT WITHIN ONE WEEK IF SPECIATION AND SENSITIVITIES ARE REQUIRED. Performed at [facility], [address], [place], [place] [address]    Report Status 08/01/2022 FINAL  Final  Blood Culture ID Panel (Reflexed)     Status: Abnormal   Collection Time: 07/29/22  7:51 AM  Result Value Ref Range Status   Enterococcus faecalis NOT DETECTED NOT DETECTED Final   Enterococcus Faecium NOT DETECTED NOT DETECTED Final   Listeria monocytogenes NOT DETECTED NOT DETECTED Final   Staphylococcus species DETECTED (A) NOT DETECTED Final    Comment: CRITICAL RESULT CALLED TO, READ BACK BY AND VERIFIED WITH: DEVAN MITCHELL 07/31/22 0853 KLW    Staphylococcus aureus (BCID) NOT DETECTED NOT DETECTED Final   Staphylococcus epidermidis NOT DETECTED NOT DETECTED Final   Staphylococcus lugdunensis NOT DETECTED NOT DETECTED Final   Streptococcus species NOT DETECTED NOT DETECTED Final   Streptococcus agalactiae NOT DETECTED NOT DETECTED Final   Streptococcus pneumoniae NOT DETECTED NOT DETECTED Final   Streptococcus pyogenes NOT DETECTED NOT DETECTED Final   A.calcoaceticus-baumannii NOT DETECTED NOT DETECTED Final   Bacteroides fragilis NOT DETECTED NOT DETECTED Final   Enterobacterales NOT DETECTED NOT DETECTED Final   Enterobacter cloacae complex NOT DETECTED NOT DETECTED Final   Escherichia coli NOT DETECTED NOT DETECTED Final   Klebsiella aerogenes NOT DETECTED NOT DETECTED Final   Klebsiella oxytoca NOT  DETECTED NOT DETECTED Final   Klebsiella pneumoniae NOT DETECTED NOT DETECTED Final   Proteus species NOT DETECTED NOT DETECTED Final   Salmonella species NOT DETECTED NOT DETECTED Final   Serratia marcescens NOT DETECTED NOT DETECTED Final   Haemophilus influenzae NOT DETECTED NOT DETECTED Final   Neisseria meningitidis NOT DETECTED NOT DETECTED Final   Pseudomonas aeruginosa NOT DETECTED NOT DETECTED Final   Stenotrophomonas maltophilia NOT DETECTED NOT DETECTED Final   Candida albicans NOT DETECTED NOT DETECTED Final   Candida auris NOT DETECTED NOT DETECTED Final   Candida glabrata NOT DETECTED NOT DETECTED Final   Candida krusei NOT DETECTED NOT DETECTED Final  Candida parapsilosis NOT DETECTED NOT DETECTED Final   Candida tropicalis NOT DETECTED NOT DETECTED Final   Cryptococcus neoformans/gattii NOT DETECTED NOT DETECTED Final    Comment: Performed at [facility], [address] [address] [place], [place] [address]  Urine Culture     Status: Abnormal   Collection Time: 07/29/22  8:35 AM   Specimen: Urine, Random  Result Value Ref Range Status   Specimen Description   Final    URINE, RANDOM Performed at [facility], [address], [place], [place] [address]    Special Requests   Final    NONE Reflexed from [id number] Performed at [facility], [address] [place], [place] [address]    Culture MULTIPLE SPECIES PRESENT, SUGGEST RECOLLECTION (A)  Final   Report Status 07/30/2022 FINAL  Final  Resp panel by RT-PCR (RSV, Flu A&B, Covid) Anterior Nasal Swab     Status: None   Collection Time: 07/29/22  8:36 AM   Specimen: Anterior Nasal Swab  Result Value Ref Range Status   SARS Coronavirus 2 by RT PCR NEGATIVE NEGATIVE Final    Comment: (NOTE) SARS-CoV-2 target nucleic acids are NOT DETECTED.  The SARS-CoV-2 RNA is generally detectable in upper respiratory specimens during the acute phase of infection. The lowest concentration of SARS-CoV-2 viral  copies this assay can detect is 138 copies/mL. A negative result does not preclude SARS-Cov-2 infection and should not be used as the sole basis for treatment or other patient management decisions. A negative result may occur with  improper specimen collection/handling, submission of specimen other than nasopharyngeal swab, presence of viral mutation(s) within the areas targeted by this assay, and inadequate number of viral copies(<138 copies/mL). A negative result must be combined with clinical observations, patient history, and epidemiological information. The expected result is Negative.  Fact Sheet for Patients:  BloggerCourse.com  Fact Sheet for Healthcare Providers:  SeriousBroker.it  This test is no t yet approved or cleared by the [place] FDA and  has been authorized for detection and/or diagnosis of SARS-CoV-2 by FDA under an Emergency Use Authorization (EUA). This EUA will remain  in effect (meaning this test can be used) for the duration of the COVID-19 declaration under Section 564(b)(1) of the Act, 21 U.S.C.section 360bbb-3(b)(1), unless the authorization is terminated  or revoked sooner.       Influenza A by PCR NEGATIVE NEGATIVE Final   Influenza B by PCR NEGATIVE NEGATIVE Final    Comment: (NOTE) The Xpert Xpress SARS-CoV-2/FLU/RSV plus assay is intended as an aid in the diagnosis of influenza from Nasopharyngeal swab specimens and should not be used as a sole basis for treatment. Nasal washings and aspirates are unacceptable for Xpert Xpress SARS-CoV-2/FLU/RSV testing.  Fact Sheet for Patients: BloggerCourse.com  Fact Sheet for Healthcare Providers: SeriousBroker.it  This test is not yet approved or cleared by the [place] FDA and has been authorized for detection and/or diagnosis of SARS-CoV-2 by FDA under an Emergency Use Authorization (EUA). This  EUA will remain in effect (meaning this test can be used) for the duration of the COVID-19 declaration under Section 564(b)(1) of the Act, 21 U.S.C. section 360bbb-3(b)(1), unless the authorization is terminated or revoked.     Resp Syncytial Virus by PCR NEGATIVE NEGATIVE Final    Comment: (NOTE) Fact Sheet for Patients: BloggerCourse.com  Fact Sheet for Healthcare Providers: SeriousBroker.it  This test is not yet approved or cleared by the [place] FDA and has been authorized for detection and/or diagnosis  of SARS-CoV-2 by FDA under an Emergency Use Authorization (EUA). This EUA will remain in effect (meaning this test can be used) for the duration of the COVID-19 declaration under Section 564(b)(1) of the Act, 21 U.S.C. section 360bbb-3(b)(1), unless the authorization is terminated or revoked.  Performed at [facility], [address] [place], [place] [address]   MRSA Next Gen by PCR, Nasal     Status: None   Collection Time: 07/29/22  6:38 PM   Specimen: Nasal Mucosa; Nasal Swab  Result Value Ref Range Status   MRSA by PCR Next Gen NOT DETECTED NOT DETECTED Final    Comment: (NOTE) The GeneXpert MRSA Assay (FDA approved for NASAL specimens only), is one component of a comprehensive MRSA colonization surveillance program. It is not intended to diagnose MRSA infection nor to guide or monitor treatment for MRSA infections. Test performance is not FDA approved in patients less than 58 years old. Performed at [facility], [address] [place], [place] [address]      Total time spend on discharging this patient, including the last patient exam, discussing the hospital stay, instructions for ongoing care as it relates to all pertinent caregivers, as well as preparing the medical discharge records, prescriptions, and/or referrals as applicable, is 30 minutes.    Darlin Priestly, MD  [facility] 08/02/2022, 11:17 AM

## 2022-08-02 NOTE — TOC Transition Note (Signed)
Transition of Care [facility]) - CM/SW Discharge Note   Patient Details  Name: Robyn Barton MRN: [id number] Date of Birth: 03/13/1945  [facility] CM/SW Contact:  Darolyn Rua, LCSW Phone Number: 08/02/2022, 11:22 AM   Clinical Narrative:     Patient to dc to [facility] via [facility], RN to call report to the Hospice Home at [phone]   No further dc needs.   Final next level of care: Hospice Medical Facility Barriers to Discharge: No Barriers Identified   Patient Goals and CMS Choice CMS Medicare.gov Compare Post Acute Care list provided to:: Patient Represenative (must comment) Choice offered to / list presented to : [facility] POA / Guardian  Discharge Placement                         Discharge Plan and Services Additional resources added to the After Visit Summary for                                       Social Determinants of Health (SDOH) Interventions SDOH Screenings   Food Insecurity: Low Risk  (12/31/2021)   Received from [facility], [facility]  Transportation Needs: No Transportation Needs (12/31/2021)   Received from [facility], [facility]  Utilities: Low Risk  (12/31/2021)   Received from [facility], [facility]  Social Connections: Unknown (05/27/2021)   Received from [facility], [facility]  Tobacco Use: Medium Risk (03/16/2022)   [facility]     Readmission Risk Interventions     No data to display

## 2022-08-02 NOTE — Progress Notes (Addendum)
[facility]- Civil engineer, contracting [facility])    Received request from Transitions of Care Manger for family interest in [facility].  Eligibility has been confirmed.  Spoke with patient's family to confirm interest and explain services.  Family agreeable to transfer today.  Transitions of Care manager aware.  RN please call report to [phone] [facility]) prior to patient leaving the unit.  Please send signed and completed DNR with patient at discharge.  Thank you  Redge Gainer,  [facility] Liaison [phone]

## 2022-08-15 DEATH — deceased
